# Patient Record
Sex: Male | Born: 1955 | Race: Black or African American | Hispanic: No | Marital: Single | Smoking: Current every day smoker
Health system: Southern US, Community
[De-identification: ages and names within clinical notes are randomized; demographics above are authoritative.]

## PROBLEM LIST (undated history)

## (undated) DIAGNOSIS — F329 Major depressive disorder, single episode, unspecified: Secondary | ICD-10-CM

## (undated) DIAGNOSIS — F4310 Post-traumatic stress disorder, unspecified: Secondary | ICD-10-CM

## (undated) DIAGNOSIS — F32A Depression, unspecified: Secondary | ICD-10-CM

## (undated) DIAGNOSIS — T148XXA Other injury of unspecified body region, initial encounter: Secondary | ICD-10-CM

## (undated) DIAGNOSIS — I639 Cerebral infarction, unspecified: Secondary | ICD-10-CM

## (undated) DIAGNOSIS — G43909 Migraine, unspecified, not intractable, without status migrainosus: Secondary | ICD-10-CM

## (undated) DIAGNOSIS — I10 Essential (primary) hypertension: Secondary | ICD-10-CM

## (undated) HISTORY — PX: HIP SURGERY: SHX245

## (undated) HISTORY — PX: BACK SURGERY: SHX140

## (undated) HISTORY — PX: HEMORROIDECTOMY: SUR656

---

## 1998-04-05 ENCOUNTER — Emergency Department (HOSPITAL_COMMUNITY): Admission: EM | Admit: 1998-04-05 | Discharge: 1998-04-05 | Payer: Self-pay | Admitting: Internal Medicine

## 1998-04-05 ENCOUNTER — Encounter: Payer: Self-pay | Admitting: Internal Medicine

## 1998-07-13 ENCOUNTER — Emergency Department (HOSPITAL_COMMUNITY): Admission: EM | Admit: 1998-07-13 | Discharge: 1998-07-13 | Payer: Self-pay | Admitting: Emergency Medicine

## 1998-08-02 ENCOUNTER — Emergency Department (HOSPITAL_COMMUNITY): Admission: EM | Admit: 1998-08-02 | Discharge: 1998-08-02 | Payer: Self-pay | Admitting: Emergency Medicine

## 1998-08-26 ENCOUNTER — Emergency Department (HOSPITAL_COMMUNITY): Admission: EM | Admit: 1998-08-26 | Discharge: 1998-08-26 | Payer: Self-pay | Admitting: Emergency Medicine

## 1998-09-01 ENCOUNTER — Emergency Department (HOSPITAL_COMMUNITY): Admission: EM | Admit: 1998-09-01 | Discharge: 1998-09-01 | Payer: Self-pay | Admitting: Internal Medicine

## 1998-09-06 ENCOUNTER — Emergency Department (HOSPITAL_COMMUNITY): Admission: EM | Admit: 1998-09-06 | Discharge: 1998-09-06 | Payer: Self-pay | Admitting: Internal Medicine

## 1998-09-06 ENCOUNTER — Encounter: Payer: Self-pay | Admitting: Internal Medicine

## 1999-03-07 ENCOUNTER — Emergency Department (HOSPITAL_COMMUNITY): Admission: EM | Admit: 1999-03-07 | Discharge: 1999-03-07 | Payer: Self-pay | Admitting: Emergency Medicine

## 1999-06-24 ENCOUNTER — Emergency Department (HOSPITAL_COMMUNITY): Admission: EM | Admit: 1999-06-24 | Discharge: 1999-06-24 | Payer: Self-pay | Admitting: Emergency Medicine

## 1999-06-24 ENCOUNTER — Encounter: Payer: Self-pay | Admitting: Emergency Medicine

## 1999-10-13 ENCOUNTER — Emergency Department (HOSPITAL_COMMUNITY): Admission: EM | Admit: 1999-10-13 | Discharge: 1999-10-13 | Payer: Self-pay | Admitting: Emergency Medicine

## 1999-10-13 ENCOUNTER — Encounter: Payer: Self-pay | Admitting: Emergency Medicine

## 2000-07-13 ENCOUNTER — Emergency Department (HOSPITAL_COMMUNITY): Admission: EM | Admit: 2000-07-13 | Discharge: 2000-07-13 | Payer: Self-pay | Admitting: Emergency Medicine

## 2000-10-26 ENCOUNTER — Emergency Department (HOSPITAL_COMMUNITY): Admission: EM | Admit: 2000-10-26 | Discharge: 2000-10-27 | Payer: Self-pay | Admitting: Emergency Medicine

## 2000-10-27 ENCOUNTER — Encounter: Payer: Self-pay | Admitting: Emergency Medicine

## 2000-11-08 ENCOUNTER — Emergency Department (HOSPITAL_COMMUNITY): Admission: EM | Admit: 2000-11-08 | Discharge: 2000-11-08 | Payer: Self-pay

## 2000-12-18 ENCOUNTER — Emergency Department (HOSPITAL_COMMUNITY): Admission: EM | Admit: 2000-12-18 | Discharge: 2000-12-18 | Payer: Self-pay | Admitting: Internal Medicine

## 2001-04-26 ENCOUNTER — Emergency Department (HOSPITAL_COMMUNITY): Admission: EM | Admit: 2001-04-26 | Discharge: 2001-04-26 | Payer: Self-pay | Admitting: Emergency Medicine

## 2001-04-26 ENCOUNTER — Encounter: Payer: Self-pay | Admitting: Emergency Medicine

## 2001-04-27 ENCOUNTER — Emergency Department (HOSPITAL_COMMUNITY): Admission: EM | Admit: 2001-04-27 | Discharge: 2001-04-27 | Payer: Self-pay | Admitting: Emergency Medicine

## 2001-05-01 ENCOUNTER — Emergency Department (HOSPITAL_COMMUNITY): Admission: EM | Admit: 2001-05-01 | Discharge: 2001-05-01 | Payer: Self-pay | Admitting: Emergency Medicine

## 2001-05-01 ENCOUNTER — Encounter: Payer: Self-pay | Admitting: Emergency Medicine

## 2001-06-04 ENCOUNTER — Emergency Department (HOSPITAL_COMMUNITY): Admission: EM | Admit: 2001-06-04 | Discharge: 2001-06-04 | Payer: Self-pay | Admitting: Emergency Medicine

## 2001-06-04 ENCOUNTER — Encounter: Payer: Self-pay | Admitting: Psychology

## 2002-01-02 ENCOUNTER — Emergency Department (HOSPITAL_COMMUNITY): Admission: EM | Admit: 2002-01-02 | Discharge: 2002-01-02 | Payer: Self-pay | Admitting: Emergency Medicine

## 2002-09-08 ENCOUNTER — Emergency Department (HOSPITAL_COMMUNITY): Admission: EM | Admit: 2002-09-08 | Discharge: 2002-09-09 | Payer: Self-pay | Admitting: Emergency Medicine

## 2002-10-23 ENCOUNTER — Emergency Department (HOSPITAL_COMMUNITY): Admission: EM | Admit: 2002-10-23 | Discharge: 2002-10-23 | Payer: Self-pay | Admitting: Emergency Medicine

## 2003-01-28 ENCOUNTER — Emergency Department (HOSPITAL_COMMUNITY): Admission: EM | Admit: 2003-01-28 | Discharge: 2003-01-28 | Payer: Self-pay | Admitting: Emergency Medicine

## 2003-10-19 ENCOUNTER — Encounter: Admission: RE | Admit: 2003-10-19 | Discharge: 2003-10-19 | Payer: Self-pay | Admitting: Internal Medicine

## 2003-12-11 ENCOUNTER — Emergency Department (HOSPITAL_COMMUNITY): Admission: EM | Admit: 2003-12-11 | Discharge: 2003-12-11 | Payer: Self-pay | Admitting: Emergency Medicine

## 2004-01-20 ENCOUNTER — Encounter: Admission: RE | Admit: 2004-01-20 | Discharge: 2004-01-20 | Payer: Self-pay | Admitting: Specialist

## 2004-03-05 ENCOUNTER — Emergency Department (HOSPITAL_COMMUNITY): Admission: EM | Admit: 2004-03-05 | Discharge: 2004-03-05 | Payer: Self-pay | Admitting: Emergency Medicine

## 2004-04-07 ENCOUNTER — Emergency Department (HOSPITAL_COMMUNITY): Admission: EM | Admit: 2004-04-07 | Discharge: 2004-04-07 | Payer: Self-pay | Admitting: Emergency Medicine

## 2004-05-18 ENCOUNTER — Emergency Department (HOSPITAL_COMMUNITY): Admission: EM | Admit: 2004-05-18 | Discharge: 2004-05-18 | Payer: Self-pay | Admitting: Emergency Medicine

## 2004-05-23 ENCOUNTER — Emergency Department (HOSPITAL_COMMUNITY): Admission: EM | Admit: 2004-05-23 | Discharge: 2004-05-23 | Payer: Self-pay | Admitting: Emergency Medicine

## 2004-08-16 ENCOUNTER — Emergency Department (HOSPITAL_COMMUNITY): Admission: EM | Admit: 2004-08-16 | Discharge: 2004-08-16 | Payer: Self-pay | Admitting: Emergency Medicine

## 2004-08-17 ENCOUNTER — Emergency Department (HOSPITAL_COMMUNITY): Admission: EM | Admit: 2004-08-17 | Discharge: 2004-08-17 | Payer: Self-pay | Admitting: Emergency Medicine

## 2004-08-18 ENCOUNTER — Encounter: Admission: RE | Admit: 2004-08-18 | Discharge: 2004-08-18 | Payer: Self-pay | Admitting: Specialist

## 2004-08-24 ENCOUNTER — Encounter: Admission: RE | Admit: 2004-08-24 | Discharge: 2004-08-24 | Payer: Self-pay | Admitting: Specialist

## 2004-08-28 ENCOUNTER — Emergency Department (HOSPITAL_COMMUNITY): Admission: EM | Admit: 2004-08-28 | Discharge: 2004-08-28 | Payer: Self-pay | Admitting: Emergency Medicine

## 2004-11-20 ENCOUNTER — Emergency Department (HOSPITAL_COMMUNITY): Admission: EM | Admit: 2004-11-20 | Discharge: 2004-11-20 | Payer: Self-pay | Admitting: Emergency Medicine

## 2005-02-10 ENCOUNTER — Inpatient Hospital Stay (HOSPITAL_COMMUNITY): Admission: RE | Admit: 2005-02-10 | Discharge: 2005-02-13 | Payer: Self-pay | Admitting: Specialist

## 2005-03-28 ENCOUNTER — Emergency Department (HOSPITAL_COMMUNITY): Admission: EM | Admit: 2005-03-28 | Discharge: 2005-03-28 | Payer: Self-pay | Admitting: Emergency Medicine

## 2005-05-29 ENCOUNTER — Emergency Department (HOSPITAL_COMMUNITY): Admission: EM | Admit: 2005-05-29 | Discharge: 2005-05-29 | Payer: Self-pay | Admitting: Emergency Medicine

## 2005-12-22 ENCOUNTER — Emergency Department (HOSPITAL_COMMUNITY): Admission: EM | Admit: 2005-12-22 | Discharge: 2005-12-22 | Payer: Self-pay | Admitting: Emergency Medicine

## 2006-04-16 ENCOUNTER — Emergency Department (HOSPITAL_COMMUNITY): Admission: EM | Admit: 2006-04-16 | Discharge: 2006-04-16 | Payer: Self-pay | Admitting: Emergency Medicine

## 2007-11-14 ENCOUNTER — Emergency Department (HOSPITAL_COMMUNITY): Admission: EM | Admit: 2007-11-14 | Discharge: 2007-11-14 | Payer: Self-pay | Admitting: Emergency Medicine

## 2007-11-15 ENCOUNTER — Ambulatory Visit (HOSPITAL_COMMUNITY): Admission: RE | Admit: 2007-11-15 | Discharge: 2007-11-15 | Payer: Self-pay | Admitting: Emergency Medicine

## 2009-08-05 ENCOUNTER — Inpatient Hospital Stay (HOSPITAL_COMMUNITY): Admission: EM | Admit: 2009-08-05 | Discharge: 2009-08-12 | Payer: Self-pay | Admitting: Emergency Medicine

## 2009-08-06 ENCOUNTER — Ambulatory Visit: Payer: Self-pay | Admitting: Physical Medicine & Rehabilitation

## 2009-08-06 ENCOUNTER — Encounter (INDEPENDENT_AMBULATORY_CARE_PROVIDER_SITE_OTHER): Payer: Self-pay | Admitting: Internal Medicine

## 2009-08-06 ENCOUNTER — Ambulatory Visit: Payer: Self-pay | Admitting: Vascular Surgery

## 2009-08-08 ENCOUNTER — Encounter (INDEPENDENT_AMBULATORY_CARE_PROVIDER_SITE_OTHER): Payer: Self-pay | Admitting: Internal Medicine

## 2009-12-07 ENCOUNTER — Encounter: Payer: Self-pay | Admitting: Interventional Radiology

## 2010-01-06 ENCOUNTER — Encounter
Admission: RE | Admit: 2010-01-06 | Discharge: 2010-04-06 | Payer: Self-pay | Source: Home / Self Care | Admitting: Internal Medicine

## 2010-06-20 ENCOUNTER — Encounter: Payer: Self-pay | Admitting: Internal Medicine

## 2010-08-23 LAB — LIPID PANEL
Cholesterol: 163 mg/dL (ref 0–200)
HDL: 24 mg/dL — ABNORMAL LOW (ref >39)
Total CHOL/HDL Ratio: 6.8 RATIO
VLDL: 25 mg/dL (ref 0–40)

## 2010-08-23 LAB — BASIC METABOLIC PANEL
Calcium: 9.5 mg/dL (ref 8.4–10.5)
Creatinine, Ser: 1.21 mg/dL (ref 0.4–1.5)
GFR calc Af Amer: >60 mL/min (ref >60)
GFR calc non Af Amer: >60 mL/min (ref >60)
Sodium: 141 mEq/L (ref 135–145)

## 2010-08-23 LAB — HEMOGLOBIN A1C
Hgb A1c MFr Bld: 5.7 % (ref 4.6–6.1)
Mean Plasma Glucose: 117 mg/dL

## 2010-08-23 LAB — COMPREHENSIVE METABOLIC PANEL
ALT: 15 U/L (ref 0–53)
ALT: 20 U/L (ref 0–53)
AST: 28 U/L (ref 0–37)
Albumin: 3.6 g/dL (ref 3.5–5.2)
Alkaline Phosphatase: 100 U/L (ref 39–117)
Alkaline Phosphatase: 84 U/L (ref 39–117)
BUN: 8 mg/dL (ref 6–23)
CO2: 22 mEq/L (ref 19–32)
Calcium: 9.1 mg/dL (ref 8.4–10.5)
Chloride: 108 mEq/L (ref 96–112)
Creatinine, Ser: 1.07 mg/dL (ref 0.4–1.5)
GFR calc Af Amer: >60 mL/min (ref >60)
GFR calc non Af Amer: >60 mL/min (ref >60)
Glucose, Bld: 120 mg/dL — ABNORMAL HIGH (ref 70–99)
Potassium: 3.7 mEq/L (ref 3.5–5.1)
Potassium: 4.2 mEq/L (ref 3.5–5.1)
Sodium: 139 mEq/L (ref 135–145)
Sodium: 142 mEq/L (ref 135–145)
Total Protein: 6.2 g/dL (ref 6.0–8.3)

## 2010-08-23 LAB — CK TOTAL AND CKMB (NOT AT ARMC)
CK, MB: 0.8 ng/mL (ref 0.3–4.0)
Relative Index: 0.5 (ref 0.0–2.5)
Total CK: 164 U/L (ref 7–232)

## 2010-08-23 LAB — DIFFERENTIAL
Basophils Absolute: 0.1 10*3/uL (ref 0.0–0.1)
Basophils Relative: 1 % (ref 0–1)
Eosinophils Relative: 4 % (ref 0–5)
Monocytes Absolute: 0.7 10*3/uL (ref 0.1–1.0)

## 2010-08-23 LAB — TROPONIN I: Troponin I: <0.01 ng/mL (ref 0.00–0.06)

## 2010-08-23 LAB — CBC
HCT: 44.7 % (ref 39.0–52.0)
Hemoglobin: 14.7 g/dL (ref 13.0–17.0)
Hemoglobin: 16.7 g/dL (ref 13.0–17.0)
MCHC: 33.9 g/dL (ref 30.0–36.0)
MCV: 96.2 fL (ref 78.0–100.0)
RBC: 4.42 MIL/uL (ref 4.22–5.81)
RBC: 5.12 MIL/uL (ref 4.22–5.81)
RDW: 14.2 % (ref 11.5–15.5)
WBC: 6.2 10*3/uL (ref 4.0–10.5)

## 2010-08-23 LAB — RPR: RPR Ser Ql: NONREACTIVE

## 2010-08-23 LAB — TSH: TSH: 1.128 u[IU]/mL (ref 0.350–4.500)

## 2010-08-23 LAB — HIV ANTIBODY (ROUTINE TESTING W REFLEX): HIV: NONREACTIVE

## 2010-08-23 LAB — GLUCOSE, CAPILLARY
Glucose-Capillary: 107 mg/dL — ABNORMAL HIGH (ref 70–99)
Glucose-Capillary: 112 mg/dL — ABNORMAL HIGH (ref 70–99)

## 2010-08-23 LAB — APTT: aPTT: 26 seconds (ref 24–37)

## 2010-08-23 LAB — PROTIME-INR
INR: 0.92 (ref 0.00–1.49)
Prothrombin Time: 12.3 seconds (ref 11.6–15.2)

## 2010-08-23 LAB — HOMOCYSTEINE: Homocysteine: 14.7 umol/L (ref 4.0–15.4)

## 2010-08-23 LAB — GC/CHLAMYDIA PROBE AMP, URINE: Chlamydia, Swab/Urine, PCR: NEGATIVE

## 2010-10-15 NOTE — Op Note (Signed)
NAMEABDULAHI, SCHOR NO.:  [id number]   MEDICAL RECORD NO.:  [id number]          PATIENT TYPE:  OIB   LOCATION:  1510                         FACILITY:  [facility]   PHYSICIAN:  Jene Every, M.D.    DATE OF BIRTH:  1956-02-25   DATE OF PROCEDURE:  02/10/2005  DATE OF DISCHARGE:                                 OPERATIVE REPORT   PREOPERATIVE DIAGNOSIS:  Spinal stenosis at 4-5.   POSTOPERATIVE DIAGNOSIS:  Spinal stenosis at 4-5.   PROCEDURE PERFORMED:  1.  Central decompression at 4-5 with bilateral lateral recess decompression      and foraminotomies at L4 and L5.  2.  Decompression at 3-4.   ANESTHESIA:  General.   SURGEON:  Jene Every, M.D.   ASSISTANT:  Roma Schanz, P.A.   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  He had an MRI and a myelogram recently demonstrating severe stenosis  at 4-5 and moderate at 3-4.  He was considered a candidate for a __________  , however, he recently developed pain in the seated as well as the standing  position therefore precluding that option.  Therefore, it was felt that  decompression at 4-5 was appropriate.  He had a previous disk protrusion at  4-5 but it was felt that due to his retrolisthesis that this was a  pseudodisc.  Operative intervention was indicated for central decompression  to adjust the severe stenosis at 4-5.  The risks and benefits were discussed  including bleeding, infection, CSF leakage, epidural fibrous disease,  progression of disease and need for fusion in the future, anesthetic  complications and he accepted.   The patient was placed in the supine after the induction of general  anesthesia and 1 gm of Kefzol.  He was placed prone on the Wilson frame,  Gratiot table.  All bony prominences were well padded.  All private regions  were padded.  The lumbar region was prepped  and draped in the usual sterile  fashion.  Two #18 gauge spinal needles were utilized to localize the 4-5  interspace and confirmed with x-ray.  Incision was made from the spinous  process of 3 to the spinous process of 5.  The subcutaneous tissues were  dissected.  Electrocautery was utilized to achieve hemostasis.  The  dorsolumbar fascia was identified in the bilateral skin incision.  The  paraspinous muscles were elevated to perform an island at 4-5 and partially  at 3.  A Cole retractor was placed, Kocher's were placed in the spinous  process of 4-5, confirmed with x-ray.  The spinous process of 4 and  partially of 5 was removed with the Leksell rongeur.  I used a 2 mm Kerrison  to enter the interlaminar space.  Severe central stenosis was noted.  We  removed the partial lamina of 4 with Leksell rongeur and then completed with  a 2 mm Kerrison and eventually the full lamina of 4 due to some severe  stenosis bilaterally and centrally.  The operating  microscope had been  draped and brought into the surgical field.  There was noted even with the  laminectomy at 4 that the ligamentum flavum was hypertrophied and stenotic  and 3-4 so we removed the ligamentum flavum from the 3-4 space as well.  Distally there was severe foraminal stenosis at 4-5 so foraminotomies at 5  were performed.  Hemilaminotomies bilaterally at 5 were performed as well to  fully decompression 4-5.  After the decompression there was good restoration  of the thecal sac.  We did not use any significant retraction of the thecal  sac.  The foramen of 4 and 5 were widely patent and there was a hard disk at  4-5 which was a pseudodisc with retrolisthesis, not ruptured and this was  examined on both sides at the right and to the left.  Bipolar electrocautery  was utilized to achieve hemostasis.  Following the full decompression there  was no CSF leakage or active bleeding.  Bone wax had been placed and the  cancellous  surfaces were copiously irrigated with antibiotic irrigation.  Thrombin-soaked was placed in the laminotomy defects.  Next a McCullough  retractor was placed in the paraspinous muscles to inspect and there was no  bleeding.  Hemovac was placed and brought out through a stab wound in the  skin.  The dorsolumbar fascia was reapproximated with #1 Vicryl interrupted  figure-of-eight sutures.  The subcutaneous tissues were reapproximated with  2-0 Vicryl subcuticular sutures and the skin was reapproximated with  staples.  The wound was dressed sterilely and he was placed supine on the  hospital bed, extubated without difficulty and transported to the recovery  room in satisfactory condition.   COMPLICATIONS:  None.   ESTIMATED BLOOD LOSS:  100 cc.      Jene Every, M.D.  Electronically Signed     JB/MEDQ  D:  02/10/2005  T:  02/10/2005  Job:  [id number]

## 2011-05-25 ENCOUNTER — Emergency Department (HOSPITAL_BASED_OUTPATIENT_CLINIC_OR_DEPARTMENT_OTHER)
Admission: EM | Admit: 2011-05-25 | Discharge: 2011-05-25 | Disposition: A | Discharge status: Home / Self Care | Payer: Medicaid Other | Attending: Emergency Medicine | Admitting: Emergency Medicine

## 2011-05-25 ENCOUNTER — Emergency Department (INDEPENDENT_AMBULATORY_CARE_PROVIDER_SITE_OTHER): Payer: Medicaid Other

## 2011-05-25 ENCOUNTER — Encounter: Payer: Self-pay | Admitting: *Deleted

## 2011-05-25 DIAGNOSIS — R05 Cough: Secondary | ICD-10-CM

## 2011-05-25 DIAGNOSIS — F17200 Nicotine dependence, unspecified, uncomplicated: Secondary | ICD-10-CM | POA: Insufficient documentation

## 2011-05-25 DIAGNOSIS — B9789 Other viral agents as the cause of diseases classified elsewhere: Secondary | ICD-10-CM | POA: Insufficient documentation

## 2011-05-25 DIAGNOSIS — M549 Dorsalgia, unspecified: Secondary | ICD-10-CM | POA: Insufficient documentation

## 2011-05-25 DIAGNOSIS — J3489 Other specified disorders of nose and nasal sinuses: Secondary | ICD-10-CM

## 2011-05-25 DIAGNOSIS — I10 Essential (primary) hypertension: Secondary | ICD-10-CM | POA: Insufficient documentation

## 2011-05-25 DIAGNOSIS — B349 Viral infection, unspecified: Secondary | ICD-10-CM

## 2011-05-25 DIAGNOSIS — Z8679 Personal history of other diseases of the circulatory system: Secondary | ICD-10-CM | POA: Insufficient documentation

## 2011-05-25 DIAGNOSIS — R059 Cough, unspecified: Secondary | ICD-10-CM | POA: Insufficient documentation

## 2011-05-25 HISTORY — DX: Essential (primary) hypertension: I10

## 2011-05-25 HISTORY — DX: Cerebral infarction, unspecified: I63.9

## 2011-05-25 MED ORDER — ALBUTEROL SULFATE HFA 108 (90 BASE) MCG/ACT IN AERS
2.0000 | INHALATION_SPRAY | Freq: Once | RESPIRATORY_TRACT | Status: AC
  Administered 2011-05-25: 2 via RESPIRATORY_TRACT
  Filled 2011-05-25: qty 6.7

## 2011-05-25 NOTE — ED Notes (Signed)
Pt c/o lower back  pain and cough and sinus congestion x 3 days

## 2011-05-25 NOTE — ED Provider Notes (Signed)
History     CSN: [id number]  Arrival date & time 05/25/11  4696   First MD Initiated Contact with Patient 05/25/11 2111     HPI  Reports one week of cough chest congestion. Associated with sinus congestion, and rhinorrhea which has since resolved. Reports positive sick contact as he is here with his daughter who had similar symptoms for approximately one week. Denies chest pain, shortness breath, fever, back, nausea, vomiting, diarrhea, abdominal pain. Patient is a 55 y.o. male presenting with cough. The history is provided by the patient.  Cough This is a new problem. The current episode started more than 1 week ago. The problem occurs constantly. The cough is non-productive. There has been no fever. Pertinent negatives include no chest pain, no chills, no ear pain, no headaches, no rhinorrhea, no sore throat, no myalgias and no shortness of breath. He has tried decongestants for the symptoms. The treatment provided no relief. He is a smoker. His past medical history does not include bronchitis, COPD or asthma.    Past Medical History  Diagnosis Date  . Hypertension   . CVA (cerebral infarction)     Past Surgical History  Procedure Date  . Back surgery   . Hemorroidectomy     History reviewed. No pertinent family history.  History  Substance Use Topics  . Smoking status: Current Everyday Smoker -- 0.5 packs/day    Types: Cigarettes  . Smokeless tobacco: Not on file  . Alcohol Use: No      Review of Systems  Constitutional: Positive for fatigue. Negative for fever and chills.  HENT: Positive for congestion. Negative for ear pain, sore throat, rhinorrhea, drooling, postnasal drip and sinus pressure.   Respiratory: Positive for cough. Negative for shortness of breath.   Cardiovascular: Negative for chest pain.  Gastrointestinal: Negative for nausea, vomiting, abdominal pain and diarrhea.  Musculoskeletal: Negative for myalgias and back pain.  Neurological: Negative for  dizziness, weakness and headaches.  All other systems reviewed and are negative.    Allergies  Review of patient's allergies indicates no known allergies.  Home Medications   Current Outpatient Rx  Name Route Sig Dispense Refill  . ASPIRIN EC 81 MG PO TBEC Oral Take 81 mg by mouth daily.      Marland Kitchen HYDROCHLOROTHIAZIDE 25 MG PO TABS Oral Take 25 mg by mouth daily.      Marland Kitchen HYDROCODONE-ACETAMINOPHEN 5-500 MG PO TABS Oral Take 1 tablet by mouth daily as needed. For pain     . IBUPROFEN 800 MG PO TABS Oral Take 800 mg by mouth daily.      Marland Kitchen VERAPAMIL HCL 180 MG PO CP24 Oral Take 180 mg by mouth at bedtime.        BP 155/98  Pulse 89  Temp(Src) 98.5 F (36.9 C) (Oral)  Resp 16  Ht 6\' 1"  (1.854 m)  Wt 214 lb (97.07 kg)  BMI 28.23 kg/m2  SpO2 100%  Physical Exam  Constitutional: He is oriented to person, place, and time. He appears well-developed and well-nourished.  HENT:  Head: Normocephalic and atraumatic.  Right Ear: External ear normal.  Left Ear: External ear normal.  Nose: Nose normal.  Mouth/Throat: Oropharynx is clear and moist. No oropharyngeal exudate.  Eyes: Conjunctivae are normal. Pupils are equal, round, and reactive to light.  Neck: Normal range of motion. Neck supple. No thyromegaly present.  Cardiovascular: Normal rate, regular rhythm and normal heart sounds.  Exam reveals no gallop and no friction rub.  No murmur heard. Pulmonary/Chest: Effort normal and breath sounds normal. He has no wheezes. He has no rales. He exhibits no tenderness.  Abdominal: Soft. Bowel sounds are normal. He exhibits no distension and no mass. There is no tenderness. There is no rebound and no guarding.  Neurological: He is alert and oriented to person, place, and time.  Skin: Skin is warm and dry. No rash noted. No erythema. No pallor.  Psychiatric: He has a normal mood and affect. His behavior is normal.    ED Course  Procedures   MDM  We'll treat as a viral infection. However  advised return for worsening symptoms shortness of breath, chest pain, fever. Patient and  family agreeable and are ready for discharge      Thomasene Lot, [place] 05/25/11 2220

## 2011-05-25 NOTE — Patient Instructions (Signed)
Instructed pt on the proper use of administering albuteral mdi via aerochamber pt tolerated well 

## 2011-05-26 NOTE — ED Provider Notes (Signed)
Medical screening examination/treatment/procedure(s) were performed by non-physician practitioner and as supervising physician I was immediately available for consultation/collaboration.  Doug Sou, MD 05/26/11 [phone]

## 2011-06-12 ENCOUNTER — Emergency Department (INDEPENDENT_AMBULATORY_CARE_PROVIDER_SITE_OTHER): Payer: Medicaid Other

## 2011-06-12 ENCOUNTER — Encounter (HOSPITAL_BASED_OUTPATIENT_CLINIC_OR_DEPARTMENT_OTHER): Payer: Self-pay

## 2011-06-12 ENCOUNTER — Emergency Department (HOSPITAL_BASED_OUTPATIENT_CLINIC_OR_DEPARTMENT_OTHER)
Admission: EM | Admit: 2011-06-12 | Discharge: 2011-06-12 | Disposition: A | Discharge status: Home / Self Care | Payer: Medicaid Other | Attending: Emergency Medicine | Admitting: Emergency Medicine

## 2011-06-12 DIAGNOSIS — R05 Cough: Secondary | ICD-10-CM | POA: Insufficient documentation

## 2011-06-12 DIAGNOSIS — M79609 Pain in unspecified limb: Secondary | ICD-10-CM | POA: Insufficient documentation

## 2011-06-12 DIAGNOSIS — R059 Cough, unspecified: Secondary | ICD-10-CM | POA: Insufficient documentation

## 2011-06-12 DIAGNOSIS — Z79899 Other long term (current) drug therapy: Secondary | ICD-10-CM | POA: Insufficient documentation

## 2011-06-12 DIAGNOSIS — Z8679 Personal history of other diseases of the circulatory system: Secondary | ICD-10-CM | POA: Insufficient documentation

## 2011-06-12 DIAGNOSIS — R112 Nausea with vomiting, unspecified: Secondary | ICD-10-CM

## 2011-06-12 DIAGNOSIS — M25559 Pain in unspecified hip: Secondary | ICD-10-CM

## 2011-06-12 DIAGNOSIS — R509 Fever, unspecified: Secondary | ICD-10-CM

## 2011-06-12 DIAGNOSIS — R0989 Other specified symptoms and signs involving the circulatory and respiratory systems: Secondary | ICD-10-CM

## 2011-06-12 DIAGNOSIS — J40 Bronchitis, not specified as acute or chronic: Secondary | ICD-10-CM | POA: Insufficient documentation

## 2011-06-12 DIAGNOSIS — I10 Essential (primary) hypertension: Secondary | ICD-10-CM | POA: Insufficient documentation

## 2011-06-12 MED ORDER — ALBUTEROL SULFATE (5 MG/ML) 0.5% IN NEBU
5.0000 mg | INHALATION_SOLUTION | Freq: Once | RESPIRATORY_TRACT | Status: AC
  Administered 2011-06-12: 5 mg via RESPIRATORY_TRACT

## 2011-06-12 MED ORDER — IPRATROPIUM BROMIDE 0.02 % IN SOLN
RESPIRATORY_TRACT | Status: AC
  Filled 2011-06-12: qty 2.5

## 2011-06-12 MED ORDER — KETOROLAC TROMETHAMINE 60 MG/2ML IM SOLN
60.0000 mg | Freq: Once | INTRAMUSCULAR | Status: AC
  Administered 2011-06-12: 60 mg via INTRAMUSCULAR
  Filled 2011-06-12: qty 2

## 2011-06-12 MED ORDER — ALBUTEROL SULFATE (5 MG/ML) 0.5% IN NEBU
INHALATION_SOLUTION | RESPIRATORY_TRACT | Status: AC
  Administered 2011-06-12: 5 mg via RESPIRATORY_TRACT
  Filled 2011-06-12: qty 1

## 2011-06-12 MED ORDER — IPRATROPIUM BROMIDE 0.02 % IN SOLN
0.5000 mg | Freq: Once | RESPIRATORY_TRACT | Status: AC
  Administered 2011-06-12: 0.5 mg via RESPIRATORY_TRACT

## 2011-06-12 MED ORDER — ALBUTEROL SULFATE (5 MG/ML) 0.5% IN NEBU
INHALATION_SOLUTION | RESPIRATORY_TRACT | Status: AC
  Filled 2011-06-12: qty 1

## 2011-06-12 MED ORDER — IPRATROPIUM BROMIDE 0.02 % IN SOLN
RESPIRATORY_TRACT | Status: AC
  Administered 2011-06-12: 0.5 mg via RESPIRATORY_TRACT
  Filled 2011-06-12: qty 2.5

## 2011-06-12 MED ORDER — ALBUTEROL SULFATE HFA 108 (90 BASE) MCG/ACT IN AERS: 1.0000 | INHALATION_SPRAY | Freq: Four times a day (QID) | RESPIRATORY_TRACT | Status: AC | PRN

## 2011-06-12 MED ORDER — PREDNISONE 50 MG PO TABS: 50.0000 mg | ORAL_TABLET | Freq: Every day | ORAL | Status: DC

## 2011-06-12 MED ORDER — DOXYCYCLINE HYCLATE 100 MG PO CAPS: 100.0000 mg | ORAL_CAPSULE | Freq: Two times a day (BID) | ORAL | Status: AC

## 2011-06-12 NOTE — ED Provider Notes (Signed)
History     CSN: [id number]  Arrival date & time 06/12/11  0407   First MD Initiated Contact with Patient 06/12/11 [phone]      Chief Complaint  Patient presents with  . Influenza    (Consider location/radiation/quality/duration/timing/severity/associated sxs/prior treatment) Patient is a 56 y.o. male presenting with flu symptoms and wheezing. The history is provided by the patient. No language interpreter was used.  Influenza This is a recurrent problem. The current episode started more than 1 week ago. The problem occurs constantly. The problem has not changed since onset.Pertinent negatives include no chest pain, no abdominal pain, no headaches and no shortness of breath. The symptoms are aggravated by nothing. The symptoms are relieved by nothing. He has tried nothing for the symptoms. The treatment provided no relief.  Wheezing  The current episode started more than 2 weeks ago. The onset was gradual. The problem occurs continuously. The problem has been unchanged. The problem is moderate. The symptoms are relieved by rest. Associated symptoms include cough and wheezing. Pertinent negatives include no chest pain, no fever and no shortness of breath.    Past Medical History  Diagnosis Date  . Hypertension   . CVA (cerebral infarction)     Past Surgical History  Procedure Date  . Back surgery   . Hemorroidectomy     History reviewed. No pertinent family history.  History  Substance Use Topics  . Smoking status: Current Everyday Smoker -- 0.5 packs/day    Types: Cigarettes  . Smokeless tobacco: Not on file  . Alcohol Use: No      Review of Systems  Constitutional: Negative for fever.  HENT: Negative for neck stiffness.   Eyes: Negative.   Respiratory: Positive for cough and wheezing. Negative for shortness of breath.   Cardiovascular: Negative for chest pain.  Gastrointestinal: Negative for abdominal pain and abdominal distention.  Genitourinary: Negative.     Musculoskeletal: Negative.   Neurological: Negative for headaches.  Hematological: Negative.   Psychiatric/Behavioral: Negative.     Allergies  Review of patient's allergies indicates no known allergies.  Home Medications   Current Outpatient Rx  Name Route Sig Dispense Refill  . ASPIRIN EC 81 MG PO TBEC Oral Take 81 mg by mouth daily.      Marland Kitchen HYDROCHLOROTHIAZIDE 25 MG PO TABS Oral Take 25 mg by mouth daily.      Marland Kitchen HYDROCODONE-ACETAMINOPHEN 5-500 MG PO TABS Oral Take 1 tablet by mouth daily as needed. For pain     . IBUPROFEN 800 MG PO TABS Oral Take 800 mg by mouth daily.      Marland Kitchen VERAPAMIL HCL 180 MG PO CP24 Oral Take 180 mg by mouth at bedtime.      . ALBUTEROL SULFATE HFA 108 (90 BASE) MCG/ACT IN AERS Inhalation Inhale 1-2 puffs into the lungs every 6 (six) hours as needed for wheezing. 1 Inhaler 0  . DOXYCYCLINE HYCLATE 100 MG PO CAPS Oral Take 1 capsule (100 mg total) by mouth 2 (two) times daily. 14 capsule 0  . PREDNISONE 50 MG PO TABS Oral Take 1 tablet (50 mg total) by mouth daily. 5 tablet 0    BP 134/85  Pulse 93  Temp(Src) 99 F (37.2 C) (Oral)  Resp 17  Ht 6\' 2"  (1.88 m)  Wt 200 lb (90.719 kg)  BMI 25.68 kg/m2  SpO2 96%  Physical Exam  Constitutional: He is oriented to person, place, and time. He appears well-developed and well-nourished. No distress.  HENT:  Head: Normocephalic and atraumatic.  Mouth/Throat: Oropharynx is clear and moist. No oropharyngeal exudate.  Eyes: Conjunctivae are normal. Pupils are equal, round, and reactive to light.  Neck: Normal range of motion. Neck supple.  Cardiovascular: Normal rate and regular rhythm.   Pulmonary/Chest: No stridor. He has wheezes.  Abdominal: Soft. Bowel sounds are normal. There is no tenderness.  Musculoskeletal: Normal range of motion. He exhibits no edema.  Neurological: He is alert and oriented to person, place, and time.  Skin: Skin is warm and dry.  Psychiatric: Thought content normal.    ED Course   Procedures (including critical care time)  Labs Reviewed - No data to display Dg Chest 2 View  06/12/2011  *RADIOLOGY REPORT*  Clinical Data: Cough, fever, nausea, vomiting, congestion, sore throat, hoarseness, and left leg pain.  CHEST - 2 VIEW  Comparison: Chest radiograph performed 05/25/2011  Findings: The lungs are well-aerated and clear.  There is no evidence of focal opacification, pleural effusion or pneumothorax.  The heart is normal in size; the mediastinal contour is within normal limits.  No acute osseous abnormalities are seen.  IMPRESSION: No acute cardiopulmonary process seen.  Original Report Authenticated By: Tonia Ghent, M.D.   Dg Pelvis 1-2 Views  06/12/2011  *RADIOLOGY REPORT*  Clinical Data: Left hip and leg pain.  PELVIS - 1-2 VIEW  Comparison: CT of the lumbar spine performed 08/24/2004  Findings: There is no evidence of fracture or dislocation.  Both femoral heads are seated normally within their respective acetabula.  No significant degenerative change is appreciated.  The sacroiliac joints are unremarkable in appearance.  The visualized bowel gas pattern is grossly unremarkable in appearance.  IMPRESSION: No evidence of fracture or dislocation.  Original Report Authenticated By: Tonia Ghent, M.D.     1. Bronchitis       MDM  Return for chest pain shortness of breath change in cough or any concerns.  Follow up for recheck with your family doctor        Webb Weed K Yandiel Bergum-Rasch, MD 06/12/11 [phone]

## 2011-06-12 NOTE — ED Notes (Signed)
Pt states that for the past month he has had flu symptoms, fever, nausea, vomiting, congestion, sore throat, horse voice, hip and leg pain in the left lower extremity.   No improvement since last visit on 12/26

## 2011-10-12 ENCOUNTER — Ambulatory Visit: Payer: Medicaid Other | Attending: Internal Medicine | Admitting: Rehabilitation

## 2011-10-12 DIAGNOSIS — IMO0001 Reserved for inherently not codable concepts without codable children: Secondary | ICD-10-CM | POA: Insufficient documentation

## 2011-10-12 DIAGNOSIS — M25559 Pain in unspecified hip: Secondary | ICD-10-CM | POA: Insufficient documentation

## 2011-10-12 DIAGNOSIS — R269 Unspecified abnormalities of gait and mobility: Secondary | ICD-10-CM | POA: Insufficient documentation

## 2011-10-18 ENCOUNTER — Ambulatory Visit: Payer: Medicaid Other | Admitting: Physical Therapy

## 2011-10-20 ENCOUNTER — Ambulatory Visit: Payer: Medicaid Other | Admitting: Physical Therapy

## 2011-10-25 ENCOUNTER — Ambulatory Visit: Payer: Medicaid Other | Admitting: Physical Therapy

## 2011-10-27 ENCOUNTER — Ambulatory Visit: Payer: Medicaid Other | Admitting: Physical Therapy

## 2011-11-01 ENCOUNTER — Ambulatory Visit: Payer: Medicaid Other | Attending: Internal Medicine | Admitting: Physical Therapy

## 2011-11-01 DIAGNOSIS — IMO0001 Reserved for inherently not codable concepts without codable children: Secondary | ICD-10-CM | POA: Insufficient documentation

## 2011-11-01 DIAGNOSIS — R269 Unspecified abnormalities of gait and mobility: Secondary | ICD-10-CM | POA: Insufficient documentation

## 2011-11-01 DIAGNOSIS — M25559 Pain in unspecified hip: Secondary | ICD-10-CM | POA: Insufficient documentation

## 2012-01-17 ENCOUNTER — Emergency Department (HOSPITAL_COMMUNITY)
Admission: EM | Admit: 2012-01-17 | Discharge: 2012-01-17 | Disposition: A | Discharge status: Home / Self Care | Payer: Medicaid Other | Attending: Emergency Medicine | Admitting: Emergency Medicine

## 2012-01-17 ENCOUNTER — Encounter (HOSPITAL_COMMUNITY): Payer: Self-pay | Admitting: *Deleted

## 2012-01-17 DIAGNOSIS — R209 Unspecified disturbances of skin sensation: Secondary | ICD-10-CM | POA: Insufficient documentation

## 2012-01-17 DIAGNOSIS — I10 Essential (primary) hypertension: Secondary | ICD-10-CM | POA: Insufficient documentation

## 2012-01-17 DIAGNOSIS — F17200 Nicotine dependence, unspecified, uncomplicated: Secondary | ICD-10-CM | POA: Insufficient documentation

## 2012-01-17 DIAGNOSIS — R5383 Other fatigue: Secondary | ICD-10-CM | POA: Insufficient documentation

## 2012-01-17 DIAGNOSIS — Z8673 Personal history of transient ischemic attack (TIA), and cerebral infarction without residual deficits: Secondary | ICD-10-CM | POA: Insufficient documentation

## 2012-01-17 DIAGNOSIS — R5381 Other malaise: Secondary | ICD-10-CM | POA: Insufficient documentation

## 2012-01-17 DIAGNOSIS — B353 Tinea pedis: Secondary | ICD-10-CM | POA: Insufficient documentation

## 2012-01-17 DIAGNOSIS — R202 Paresthesia of skin: Secondary | ICD-10-CM

## 2012-01-17 DIAGNOSIS — R51 Headache: Secondary | ICD-10-CM | POA: Insufficient documentation

## 2012-01-17 LAB — CBC WITH DIFFERENTIAL/PLATELET
Basophils Absolute: 0 10*3/uL (ref 0.0–0.1)
HCT: 42.3 % (ref 39.0–52.0)
Hemoglobin: 15.2 g/dL (ref 13.0–17.0)
Lymphocytes Relative: 48 % — ABNORMAL HIGH (ref 12–46)
Lymphs Abs: 3.2 10*3/uL (ref 0.7–4.0)
MCV: 91.2 fL (ref 78.0–100.0)
Monocytes Absolute: 0.9 10*3/uL (ref 0.1–1.0)
Monocytes Relative: 13 % — ABNORMAL HIGH (ref 3–12)
Neutro Abs: 2.3 10*3/uL (ref 1.7–7.7)
RBC: 4.64 MIL/uL (ref 4.22–5.81)
RDW: 14.7 % (ref 11.5–15.5)
WBC: 6.6 10*3/uL (ref 4.0–10.5)

## 2012-01-17 LAB — POCT I-STAT, CHEM 8
BUN: 14 mg/dL (ref 6–23)
Calcium, Ion: 1.21 mmol/L (ref 1.12–1.23)
Chloride: 109 mEq/L (ref 96–112)
Creatinine, Ser: 1.1 mg/dL (ref 0.50–1.35)

## 2012-01-17 MED ORDER — TERBINAFINE 1 % EX GEL: 0.2500 g | Freq: Two times a day (BID) | CUTANEOUS | Status: DC

## 2012-01-17 NOTE — ED Notes (Signed)
Pt. Has c/o vision changes,  "strange feeling going up the back of his neck, weakness, and bilateral lower extremity swelling.  Pt. Has c/o coughing a little and headache.

## 2012-01-17 NOTE — ED Provider Notes (Signed)
History     CSN: [id number]  Arrival date & time 01/17/12  4098   First MD Initiated Contact with Patient 01/17/12 [phone]      Chief Complaint  Patient presents with  . Weakness  . Leg Swelling  . Headache  . Eye Problem    (Consider location/radiation/quality/duration/timing/severity/associated sxs/prior treatment) HPI Comments: Patient reports numbness and tingling in bilateral hands and feet. He reports the symptoms started gradually 2 months ago, started and progressed equally on both sides. He reports intermittent numbness and tingling of his extremities. He reports bilateral feet swelling as well. He denies back pain, headache, neck pain, chest pain, shortness of breath. He reports a history of a "small stroke" in 2011 without residual neurologic deficits. He also reports a history of back surgery in 2006.   Patient is a 56 y.o. male presenting with weakness, headaches, and eye problem.  Weakness Primary symptoms do not include headaches, dizziness, fever, nausea or vomiting.  Additional symptoms include weakness. Additional symptoms do not include neck stiffness.  Headache  Pertinent negatives include no fever, no shortness of breath, no nausea and no vomiting.  Eye Problem  Associated symptoms include numbness and weakness. Pertinent negatives include no nausea and no vomiting.    Past Medical History  Diagnosis Date  . Hypertension   . CVA (cerebral infarction)     Past Surgical History  Procedure Date  . Back surgery   . Hemorroidectomy     History reviewed. No pertinent family history.  History  Substance Use Topics  . Smoking status: Current Everyday Smoker -- 0.5 packs/day    Types: Cigarettes  . Smokeless tobacco: Not on file  . Alcohol Use: No      Review of Systems  Constitutional: Negative for fever, chills, diaphoresis and fatigue.  HENT: Negative for trouble swallowing, neck pain and neck stiffness.   Eyes: Negative for visual disturbance.    Respiratory: Negative for cough, chest tightness and shortness of breath.   Cardiovascular: Negative for chest pain.  Gastrointestinal: Negative for nausea, vomiting, abdominal pain and diarrhea.  Musculoskeletal: Positive for back pain. Negative for gait problem.  Skin: Negative for rash and wound.  Neurological: Positive for weakness and numbness. Negative for dizziness and headaches.    Allergies  Review of patient's allergies indicates no known allergies.  Home Medications   Current Outpatient Rx  Name Route Sig Dispense Refill  . ASPIRIN EC 81 MG PO TBEC Oral Take 81 mg by mouth daily.      Marland Kitchen HYDROCHLOROTHIAZIDE 25 MG PO TABS Oral Take 25 mg by mouth daily.      Marland Kitchen HYDROCODONE-ACETAMINOPHEN 5-500 MG PO TABS Oral Take 2 tablets by mouth daily as needed. For pain    . IBUPROFEN 400 MG PO TABS Oral Take 400 mg by mouth every 6 (six) hours as needed. For pain    . VERAPAMIL HCL 180 MG PO CP24 Oral Take 180 mg by mouth at bedtime.      . ALBUTEROL SULFATE HFA 108 (90 BASE) MCG/ACT IN AERS Inhalation Inhale 1-2 puffs into the lungs every 6 (six) hours as needed for wheezing. 1 Inhaler 0    BP 145/94  Pulse 65  Temp 98.5 F (36.9 C) (Oral)  Resp 17  SpO2 100%  Physical Exam  Nursing note and vitals reviewed. Constitutional: He is oriented to person, place, and time. He appears well-developed and well-nourished. No distress.  HENT:  Head: Normocephalic and atraumatic.  Mouth/Throat: No oropharyngeal exudate.  Eyes: Conjunctivae are normal. No scleral icterus.  Neck: Normal range of motion.  Cardiovascular: Normal rate and regular rhythm.  Exam reveals no gallop and no friction rub.   No murmur heard.      Sufficient capillary refill. No lower extremity edema noted.   Pulmonary/Chest: Effort normal and breath sounds normal. No respiratory distress. He has no wheezes. He has no rales. He exhibits no tenderness.  Abdominal: Soft. There is no tenderness.  Musculoskeletal: Normal  range of motion. He exhibits no tenderness.  Neurological: He is alert and oriented to person, place, and time.       Strength and sensation equal and intact bilaterally. NO midline spine tenderness or step off.    Skin: Skin is warm and dry. He is not diaphoretic.       Moist, white residue noted between toes on left foot.   Psychiatric: He has a normal mood and affect. His behavior is normal.    ED Course  Procedures (including critical care time)  Labs Reviewed  CBC WITH DIFFERENTIAL - Abnormal; Notable for the following:    Neutrophils Relative 36 (*)     Lymphocytes Relative 48 (*)     Monocytes Relative 13 (*)     All other components within normal limits  POCT I-STAT, CHEM 8   No results found.   No diagnosis found.    MDM  10:29 AM Labs drawn to check for anemia and electrolytes due to limb numbness. If labs are normal, he can be discharged with follow up with PCP. I will prescribe an antifungal cream for toes.   11:21 AM Lab work shows no acute abnormalities. No further workup needed at this time. Patient should follow up with his doctor for further evaluation of symptoms. I will prescribe terbinafine for his tinea pedis.       Emilia Beck, PA-C 01/17/12 1125

## 2012-01-18 NOTE — ED Provider Notes (Signed)
Medical screening examination/treatment/procedure(s) were performed by non-physician practitioner and as supervising physician I was immediately available for consultation/collaboration.  [name], MD 01/18/12 2010

## 2012-09-21 ENCOUNTER — Emergency Department (HOSPITAL_BASED_OUTPATIENT_CLINIC_OR_DEPARTMENT_OTHER)
Admission: EM | Admit: 2012-09-21 | Discharge: 2012-09-21 | Disposition: A | Discharge status: Home / Self Care | Payer: Medicaid Other | Attending: Emergency Medicine | Admitting: Emergency Medicine

## 2012-09-21 ENCOUNTER — Emergency Department (HOSPITAL_BASED_OUTPATIENT_CLINIC_OR_DEPARTMENT_OTHER): Payer: Medicaid Other

## 2012-09-21 ENCOUNTER — Encounter (HOSPITAL_BASED_OUTPATIENT_CLINIC_OR_DEPARTMENT_OTHER): Payer: Self-pay | Admitting: *Deleted

## 2012-09-21 DIAGNOSIS — F17200 Nicotine dependence, unspecified, uncomplicated: Secondary | ICD-10-CM | POA: Insufficient documentation

## 2012-09-21 DIAGNOSIS — J3489 Other specified disorders of nose and nasal sinuses: Secondary | ICD-10-CM | POA: Insufficient documentation

## 2012-09-21 DIAGNOSIS — G8929 Other chronic pain: Secondary | ICD-10-CM | POA: Insufficient documentation

## 2012-09-21 DIAGNOSIS — H5789 Other specified disorders of eye and adnexa: Secondary | ICD-10-CM | POA: Insufficient documentation

## 2012-09-21 DIAGNOSIS — M7989 Other specified soft tissue disorders: Secondary | ICD-10-CM | POA: Insufficient documentation

## 2012-09-21 DIAGNOSIS — Z79899 Other long term (current) drug therapy: Secondary | ICD-10-CM | POA: Insufficient documentation

## 2012-09-21 DIAGNOSIS — J441 Chronic obstructive pulmonary disease with (acute) exacerbation: Secondary | ICD-10-CM | POA: Insufficient documentation

## 2012-09-21 DIAGNOSIS — J449 Chronic obstructive pulmonary disease, unspecified: Secondary | ICD-10-CM

## 2012-09-21 DIAGNOSIS — Z8673 Personal history of transient ischemic attack (TIA), and cerebral infarction without residual deficits: Secondary | ICD-10-CM | POA: Insufficient documentation

## 2012-09-21 DIAGNOSIS — Z7982 Long term (current) use of aspirin: Secondary | ICD-10-CM | POA: Insufficient documentation

## 2012-09-21 DIAGNOSIS — Z9889 Other specified postprocedural states: Secondary | ICD-10-CM | POA: Insufficient documentation

## 2012-09-21 DIAGNOSIS — I10 Essential (primary) hypertension: Secondary | ICD-10-CM | POA: Insufficient documentation

## 2012-09-21 DIAGNOSIS — M25559 Pain in unspecified hip: Secondary | ICD-10-CM | POA: Insufficient documentation

## 2012-09-21 DIAGNOSIS — M79609 Pain in unspecified limb: Secondary | ICD-10-CM | POA: Insufficient documentation

## 2012-09-21 DIAGNOSIS — M549 Dorsalgia, unspecified: Secondary | ICD-10-CM | POA: Insufficient documentation

## 2012-09-21 MED ORDER — IPRATROPIUM BROMIDE 0.02 % IN SOLN
RESPIRATORY_TRACT | Status: AC
  Administered 2012-09-21: 0.5 mg via RESPIRATORY_TRACT
  Filled 2012-09-21: qty 2.5

## 2012-09-21 MED ORDER — PREDNISONE 50 MG PO TABS
60.0000 mg | ORAL_TABLET | Freq: Once | ORAL | Status: AC
  Administered 2012-09-21: 60 mg via ORAL
  Filled 2012-09-21: qty 1

## 2012-09-21 MED ORDER — IPRATROPIUM BROMIDE 0.02 % IN SOLN
0.5000 mg | Freq: Once | RESPIRATORY_TRACT | Status: AC
  Administered 2012-09-21: 0.5 mg via RESPIRATORY_TRACT

## 2012-09-21 MED ORDER — ALBUTEROL SULFATE HFA 108 (90 BASE) MCG/ACT IN AERS
2.0000 | INHALATION_SPRAY | RESPIRATORY_TRACT | Status: DC | PRN
  Administered 2012-09-21: 2 via RESPIRATORY_TRACT
  Filled 2012-09-21: qty 6.7

## 2012-09-21 MED ORDER — ALBUTEROL SULFATE (5 MG/ML) 0.5% IN NEBU
5.0000 mg | INHALATION_SOLUTION | Freq: Once | RESPIRATORY_TRACT | Status: AC
  Administered 2012-09-21: 5 mg via RESPIRATORY_TRACT

## 2012-09-21 MED ORDER — ALBUTEROL SULFATE (5 MG/ML) 0.5% IN NEBU
INHALATION_SOLUTION | RESPIRATORY_TRACT | Status: AC
  Administered 2012-09-21: 5 mg via RESPIRATORY_TRACT
  Filled 2012-09-21: qty 1

## 2012-09-21 MED ORDER — PREDNISONE 10 MG PO TABS: 20.0000 mg | ORAL_TABLET | Freq: Every day | ORAL | Status: DC

## 2012-09-21 NOTE — ED Notes (Signed)
Cough and sore throat. Penile discharge.

## 2012-09-21 NOTE — ED Provider Notes (Signed)
History     CSN: [id number]  Arrival date & time 09/21/12  1505   First MD Initiated Contact with Patient 09/21/12 1537      Chief Complaint  Patient presents with  . Cough    (Consider location/radiation/quality/duration/timing/severity/associated sxs/prior treatment) HPI  57 y.o. Male with cough for two months.  States smokes 1/2 ppd.  Complaining of chronic hip back leg pain.  Cough productive of white sputum.  Patient here with daughter also coughing.  Nasal congestion and some eye discharge.  States has to cough and blow nose every morning.  Some dyspnea with exertion, right foot swells more than left foot but not swollen badly now.    Past Medical History  Diagnosis Date  . Hypertension   . CVA (cerebral infarction)     Past Surgical History  Procedure Laterality Date  . Back surgery    . Hemorroidectomy      No family history on file.  History  Substance Use Topics  . Smoking status: Current Every Day Smoker -- 0.50 packs/day    Types: Cigarettes  . Smokeless tobacco: Not on file  . Alcohol Use: No      Review of Systems  All other systems reviewed and are negative.    Allergies  Review of patient's allergies indicates no known allergies.  Home Medications   Current Outpatient Rx  Name  Route  Sig  Dispense  Refill  . gabapentin (NEURONTIN) 100 MG capsule   Oral   Take 100 mg by mouth 3 (three) times daily.         Marland Kitchen losartan (COZAAR) 100 MG tablet   Oral   Take 100 mg by mouth daily.         Marland Kitchen EXPIRED: albuterol (PROVENTIL HFA;VENTOLIN HFA) 108 (90 BASE) MCG/ACT inhaler   Inhalation   Inhale 1-2 puffs into the lungs every 6 (six) hours as needed for wheezing.   1 Inhaler   0   . aspirin EC 81 MG tablet   Oral   Take 81 mg by mouth daily.           . hydrochlorothiazide (HYDRODIURIL) 25 MG tablet   Oral   Take 25 mg by mouth daily.           Marland Kitchen HYDROcodone-acetaminophen (VICODIN) 5-500 MG per tablet   Oral   Take 2 tablets  by mouth daily as needed. For pain         . ibuprofen (ADVIL,MOTRIN) 400 MG tablet   Oral   Take 400 mg by mouth every 6 (six) hours as needed. For pain         . Terbinafine 1 % GEL   Apply externally   Apply 0.25 g topically 2 (two) times daily.   12 g   0   . verapamil (VERELAN PM) 180 MG 24 hr capsule   Oral   Take 180 mg by mouth at bedtime.             BP 157/121  Pulse 104  Temp(Src) 98.8 F (37.1 C) (Oral)  Resp 18  Ht 6' 1.5" (1.867 m)  Wt 195 lb (88.451 kg)  BMI 25.38 kg/m2  SpO2 95%  Physical Exam  Nursing note and vitals reviewed. Constitutional: He is oriented to person, place, and time. He appears well-developed and well-nourished.  HENT:  Head: Normocephalic and atraumatic.  Eyes: Conjunctivae and EOM are normal. Pupils are equal, round, and reactive to light.  Neck: Normal range of  motion. Neck supple.  Cardiovascular: Normal rate and regular rhythm.   Pulmonary/Chest: Effort normal. He has wheezes.  Abdominal: Soft. Bowel sounds are normal.  Musculoskeletal: Normal range of motion.  Neurological: He is alert and oriented to person, place, and time.  Skin: Skin is warm and dry.  Psychiatric: He has a normal mood and affect.    ED Course  Procedures (including critical care time)  Labs Reviewed - No data to display No results found.   No diagnosis found.    MDM  Copd exacerbation- plan inhaler and prednisone        Hilario Quarry, MD 09/21/12 1718

## 2013-03-01 ENCOUNTER — Encounter (HOSPITAL_BASED_OUTPATIENT_CLINIC_OR_DEPARTMENT_OTHER): Payer: Self-pay

## 2013-03-01 ENCOUNTER — Emergency Department (HOSPITAL_BASED_OUTPATIENT_CLINIC_OR_DEPARTMENT_OTHER)
Admission: EM | Admit: 2013-03-01 | Discharge: 2013-03-01 | Disposition: A | Discharge status: Home / Self Care | Payer: Medicaid Other | Attending: Emergency Medicine | Admitting: Emergency Medicine

## 2013-03-01 ENCOUNTER — Emergency Department (HOSPITAL_BASED_OUTPATIENT_CLINIC_OR_DEPARTMENT_OTHER): Payer: Medicaid Other

## 2013-03-01 DIAGNOSIS — Z79899 Other long term (current) drug therapy: Secondary | ICD-10-CM | POA: Insufficient documentation

## 2013-03-01 DIAGNOSIS — I10 Essential (primary) hypertension: Secondary | ICD-10-CM | POA: Insufficient documentation

## 2013-03-01 DIAGNOSIS — Z8673 Personal history of transient ischemic attack (TIA), and cerebral infarction without residual deficits: Secondary | ICD-10-CM | POA: Insufficient documentation

## 2013-03-01 DIAGNOSIS — R0789 Other chest pain: Secondary | ICD-10-CM | POA: Insufficient documentation

## 2013-03-01 DIAGNOSIS — Z7982 Long term (current) use of aspirin: Secondary | ICD-10-CM | POA: Insufficient documentation

## 2013-03-01 DIAGNOSIS — F17200 Nicotine dependence, unspecified, uncomplicated: Secondary | ICD-10-CM | POA: Insufficient documentation

## 2013-03-01 DIAGNOSIS — J40 Bronchitis, not specified as acute or chronic: Secondary | ICD-10-CM | POA: Insufficient documentation

## 2013-03-01 HISTORY — DX: Other injury of unspecified body region, initial encounter: T14.8XXA

## 2013-03-01 MED ORDER — IPRATROPIUM BROMIDE 0.02 % IN SOLN
0.5000 mg | Freq: Once | RESPIRATORY_TRACT | Status: AC
  Administered 2013-03-01: 0.5 mg via RESPIRATORY_TRACT
  Filled 2013-03-01: qty 2.5

## 2013-03-01 MED ORDER — ALBUTEROL SULFATE (5 MG/ML) 0.5% IN NEBU
5.0000 mg | INHALATION_SOLUTION | Freq: Once | RESPIRATORY_TRACT | Status: AC
  Administered 2013-03-01: 5 mg via RESPIRATORY_TRACT
  Filled 2013-03-01: qty 1

## 2013-03-01 NOTE — ED Notes (Signed)
Pt reports a 3 week history of congestion and cough unrelieved after taking OTC medications.

## 2013-03-01 NOTE — ED Provider Notes (Signed)
CSN: [id number]     Arrival date & time 03/01/13  1019 History   First MD Initiated Contact with Patient 03/01/13 1044     Chief Complaint  Patient presents with  . Cough  . Nasal Congestion   (Consider location/radiation/quality/duration/timing/severity/associated sxs/prior Treatment) Patient is a 57 y.o. male presenting with cough. The history is provided by the patient.  Cough Cough characteristics:  Productive Sputum characteristics:  Clear Severity:  Mild Onset quality:  Gradual Duration:  3 weeks Timing:  Constant Progression:  Unchanged Chronicity:  New Smoker: yes   Context: not smoke exposure, not weather changes and not with activity   Relieved by:  Beta-agonist inhaler Worsened by:  Nothing tried Ineffective treatments:  None tried Associated symptoms: no fever and no shortness of breath     Past Medical History  Diagnosis Date  . Hypertension   . CVA (cerebral infarction)   . Nerve damage    Past Surgical History  Procedure Laterality Date  . Back surgery    . Hemorroidectomy    . Hip surgery     No family history on file. History  Substance Use Topics  . Smoking status: Current Every Day Smoker -- 1.00 packs/day    Types: Cigarettes  . Smokeless tobacco: Not on file  . Alcohol Use: No    Review of Systems  Constitutional: Negative for fever.  Respiratory: Positive for cough and chest tightness. Negative for shortness of breath.   All other systems reviewed and are negative.    Allergies  Review of patient's allergies indicates no known allergies.  Home Medications   Current Outpatient Rx  Name  Route  Sig  Dispense  Refill  . EXPIRED: albuterol (PROVENTIL HFA;VENTOLIN HFA) 108 (90 BASE) MCG/ACT inhaler   Inhalation   Inhale 1-2 puffs into the lungs every 6 (six) hours as needed for wheezing.   1 Inhaler   0   . aspirin EC 81 MG tablet   Oral   Take 81 mg by mouth daily.           Marland Kitchen gabapentin (NEURONTIN) 100 MG capsule   Oral  Take 100 mg by mouth 3 (three) times daily.         . hydrochlorothiazide (HYDRODIURIL) 25 MG tablet   Oral   Take 25 mg by mouth daily.           Marland Kitchen ibuprofen (ADVIL,MOTRIN) 400 MG tablet   Oral   Take 400 mg by mouth every 6 (six) hours as needed. For pain         . verapamil (VERELAN PM) 180 MG 24 hr capsule   Oral   Take 180 mg by mouth at bedtime.            BP 112/78  Pulse 62  Temp(Src) 97.7 F (36.5 C) (Oral)  Resp 20  Ht 6\' 1"  (1.854 m)  Wt 200 lb (90.719 kg)  BMI 26.39 kg/m2  SpO2 100% Physical Exam  Nursing note and vitals reviewed. Constitutional: He is oriented to person, place, and time. He appears well-developed and well-nourished. No distress.  HENT:  Head: Normocephalic and atraumatic.  Mouth/Throat: No oropharyngeal exudate.  Eyes: EOM are normal. Pupils are equal, round, and reactive to light.  Neck: Normal range of motion. Neck supple.  Cardiovascular: Normal rate and regular rhythm.  Exam reveals no friction rub.   No murmur heard. Pulmonary/Chest: Effort normal and breath sounds normal. No respiratory distress. He has no wheezes. He has no  rales.  Abdominal: He exhibits no distension. There is no tenderness. There is no rebound.  Musculoskeletal: Normal range of motion. He exhibits no edema.  Neurological: He is alert and oriented to person, place, and time.  Skin: He is not diaphoretic.    ED Course  Procedures (including critical care time) Labs Review Labs Reviewed - No data to display Imaging Review No results found.  MDM   1. Bronchitis      57 year old male presents with cough, runny nose. Productive cough with occasional phlegm. He is a smoker. Patient denies any fevers. Lungs clear here. He was wheezing at home and took albuterol. Breathing treatment given here. Chest x-ray is normal without pneumonia. Patient's clinical picture likely consistent with bronchitis. Instructed to continue supportive care.   Dagmar Hait,  MD 03/01/13 [phone]

## 2013-03-01 NOTE — ED Notes (Signed)
Patient transported to X-ray 

## 2013-04-11 ENCOUNTER — Emergency Department (HOSPITAL_BASED_OUTPATIENT_CLINIC_OR_DEPARTMENT_OTHER)
Admission: EM | Admit: 2013-04-11 | Discharge: 2013-04-11 | Disposition: A | Discharge status: Home / Self Care | Payer: Medicaid Other | Attending: Emergency Medicine | Admitting: Emergency Medicine

## 2013-04-11 ENCOUNTER — Emergency Department (HOSPITAL_BASED_OUTPATIENT_CLINIC_OR_DEPARTMENT_OTHER): Payer: Medicaid Other

## 2013-04-11 ENCOUNTER — Encounter (HOSPITAL_BASED_OUTPATIENT_CLINIC_OR_DEPARTMENT_OTHER): Payer: Self-pay | Admitting: Emergency Medicine

## 2013-04-11 DIAGNOSIS — Z79899 Other long term (current) drug therapy: Secondary | ICD-10-CM | POA: Insufficient documentation

## 2013-04-11 DIAGNOSIS — R42 Dizziness and giddiness: Secondary | ICD-10-CM | POA: Insufficient documentation

## 2013-04-11 DIAGNOSIS — Z8673 Personal history of transient ischemic attack (TIA), and cerebral infarction without residual deficits: Secondary | ICD-10-CM | POA: Insufficient documentation

## 2013-04-11 DIAGNOSIS — F17200 Nicotine dependence, unspecified, uncomplicated: Secondary | ICD-10-CM | POA: Insufficient documentation

## 2013-04-11 DIAGNOSIS — R0602 Shortness of breath: Secondary | ICD-10-CM | POA: Insufficient documentation

## 2013-04-11 DIAGNOSIS — R059 Cough, unspecified: Secondary | ICD-10-CM | POA: Insufficient documentation

## 2013-04-11 DIAGNOSIS — Z7982 Long term (current) use of aspirin: Secondary | ICD-10-CM | POA: Insufficient documentation

## 2013-04-11 DIAGNOSIS — R55 Syncope and collapse: Secondary | ICD-10-CM | POA: Insufficient documentation

## 2013-04-11 DIAGNOSIS — M549 Dorsalgia, unspecified: Secondary | ICD-10-CM | POA: Insufficient documentation

## 2013-04-11 DIAGNOSIS — J3489 Other specified disorders of nose and nasal sinuses: Secondary | ICD-10-CM | POA: Insufficient documentation

## 2013-04-11 DIAGNOSIS — R110 Nausea: Secondary | ICD-10-CM | POA: Insufficient documentation

## 2013-04-11 DIAGNOSIS — I10 Essential (primary) hypertension: Secondary | ICD-10-CM | POA: Insufficient documentation

## 2013-04-11 DIAGNOSIS — R05 Cough: Secondary | ICD-10-CM | POA: Insufficient documentation

## 2013-04-11 DIAGNOSIS — M7989 Other specified soft tissue disorders: Secondary | ICD-10-CM | POA: Insufficient documentation

## 2013-04-11 LAB — URINALYSIS, ROUTINE W REFLEX MICROSCOPIC
Bilirubin Urine: NEGATIVE
Glucose, UA: NEGATIVE mg/dL
Hgb urine dipstick: NEGATIVE
Ketones, ur: NEGATIVE mg/dL
Nitrite: NEGATIVE
Specific Gravity, Urine: 1.014 (ref 1.005–1.030)
Urobilinogen, UA: 1 mg/dL (ref 0.0–1.0)

## 2013-04-11 LAB — COMPREHENSIVE METABOLIC PANEL
AST: 18 U/L (ref 0–37)
Alkaline Phosphatase: 93 U/L (ref 39–117)
BUN: 16 mg/dL (ref 6–23)
CO2: 23 mEq/L (ref 19–32)
Calcium: 9.5 mg/dL (ref 8.4–10.5)
Chloride: 102 mEq/L (ref 96–112)
Creatinine, Ser: 1.2 mg/dL (ref 0.50–1.35)
GFR calc Af Amer: 75 mL/min — ABNORMAL LOW (ref >90)
GFR calc non Af Amer: 65 mL/min — ABNORMAL LOW (ref >90)
Glucose, Bld: 92 mg/dL (ref 70–99)
Potassium: 3.5 mEq/L (ref 3.5–5.1)
Total Bilirubin: 0.3 mg/dL (ref 0.3–1.2)

## 2013-04-11 LAB — CBC WITH DIFFERENTIAL/PLATELET
Basophils Relative: 0 % (ref 0–1)
Eosinophils Relative: 2 % (ref 0–5)
HCT: 42.4 % (ref 39.0–52.0)
Hemoglobin: 15.1 g/dL (ref 13.0–17.0)
Lymphocytes Relative: 40 % (ref 12–46)
Lymphs Abs: 2.5 10*3/uL (ref 0.7–4.0)
MCHC: 35.6 g/dL (ref 30.0–36.0)
MCV: 90.8 fL (ref 78.0–100.0)
Monocytes Absolute: 0.9 10*3/uL (ref 0.1–1.0)
Monocytes Relative: 14 % — ABNORMAL HIGH (ref 3–12)
Neutro Abs: 2.8 10*3/uL (ref 1.7–7.7)
RBC: 4.67 MIL/uL (ref 4.22–5.81)
RDW: 14.5 % (ref 11.5–15.5)
WBC: 6.3 10*3/uL (ref 4.0–10.5)

## 2013-04-11 LAB — D-DIMER, QUANTITATIVE: D-Dimer, Quant: 0.61 ug/mL-FEU — ABNORMAL HIGH (ref 0.00–0.48)

## 2013-04-11 LAB — URINE MICROSCOPIC-ADD ON

## 2013-04-11 LAB — TROPONIN I: Troponin I: <0.3 ng/mL (ref <0.30)

## 2013-04-11 MED ORDER — IOHEXOL 350 MG/ML SOLN
80.0000 mL | Freq: Once | INTRAVENOUS | Status: AC | PRN
  Administered 2013-04-11: 80 mL via INTRAVENOUS

## 2013-04-11 MED ORDER — SODIUM CHLORIDE 0.9 % IV SOLN
INTRAVENOUS | Status: DC
  Administered 2013-04-11: 19:00:00 via INTRAVENOUS

## 2013-04-11 MED ORDER — SODIUM CHLORIDE 0.9 % IV BOLUS (SEPSIS)
250.0000 mL | Freq: Once | INTRAVENOUS | Status: AC
  Administered 2013-04-11: 250 mL via INTRAVENOUS

## 2013-04-11 MED ORDER — PROMETHAZINE HCL 25 MG PO TABS: 25.0000 mg | ORAL_TABLET | Freq: Four times a day (QID) | ORAL | Status: DC | PRN

## 2013-04-11 MED ORDER — ONDANSETRON 4 MG PO TBDP: 4.0000 mg | ORAL_TABLET | Freq: Three times a day (TID) | ORAL | Status: DC | PRN

## 2013-04-11 MED ORDER — ONDANSETRON HCL 4 MG/2ML IJ SOLN
4.0000 mg | Freq: Once | INTRAMUSCULAR | Status: AC
  Administered 2013-04-11: 4 mg via INTRAVENOUS
  Filled 2013-04-11: qty 2

## 2013-04-11 NOTE — ED Notes (Signed)
MD at bedside. 

## 2013-04-11 NOTE — ED Provider Notes (Signed)
CSN: [id number]     Arrival date & time 04/11/13  1812 History  This chart was scribed for Craig Jakes, MD by Craig Holden, ED Scribe. This patient was seen in room MH06/MH06 and the patient's care was started at 6:50 PM.      Chief Complaint  Patient presents with  . Loss of Consciousness   (Consider location/radiation/quality/duration/timing/severity/associated sxs/prior Treatment) Patient is a 57 y.o. male presenting with syncope. The history is provided by the patient. No language interpreter was used.  Loss of Consciousness Episode history:  Single Most recent episode:  Today Duration:  2 hours Context: standing up   Witnessed: no   Relieved by:  None tried Worsened by:  Nothing tried Associated symptoms: nausea and shortness of breath   Associated symptoms: no chest pain, no confusion, no difficulty breathing, no dizziness, no fever, no headaches, no visual change, no vomiting and no weakness    HPI Comments: Craig Holden is a 58 y.o. male with a hx of strokes who presents to the Emergency Department complaining of sudden onset syncope thathappened this afternoon about 2x hours ago. Pt states that he felt fine yesterday and started feeling nauseated around 4am this morning that persisted throughout the day. He was at home today resting on his bed and as he moved to stand up he states that he blacked out. He states that his family heard him fall.  Pt states that his BP was very high prior to his episode.  He also complains of a productive cough which he was seen for on October 3rd and diagnosed with bronchitis.  Pt is a smoker. Pt has a Hx of stroke  Past Medical History  Diagnosis Date  . Hypertension   . CVA (cerebral infarction)   . Nerve damage    Past Surgical History  Procedure Laterality Date  . Back surgery    . Hemorroidectomy    . Hip surgery     No family history on file. History  Substance Use Topics  . Smoking status: Current Every Day Smoker -- 1.00  packs/day    Types: Cigarettes  . Smokeless tobacco: Not on file  . Alcohol Use: No    Review of Systems  Constitutional: Negative for fever and chills.  HENT: Positive for congestion.   Eyes: Negative for visual disturbance.  Respiratory: Positive for cough and shortness of breath.   Cardiovascular: Positive for leg swelling and syncope. Negative for chest pain.  Gastrointestinal: Positive for nausea. Negative for vomiting, abdominal pain and diarrhea.  Genitourinary: Negative for dysuria.  Musculoskeletal: Positive for back pain. Negative for neck pain.  Skin: Negative for rash.  Neurological: Positive for light-headedness. Negative for dizziness, weakness and headaches.  Hematological: Does not bruise/bleed easily.  Psychiatric/Behavioral: Negative for confusion.  All other systems reviewed and are negative.    Allergies  Review of patient's allergies indicates no known allergies.  Home Medications   Current Outpatient Rx  Name  Route  Sig  Dispense  Refill  . EXPIRED: albuterol (PROVENTIL HFA;VENTOLIN HFA) 108 (90 BASE) MCG/ACT inhaler   Inhalation   Inhale 1-2 puffs into the lungs every 6 (six) hours as needed for wheezing.   1 Inhaler   0   . aspirin EC 81 MG tablet   Oral   Take 81 mg by mouth daily.           Marland Kitchen gabapentin (NEURONTIN) 100 MG capsule   Oral   Take 100 mg by mouth 3 (  three) times daily.         . hydrochlorothiazide (HYDRODIURIL) 25 MG tablet   Oral   Take 25 mg by mouth daily.           Marland Kitchen ibuprofen (ADVIL,MOTRIN) 400 MG tablet   Oral   Take 400 mg by mouth every 6 (six) hours as needed. For pain         . ondansetron (ZOFRAN ODT) 4 MG disintegrating tablet   Oral   Take 1 tablet (4 mg total) by mouth every 8 (eight) hours as needed for nausea or vomiting.   20 tablet   0   . promethazine (PHENERGAN) 25 MG tablet   Oral   Take 1 tablet (25 mg total) by mouth every 6 (six) hours as needed for nausea or vomiting.   12 tablet    0   . verapamil (VERELAN PM) 180 MG 24 hr capsule   Oral   Take 180 mg by mouth at bedtime.            BP 144/67  Pulse 70  Temp(Src) 98.1 F (36.7 C) (Oral)  Resp 20  Ht 6\' 2"  (1.88 m)  Wt 195 lb (88.451 kg)  BMI 25.03 kg/m2  SpO2 98% Physical Exam  Nursing note and vitals reviewed. Constitutional: He is oriented to person, place, and time. He appears well-developed and well-nourished. No distress.  HENT:  Head: Normocephalic and atraumatic.  Eyes: EOM are normal.  Neck: Neck supple. No tracheal deviation present.  Cardiovascular: Normal rate, regular rhythm and normal heart sounds.   No swelling in the ankles  Pulmonary/Chest: Effort normal. No respiratory distress.  Abdominal: He exhibits no distension. There is no tenderness.  Musculoskeletal: Normal range of motion. He exhibits no edema.  Neurological: He is alert and oriented to person, place, and time. No cranial nerve deficit. He exhibits normal muscle tone. Coordination normal.  Skin: Skin is warm and dry.  Psychiatric: He has a normal mood and affect. His behavior is normal.    ED Course  Procedures (including critical care time) DIAGNOSTIC STUDIES: Oxygen Saturation is 98% on RA, normal by my interpretation.    COORDINATION OF CARE:    6:58 PM- Pt advised of plan for treatment CT of pt headand pt agrees.  Labs Review Labs Reviewed  CBC WITH DIFFERENTIAL - Abnormal; Notable for the following:    Monocytes Relative 14 (*)    All other components within normal limits  COMPREHENSIVE METABOLIC PANEL - Abnormal; Notable for the following:    GFR calc non Af Amer 65 (*)    GFR calc Af Amer 75 (*)    All other components within normal limits  D-DIMER, QUANTITATIVE - Abnormal; Notable for the following:    D-Dimer, Quant 0.61 (*)    All other components within normal limits  URINALYSIS, ROUTINE W REFLEX MICROSCOPIC - Abnormal; Notable for the following:    Leukocytes, UA TRACE (*)    All other components  within normal limits  LIPASE, BLOOD  TROPONIN I  URINE MICROSCOPIC-ADD ON   Results for orders placed during the hospital encounter of 04/11/13  CBC WITH DIFFERENTIAL      Result Value Range   WBC 6.3  4.0 - 10.5 K/uL   RBC 4.67  4.22 - 5.81 MIL/uL   Hemoglobin 15.1  13.0 - 17.0 g/dL   HCT 95.6  21.3 - 08.6 %   MCV 90.8  78.0 - 100.0 fL   MCH 32.3  26.0 -  34.0 pg   MCHC 35.6  30.0 - 36.0 g/dL   RDW 16.1  09.6 - 04.5 %   Platelets 174  150 - 400 K/uL   Neutrophils Relative % 44  43 - 77 %   Neutro Abs 2.8  1.7 - 7.7 K/uL   Lymphocytes Relative 40  12 - 46 %   Lymphs Abs 2.5  0.7 - 4.0 K/uL   Monocytes Relative 14 (*) 3 - 12 %   Monocytes Absolute 0.9  0.1 - 1.0 K/uL   Eosinophils Relative 2  0 - 5 %   Eosinophils Absolute 0.1  0.0 - 0.7 K/uL   Basophils Relative 0  0 - 1 %   Basophils Absolute 0.0  0.0 - 0.1 K/uL  COMPREHENSIVE METABOLIC PANEL      Result Value Range   Sodium 137  135 - 145 mEq/L   Potassium 3.5  3.5 - 5.1 mEq/L   Chloride 102  96 - 112 mEq/L   CO2 23  19 - 32 mEq/L   Glucose, Bld 92  70 - 99 mg/dL   BUN 16  6 - 23 mg/dL   Creatinine, Ser 4.09  0.50 - 1.35 mg/dL   Calcium 9.5  8.4 - 81.1 mg/dL   Total Protein 7.3  6.0 - 8.3 g/dL   Albumin 3.8  3.5 - 5.2 g/dL   AST 18  0 - 37 U/L   ALT 17  0 - 53 U/L   Alkaline Phosphatase 93  39 - 117 U/L   Total Bilirubin 0.3  0.3 - 1.2 mg/dL   GFR calc non Af Amer 65 (*) >90 mL/min   GFR calc Af Amer 75 (*) >90 mL/min  LIPASE, BLOOD      Result Value Range   Lipase 27  11 - 59 U/L  TROPONIN I      Result Value Range   Troponin I <0.30  <0.30 ng/mL  D-DIMER, QUANTITATIVE      Result Value Range   D-Dimer, Quant 0.61 (*) 0.00 - 0.48 ug/mL-FEU  URINALYSIS, ROUTINE W REFLEX MICROSCOPIC      Result Value Range   Color, Urine YELLOW  YELLOW   APPearance CLEAR  CLEAR   Specific Gravity, Urine 1.014  1.005 - 1.030   pH 6.0  5.0 - 8.0   Glucose, UA NEGATIVE  NEGATIVE mg/dL   Hgb urine dipstick NEGATIVE  NEGATIVE    Bilirubin Urine NEGATIVE  NEGATIVE   Ketones, ur NEGATIVE  NEGATIVE mg/dL   Protein, ur NEGATIVE  NEGATIVE mg/dL   Urobilinogen, UA 1.0  0.0 - 1.0 mg/dL   Nitrite NEGATIVE  NEGATIVE   Leukocytes, UA TRACE (*) NEGATIVE  URINE MICROSCOPIC-ADD ON      Result Value Range   Squamous Epithelial / LPF RARE  RARE   WBC, UA 0-2  <3 WBC/hpf   Bacteria, UA RARE  RARE    Imaging Review Dg Chest 2 View  04/11/2013   CLINICAL DATA:  Loss of consciousness.  EXAM: CHEST  2 VIEW  COMPARISON:  Chest radiograph performed 03/01/2013  FINDINGS: The lungs are well-aerated and clear. There is no evidence of focal opacification, pleural effusion or pneumothorax.  The heart is normal in size; the mediastinal contour is within normal limits. No acute osseous abnormalities are seen.  IMPRESSION: No acute cardiopulmonary process seen.   Electronically Signed   By: Roanna Raider M.D.   On: 04/11/2013 21:32   Ct Head Wo Contrast  04/11/2013  CLINICAL DATA:  Syncope; weakness.  Chest pain.  EXAM: CT HEAD WITHOUT CONTRAST  TECHNIQUE: Contiguous axial images were obtained from the base of the skull through the vertex without intravenous contrast.  COMPARISON:  CT of the head performed 08/05/2009, and MRI/MRA of the brain performed 08/07/2009  FINDINGS: There is no evidence of acute infarction, mass lesion, or intra- or extra-axial hemorrhage on CT.  A mildly prominent sulcus at the inferior right temporal lobe is stable in appearance. The posterior fossa, including the cerebellum, brainstem and fourth ventricle, is within normal limits. The third and lateral ventricles, and basal ganglia are unremarkable in appearance. The cerebral hemispheres are symmetric in appearance, with normal gray-white differentiation. No mass effect or midline shift is seen.  There is no evidence of fracture; visualized osseous structures are unremarkable in appearance. The visualized portions of the orbits are within normal limits. The paranasal  sinuses and mastoid air cells are well-aerated. No significant soft tissue abnormalities are seen.  IMPRESSION: Unremarkable noncontrast CT of the head.   Electronically Signed   By: Roanna Raider M.D.   On: 04/11/2013 21:31   Ct Angio Chest Pe W/cm &/or Wo Cm  04/11/2013   CLINICAL DATA:  Syncope; weakness. Chest pain. Loss of consciousness.  EXAM: CT ANGIOGRAPHY CHEST WITH CONTRAST  TECHNIQUE: Multidetector CT imaging of the chest was performed using the standard protocol during bolus administration of intravenous contrast. Multiplanar CT image reconstructions including MIPs were obtained to evaluate the vascular anatomy.  CONTRAST:  80mL OMNIPAQUE IOHEXOL 350 MG/ML SOLN  COMPARISON:  CTA of the chest performed 02/20/2007, and chest radiograph performed earlier today at 9:04 p.m.  FINDINGS: There is no evidence of pulmonary embolus.  There is a mildly mosaic pattern of parenchymal attenuation at the lung bases. A few scattered peripheral blebs are seen. The lungs are otherwise clear. There is no evidence of significant focal consolidation, pleural effusion or pneumothorax. No masses are identified; no abnormal focal contrast enhancement is seen.  The mediastinum is unremarkable in appearance. No mediastinal lymphadenopathy is seen. No pericardial effusion is identified. The great vessels are grossly unremarkable in appearance. No axillary lymphadenopathy is seen. The visualized portions of the thyroid gland are unremarkable in appearance.  Scattered small hypodensities within the liver are nonspecific but may reflect small cysts, measuring up to 1.5 cm in size. The visualized portions of the spleen are unremarkable.  No acute osseous abnormalities are seen.  Review of the MIP images confirms the above findings.  IMPRESSION: 1. No evidence of pulmonary embolus. 2. Mildly mosaic pattern of parenchymal attenuation at the lung bases ; a few scattered peripheral blebs seen. Lungs otherwise clear. 3. Possible small  hepatic cysts, nonspecific in appearance.   Electronically Signed   By: Roanna Raider M.D.   On: 04/11/2013 21:39    EKG Interpretation     Ventricular Rate:  92 PR Interval:  166 QRS Duration: 76 QT Interval:  350 QTC Calculation: 432 R Axis:   77 Text Interpretation:  Normal sinus rhythm Biatrial enlargement Septal infarct , age undetermined Abnormal ECG No significant change was found except for improved t waves.            MDM   1. Nausea   2. Syncope    Patient in feeling nauseated all day. Patient with a syncopal episode around 4 in the. Felt dizzy prior to the episode did not hurt anything with fall. No chest pain, patient's troponin was negative EKG had no acute  changes. Patient's d-dimer was elevated so CT chest was done no evidence of pulmonary embolism no evidence of pneumonia or pulmonary edema. Patient's cardiac monitor here without any arrhythmias. This event occurred in the afternoon patient has now been observed here up until 10:30 PM. Patient wants to go home will discharge his primary care Dr. followup with. Patient we sent home with antinausea medicine. Patient knows that he passes out again he needs to come back and will most likely need admission. For monitoring. Patient's head CT was also negative. Patient's had a history of stroke in the past patient has no neuro focal deficits following today's syncopal episode. Also no headache.  I personally performed the services described in this documentation, which was scribed in my presence. The recorded information has been reviewed and is accurate.     Craig Jakes, MD 04/11/13 2239

## 2013-04-11 NOTE — ED Notes (Signed)
Pt states he was dizzy as last recall and woke up on floor in his home approx 430pm-steady gait into triage-denies pain except "I hurt every day"-no new pain

## 2013-04-11 NOTE — ED Notes (Signed)
RT at bedside for bloodwork.  Patient's family member given Sprite & ice.

## 2013-12-26 ENCOUNTER — Encounter: Payer: Self-pay | Admitting: *Deleted

## 2014-01-01 ENCOUNTER — Ambulatory Visit (INDEPENDENT_AMBULATORY_CARE_PROVIDER_SITE_OTHER): Payer: Medicaid Other | Admitting: Cardiology

## 2014-01-01 ENCOUNTER — Encounter: Payer: Self-pay | Admitting: Cardiology

## 2014-01-01 VITALS — BP 124/74 | HR 78 | Ht 74.0 in | Wt 197.0 lb

## 2014-01-01 DIAGNOSIS — Z01818 Encounter for other preprocedural examination: Secondary | ICD-10-CM

## 2014-01-01 DIAGNOSIS — Z0289 Encounter for other administrative examinations: Secondary | ICD-10-CM

## 2014-01-01 DIAGNOSIS — Z8673 Personal history of transient ischemic attack (TIA), and cerebral infarction without residual deficits: Secondary | ICD-10-CM | POA: Insufficient documentation

## 2014-01-01 DIAGNOSIS — I10 Essential (primary) hypertension: Secondary | ICD-10-CM | POA: Insufficient documentation

## 2014-01-01 NOTE — Progress Notes (Signed)
Patient ID: Craig HildaJames B Dunbar, male   DOB: November 21, 1955, 58 y.o.   MRN: [id number]     Patient Name: Craig Holden Date of Encounter: 01/01/2014  Primary Care Provider:  Dorrene GermanAVBUERE,EDWIN A, MD Primary Cardiologist:  Lars MassonNELSON, [place] H   Problem List   Past Medical History  Diagnosis Date  . Hypertension   . CVA (cerebral infarction)   . Nerve damage    Past Surgical History  Procedure Laterality Date  . Back surgery    . Hemorroidectomy    . Hip surgery      Allergies  Allergies  Allergen Reactions  . Morphine Sulfate Nausea And Vomiting    HPI  Craig HildaJames B Huguley is a 58 y.o. Male, who is a veteran and has prior medical history of hypertension, stroke in 2011 and prior surgeries of his hip for which he is disabled. Patient is currently being evaluated for possible back surgery and is being sent to us for preoperative evaluation. The patient is limited with physical DVT because of his back pain and hip pain however walking from the car to severe and makes him short of breath and he denies any chest pain. He also denies palpitation or recent syncope. The patient denies any lower extremity edema, orthopnea or paroxysmal nocturnal dyspnea. He is an ongoing smoker.   Home Medications  Prior to Admission medications   Medication Sig Start Date End Date Taking? Authorizing Provider  albuterol (PROVENTIL HFA;VENTOLIN HFA) 108 (90 BASE) MCG/ACT inhaler Inhale 1-2 puffs into the lungs every 6 (six) hours as needed for wheezing. 06/12/11  Yes April K Palumbo-Rasch, MD  aspirin EC 81 MG tablet Take 81 mg by mouth daily.     Yes Historical Provider, MD  gabapentin (NEURONTIN) 100 MG capsule Take 100 mg by mouth 3 (three) times daily.   Yes Historical Provider, MD  hydrochlorothiazide (HYDRODIURIL) 25 MG tablet Take 12.5 mg by mouth daily.    Yes Historical Provider, MD  ibuprofen (ADVIL,MOTRIN) 400 MG tablet Take 400 mg by mouth every 6 (six) hours as needed. For pain   Yes Historical  Provider, MD    Family History  Family History  Problem Relation Age of Onset  . Hypertension Mother   . Diabetes Mother   . Heart attack Father   . Emphysema Father     Social History  History   Social History  . Marital Status: Single    Spouse Name: N/A    Number of Children: N/A  . Years of Education: N/A   Occupational History  . Not on file.   Social History Main Topics  . Smoking status: Current Every Day Smoker -- 1.00 packs/day    Types: Cigarettes  . Smokeless tobacco: Not on file  . Alcohol Use: No  . Drug Use: Yes    Special: Marijuana  . Sexual Activity: Not on file   Other Topics Concern  . Not on file   Social History Narrative  . No narrative on file     Review of Systems, as per HPI, otherwise negative General:  No chills, fever, night sweats or weight changes.  Cardiovascular:  No chest pain, dyspnea on exertion, edema, orthopnea, palpitations, paroxysmal nocturnal dyspnea. Dermatological: No rash, lesions/masses Respiratory: No cough, dyspnea Urologic: No hematuria, dysuria Abdominal:   No nausea, vomiting, diarrhea, bright red blood per rectum, melena, or hematemesis Neurologic:  No visual changes, wkns, changes in mental status. All other systems reviewed and are otherwise negative except as noted above.  Physical Exam  Blood pressure 124/74, pulse 78, height 6\' 2"  (1.88 m), weight 197 lb (89.359 kg).  General: Pleasant, NAD Psych: Normal affect. Neuro: Alert and oriented X 3. Moves all extremities spontaneously. HEENT: Normal  Neck: Supple without bruits or JVD. Lungs:  Resp regular and unlabored, CTA. Heart: RRR no s3, s4, or murmurs. Abdomen: Soft, non-tender, non-distended, BS + x 4.  Extremities: No clubbing, cyanosis or edema. DP/PT/Radials 2+ and equal bilaterally.  Labs:  No results found for this basename: CKTOTAL, CKMB, TROPONINI,  in the last 72 hours Lab Results  Component Value Date   WBC 6.3 04/11/2013   HGB  15.1 04/11/2013   HCT 42.4 04/11/2013   MCV 90.8 04/11/2013   PLT 174 04/11/2013    Lab Results  Component Value Date   DDIMER 0.61* 04/11/2013   No components found with this basename: POCBNP,     Component Value Date/Time   NA 137 04/11/2013 1910   K 3.5 04/11/2013 1910   CL 102 04/11/2013 1910   CO2 23 04/11/2013 1910   GLUCOSE 92 04/11/2013 1910   BUN 16 04/11/2013 1910   CREATININE 1.20 04/11/2013 1910   CALCIUM 9.5 04/11/2013 1910   PROT 7.3 04/11/2013 1910   ALBUMIN 3.8 04/11/2013 1910   AST 18 04/11/2013 1910   ALT 17 04/11/2013 1910   ALKPHOS 93 04/11/2013 1910   BILITOT 0.3 04/11/2013 1910   GFRNONAA 65* 04/11/2013 1910   GFRAA 75* 04/11/2013 1910   Lab Results  Component Value Date   CHOL  Value: 163                08/06/2009   HDL 24* 08/06/2009   LDLCALC  Value: 114        08/06/2009   TRIG 124 08/06/2009   Accessory Clinical Findings  Echocardiogram - 2011 Left ventricle: The cavity size was normal. There was moderate concentric hypertrophy. Systolic function was vigorous. The estimated ejection fraction was in the range of 65% to 70%. Wall motion was normal; there were no regional wall motion abnormalities. There was an increased relative contribution of atrial contraction to ventricular filling. Doppler parameters are consistent with abnormal left ventricular relaxation (grade 1 diastolic dysfunction).  ECG - sinus rhythm, 78 beats per minute, normal EKG.   Assessment & Plan  58 year old male with prior medical history of hypertension and stroke. The patient has no prior history of heart disease. He had an echocardiogram performed in 2011 that showed preserved left and right ventricular systolic function and no significant valvular abnormality. The patient denies any symptoms of angina or any symptoms of heart failure. He can certainly achieve at least 4 METs. He is considered to be a low risk for intermediate risk orthopedic surgery and there is  currently no contraindication from cardiology standpoint for this patient to undergo his surgery. Please contact us with any questions.  His blood pressure is well controlled and lipids are followed by his primary care physician.   Lars Masson, MD, [facility] 01/01/2014, 2:54 PM

## 2014-01-01 NOTE — Patient Instructions (Signed)
Your physician recommends that you schedule a follow-up appointment in: AS NEEDED WITH DR Delton SeeNELSON  DR Delton SeeNELSON RECOMMENDS YOU SCHEDULE AN APPOINTMENT WITH DR Lequita HaltALUISIO AT [facility]

## 2015-01-20 HISTORY — PX: ANTERIOR CERVICAL DECOMPRESSION/DISCECTOMY FUSION 4 LEVELS: SHX5556

## 2015-04-18 ENCOUNTER — Inpatient Hospital Stay (HOSPITAL_COMMUNITY)
Admission: EM | Admit: 2015-04-18 | Discharge: 2015-04-21 | DRG: 023 | Disposition: A | Discharge status: Home / Self Care | Payer: Medicaid Other | Attending: Neurology | Admitting: Neurology

## 2015-04-18 ENCOUNTER — Emergency Department (HOSPITAL_COMMUNITY): Payer: Medicaid Other | Admitting: Anesthesiology

## 2015-04-18 ENCOUNTER — Emergency Department (HOSPITAL_COMMUNITY): Payer: Medicaid Other

## 2015-04-18 ENCOUNTER — Encounter (HOSPITAL_COMMUNITY): Payer: Self-pay | Admitting: Radiology

## 2015-04-18 ENCOUNTER — Encounter (HOSPITAL_COMMUNITY)
Admission: EM | Disposition: A | Discharge status: Home / Self Care | Payer: Self-pay | Source: Home / Self Care | Attending: Neurology

## 2015-04-18 DIAGNOSIS — Z79899 Other long term (current) drug therapy: Secondary | ICD-10-CM | POA: Diagnosis not present

## 2015-04-18 DIAGNOSIS — I639 Cerebral infarction, unspecified: Secondary | ICD-10-CM | POA: Diagnosis not present

## 2015-04-18 DIAGNOSIS — F1210 Cannabis abuse, uncomplicated: Secondary | ICD-10-CM | POA: Insufficient documentation

## 2015-04-18 DIAGNOSIS — F17200 Nicotine dependence, unspecified, uncomplicated: Secondary | ICD-10-CM | POA: Diagnosis not present

## 2015-04-18 DIAGNOSIS — F17210 Nicotine dependence, cigarettes, uncomplicated: Secondary | ICD-10-CM | POA: Diagnosis present

## 2015-04-18 DIAGNOSIS — I10 Essential (primary) hypertension: Secondary | ICD-10-CM | POA: Diagnosis present

## 2015-04-18 DIAGNOSIS — J9600 Acute respiratory failure, unspecified whether with hypoxia or hypercapnia: Secondary | ICD-10-CM

## 2015-04-18 DIAGNOSIS — F4310 Post-traumatic stress disorder, unspecified: Secondary | ICD-10-CM | POA: Diagnosis present

## 2015-04-18 DIAGNOSIS — R4701 Aphasia: Secondary | ICD-10-CM | POA: Diagnosis present

## 2015-04-18 DIAGNOSIS — R29818 Other symptoms and signs involving the nervous system: Secondary | ICD-10-CM

## 2015-04-18 DIAGNOSIS — I63412 Cerebral infarction due to embolism of left middle cerebral artery: Principal | ICD-10-CM | POA: Diagnosis present

## 2015-04-18 DIAGNOSIS — I6522 Occlusion and stenosis of left carotid artery: Secondary | ICD-10-CM | POA: Diagnosis present

## 2015-04-18 DIAGNOSIS — F329 Major depressive disorder, single episode, unspecified: Secondary | ICD-10-CM | POA: Diagnosis present

## 2015-04-18 DIAGNOSIS — Q282 Arteriovenous malformation of cerebral vessels: Secondary | ICD-10-CM

## 2015-04-18 DIAGNOSIS — I63132 Cerebral infarction due to embolism of left carotid artery: Secondary | ICD-10-CM | POA: Diagnosis not present

## 2015-04-18 DIAGNOSIS — D62 Acute posthemorrhagic anemia: Secondary | ICD-10-CM | POA: Diagnosis not present

## 2015-04-18 DIAGNOSIS — I63232 Cerebral infarction due to unspecified occlusion or stenosis of left carotid arteries: Secondary | ICD-10-CM | POA: Diagnosis not present

## 2015-04-18 DIAGNOSIS — Z8249 Family history of ischemic heart disease and other diseases of the circulatory system: Secondary | ICD-10-CM

## 2015-04-18 DIAGNOSIS — J9601 Acute respiratory failure with hypoxia: Secondary | ICD-10-CM | POA: Diagnosis not present

## 2015-04-18 DIAGNOSIS — Z8673 Personal history of transient ischemic attack (TIA), and cerebral infarction without residual deficits: Secondary | ICD-10-CM | POA: Diagnosis not present

## 2015-04-18 DIAGNOSIS — J9690 Respiratory failure, unspecified, unspecified whether with hypoxia or hypercapnia: Secondary | ICD-10-CM

## 2015-04-18 DIAGNOSIS — Z7982 Long term (current) use of aspirin: Secondary | ICD-10-CM | POA: Diagnosis not present

## 2015-04-18 DIAGNOSIS — Z885 Allergy status to narcotic agent status: Secondary | ICD-10-CM

## 2015-04-18 DIAGNOSIS — I5032 Chronic diastolic (congestive) heart failure: Secondary | ICD-10-CM | POA: Diagnosis present

## 2015-04-18 DIAGNOSIS — E785 Hyperlipidemia, unspecified: Secondary | ICD-10-CM | POA: Diagnosis present

## 2015-04-18 DIAGNOSIS — I6789 Other cerebrovascular disease: Secondary | ICD-10-CM | POA: Diagnosis not present

## 2015-04-18 DIAGNOSIS — R29810 Facial weakness: Secondary | ICD-10-CM | POA: Diagnosis present

## 2015-04-18 DIAGNOSIS — Z981 Arthrodesis status: Secondary | ICD-10-CM | POA: Diagnosis not present

## 2015-04-18 DIAGNOSIS — R4781 Slurred speech: Secondary | ICD-10-CM | POA: Diagnosis present

## 2015-04-18 HISTORY — DX: Major depressive disorder, single episode, unspecified: F32.9

## 2015-04-18 HISTORY — DX: Post-traumatic stress disorder, unspecified: F43.10

## 2015-04-18 HISTORY — PX: RADIOLOGY WITH ANESTHESIA: SHX6223

## 2015-04-18 HISTORY — DX: Depression, unspecified: F32.A

## 2015-04-18 LAB — RAPID URINE DRUG SCREEN, HOSP PERFORMED
Amphetamines: NOT DETECTED
BARBITURATES: NOT DETECTED
Benzodiazepines: NOT DETECTED
COCAINE: NOT DETECTED
Opiates: NOT DETECTED
TETRAHYDROCANNABINOL: POSITIVE — AB

## 2015-04-18 LAB — DIFFERENTIAL
BASOS ABS: 0 10*3/uL (ref 0.0–0.1)
Basophils Relative: 0 %
Eosinophils Absolute: 0.3 10*3/uL (ref 0.0–0.7)
Eosinophils Relative: 4 %
LYMPHS ABS: 3.5 10*3/uL (ref 0.7–4.0)
LYMPHS PCT: 53 %
Monocytes Absolute: 0.7 10*3/uL (ref 0.1–1.0)
Monocytes Relative: 11 %
NEUTROS PCT: 32 %
Neutro Abs: 2.2 10*3/uL (ref 1.7–7.7)

## 2015-04-18 LAB — I-STAT TROPONIN, ED: Troponin i, poc: 0.03 ng/mL (ref 0.00–0.08)

## 2015-04-18 LAB — CBC
HCT: 42 % (ref 39.0–52.0)
HEMOGLOBIN: 14.9 g/dL (ref 13.0–17.0)
MCH: 32.7 pg (ref 26.0–34.0)
MCHC: 35.5 g/dL (ref 30.0–36.0)
MCV: 92.1 fL (ref 78.0–100.0)
Platelets: 195 10*3/uL (ref 150–400)
RBC: 4.56 MIL/uL (ref 4.22–5.81)
RDW: 14.8 % (ref 11.5–15.5)
WBC: 6.7 10*3/uL (ref 4.0–10.5)

## 2015-04-18 LAB — I-STAT CHEM 8, ED
BUN: 7 mg/dL (ref 6–20)
Calcium, Ion: 1.15 mmol/L (ref 1.13–1.30)
Chloride: 107 mmol/L (ref 101–111)
Creatinine, Ser: 1 mg/dL (ref 0.61–1.24)
Glucose, Bld: 90 mg/dL (ref 65–99)
HCT: 45 % (ref 39.0–52.0)
HEMOGLOBIN: 15.3 g/dL (ref 13.0–17.0)
Potassium: 3.9 mmol/L (ref 3.5–5.1)
SODIUM: 140 mmol/L (ref 135–145)
TCO2: 22 mmol/L (ref 0–100)

## 2015-04-18 LAB — URINALYSIS, ROUTINE W REFLEX MICROSCOPIC
BILIRUBIN URINE: NEGATIVE
GLUCOSE, UA: NEGATIVE mg/dL
Hgb urine dipstick: NEGATIVE
KETONES UR: NEGATIVE mg/dL
LEUKOCYTES UA: NEGATIVE
Nitrite: NEGATIVE
PROTEIN: NEGATIVE mg/dL
Specific Gravity, Urine: >1.046 — ABNORMAL HIGH (ref 1.005–1.030)
pH: 5.5 (ref 5.0–8.0)

## 2015-04-18 LAB — COMPREHENSIVE METABOLIC PANEL
ALBUMIN: 3.5 g/dL (ref 3.5–5.0)
ALK PHOS: 103 U/L (ref 38–126)
ALT: 11 U/L — ABNORMAL LOW (ref 17–63)
AST: 18 U/L (ref 15–41)
Anion gap: 8 (ref 5–15)
BILIRUBIN TOTAL: 0.3 mg/dL (ref 0.3–1.2)
BUN: 8 mg/dL (ref 6–20)
CALCIUM: 9.4 mg/dL (ref 8.9–10.3)
CO2: 23 mmol/L (ref 22–32)
Chloride: 109 mmol/L (ref 101–111)
Creatinine, Ser: 0.99 mg/dL (ref 0.61–1.24)
GFR calc Af Amer: >60 mL/min (ref >60)
GFR calc non Af Amer: >60 mL/min (ref >60)
GLUCOSE: 95 mg/dL (ref 65–99)
Potassium: 4 mmol/L (ref 3.5–5.1)
Sodium: 140 mmol/L (ref 135–145)
TOTAL PROTEIN: 6.8 g/dL (ref 6.5–8.1)

## 2015-04-18 LAB — PROTIME-INR
INR: 0.94 (ref 0.00–1.49)
PROTHROMBIN TIME: 12.8 s (ref 11.6–15.2)

## 2015-04-18 LAB — APTT: APTT: 27 s (ref 24–37)

## 2015-04-18 LAB — ETHANOL: Alcohol, Ethyl (B): <5 mg/dL (ref <5)

## 2015-04-18 SURGERY — RADIOLOGY WITH ANESTHESIA
Anesthesia: General

## 2015-04-18 MED ORDER — FENTANYL CITRATE (PF) 250 MCG/5ML IJ SOLN
INTRAMUSCULAR | Status: AC
  Filled 2015-04-18: qty 5

## 2015-04-18 MED ORDER — DEXTROSE 5 % IV SOLN
10.0000 mg | INTRAVENOUS | Status: DC | PRN
  Administered 2015-04-18: 20 ug/min via INTRAVENOUS

## 2015-04-18 MED ORDER — NITROGLYCERIN 1 MG/10 ML FOR IR/CATH LAB
INTRA_ARTERIAL | Status: AC
  Administered 2015-04-18: 25 ug via INTRA_ARTERIAL
  Filled 2015-04-18: qty 10

## 2015-04-18 MED ORDER — LABETALOL HCL 5 MG/ML IV SOLN: 10.0000 mg | INTRAVENOUS | Status: DC | PRN

## 2015-04-18 MED ORDER — GLYCOPYRROLATE 0.2 MG/ML IJ SOLN
INTRAMUSCULAR | Status: AC
  Filled 2015-04-18: qty 2

## 2015-04-18 MED ORDER — ACETAMINOPHEN 325 MG PO TABS: 650.0000 mg | ORAL_TABLET | ORAL | Status: DC | PRN

## 2015-04-18 MED ORDER — PHENYLEPHRINE HCL 10 MG/ML IJ SOLN: 10.0000 mg | INTRAVENOUS | Status: DC | PRN

## 2015-04-18 MED ORDER — ALTEPLASE 30 MG/30 ML FOR INTERV. RAD
1.0000 mg | INTRA_ARTERIAL | Status: AC
Start: 2015-04-18 — End: 2015-04-19
  Filled 2015-04-18: qty 30

## 2015-04-18 MED ORDER — ONDANSETRON HCL 4 MG/2ML IJ SOLN
INTRAMUSCULAR | Status: AC
  Filled 2015-04-18: qty 2

## 2015-04-18 MED ORDER — LIDOCAINE HCL (CARDIAC) 20 MG/ML IV SOLN
INTRAVENOUS | Status: DC | PRN
  Administered 2015-04-18: 100 mg via INTRAVENOUS

## 2015-04-18 MED ORDER — CLOPIDOGREL BISULFATE 300 MG PO TABS
300.0000 mg | ORAL_TABLET | Freq: Once | ORAL | Status: AC
  Administered 2015-04-19: 375 mg via ORAL

## 2015-04-18 MED ORDER — FENTANYL CITRATE (PF) 250 MCG/5ML IJ SOLN
INTRAMUSCULAR | Status: DC | PRN
  Administered 2015-04-18 – 2015-04-19 (×5): 50 ug via INTRAVENOUS

## 2015-04-18 MED ORDER — LACTATED RINGERS IV SOLN
INTRAVENOUS | Status: DC | PRN
  Administered 2015-04-18 – 2015-04-19 (×2): via INTRAVENOUS

## 2015-04-18 MED ORDER — ASPIRIN 325 MG PO TABS
625.0000 mg | ORAL_TABLET | Freq: Once | ORAL | Status: AC
  Administered 2015-04-19: 625 mg via ORAL

## 2015-04-18 MED ORDER — SENNOSIDES-DOCUSATE SODIUM 8.6-50 MG PO TABS
1.0000 | ORAL_TABLET | Freq: Every evening | ORAL | Status: DC | PRN
  Filled 2015-04-18: qty 1

## 2015-04-18 MED ORDER — ROCURONIUM BROMIDE 100 MG/10ML IV SOLN
INTRAVENOUS | Status: DC | PRN
  Administered 2015-04-18: 50 mg via INTRAVENOUS
  Administered 2015-04-19 (×5): 25 mg via INTRAVENOUS

## 2015-04-18 MED ORDER — SUCCINYLCHOLINE CHLORIDE 20 MG/ML IJ SOLN
INTRAMUSCULAR | Status: AC
  Filled 2015-04-18: qty 1

## 2015-04-18 MED ORDER — NEOSTIGMINE METHYLSULFATE 10 MG/10ML IV SOLN
INTRAVENOUS | Status: AC
  Filled 2015-04-18: qty 1

## 2015-04-18 MED ORDER — STROKE: EARLY STAGES OF RECOVERY BOOK
Freq: Once | Status: DC
  Filled 2015-04-18: qty 1

## 2015-04-18 MED ORDER — CEFAZOLIN SODIUM-DEXTROSE 2-3 GM-% IV SOLR
INTRAVENOUS | Status: DC | PRN
  Administered 2015-04-18: 2 g via INTRAVENOUS

## 2015-04-18 MED ORDER — PROPOFOL 10 MG/ML IV BOLUS
INTRAVENOUS | Status: DC | PRN
  Administered 2015-04-18 – 2015-04-19 (×2): 200 mg via INTRAVENOUS

## 2015-04-18 MED ORDER — CEFAZOLIN SODIUM-DEXTROSE 2-3 GM-% IV SOLR
INTRAVENOUS | Status: AC
  Filled 2015-04-18: qty 50

## 2015-04-18 MED ORDER — PROPOFOL 10 MG/ML IV BOLUS
INTRAVENOUS | Status: AC
  Filled 2015-04-18: qty 20

## 2015-04-18 MED ORDER — SODIUM CHLORIDE 0.9 % IV SOLN
INTRAVENOUS | Status: DC
  Administered 2015-04-19: 04:00:00 via INTRAVENOUS

## 2015-04-18 MED ORDER — LIDOCAINE HCL (CARDIAC) 20 MG/ML IV SOLN
INTRAVENOUS | Status: AC
  Filled 2015-04-18: qty 5

## 2015-04-18 MED ORDER — SUCCINYLCHOLINE CHLORIDE 20 MG/ML IJ SOLN
INTRAMUSCULAR | Status: DC | PRN
  Administered 2015-04-18: 100 mg via INTRAVENOUS

## 2015-04-18 MED ORDER — ACETAMINOPHEN 650 MG RE SUPP: 650.0000 mg | RECTAL | Status: DC | PRN

## 2015-04-18 MED ORDER — ALTEPLASE (STROKE) FULL DOSE INFUSION
0.9000 mg/kg | Freq: Once | INTRAVENOUS | Status: AC
  Administered 2015-04-18: 79 mg via INTRAVENOUS
  Filled 2015-04-18: qty 79

## 2015-04-18 MED ORDER — PANTOPRAZOLE SODIUM 40 MG IV SOLR
40.0000 mg | Freq: Every day | INTRAVENOUS | Status: DC
  Administered 2015-04-19: 40 mg via INTRAVENOUS
  Filled 2015-04-18: qty 40

## 2015-04-18 MED ORDER — MIDAZOLAM HCL 2 MG/2ML IJ SOLN
INTRAMUSCULAR | Status: AC
  Filled 2015-04-18: qty 2

## 2015-04-18 MED ORDER — GLYCOPYRROLATE 0.2 MG/ML IJ SOLN
INTRAMUSCULAR | Status: DC | PRN
  Administered 2015-04-18: 0.2 mg via INTRAVENOUS

## 2015-04-18 MED ORDER — ROCURONIUM BROMIDE 50 MG/5ML IV SOLN
INTRAVENOUS | Status: AC
  Filled 2015-04-18: qty 1

## 2015-04-18 MED ORDER — CLOPIDOGREL BISULFATE 75 MG PO TABS
ORAL_TABLET | ORAL | Status: AC
  Filled 2015-04-18: qty 5

## 2015-04-18 MED ORDER — ASPIRIN 325 MG PO TABS
ORAL_TABLET | ORAL | Status: AC
  Filled 2015-04-18: qty 2

## 2015-04-18 MED ORDER — IOHEXOL 350 MG/ML SOLN
90.0000 mL | Freq: Once | INTRAVENOUS | Status: AC | PRN
  Administered 2015-04-18: 100 mL via INTRAVENOUS

## 2015-04-18 NOTE — Progress Notes (Addendum)
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 At bedside, patient appears comfortable. Exam notable for mild dysarthria, expressive aphasia and mild right sided weakness of face, arm and leg. NIHSS of 5. Will page Neuro-IR, Dr Corliss Skainseveshwar, to discuss further. Awaiting call back.   Addendum: Discussed case with Dr Corliss Skainseveshwar, based on presentation and CT A/P findings will proceed with diagnostic angiogram and potential intervention. Discussed tPA as patient still within the IV tPA window. Discussed risk/benefits with the patient including hemorrhage and/or death. He expressed understanding and wished to proceed with IV tPA. Attempted to contact family but available numbers not working. Reviewed non-contrast head CT and lab work with no clear contraindication so tPA initiated. After further review of CT angiogram and prior imaging noted R temporal AVM. Due to risk of bleed made decision to stop IV tPA (received bolus) and proceed with neuro IR intervention. Will continue to follow.

## 2015-04-18 NOTE — H&P (Signed)
Requesting Physician: Dr. Ethelda Chickjacubowitz  Chief Complaint: Stroke code, slurred speech  HPI:                                                                                                                                         Craig Holden is an 59 y.o. male who was noted by his daughter and wife at about 5:45 PM having slurred speech and questionable facial droop and possible right-sided weakness. He was transferred to the ER through EMS for her to evaluate for acute stroke. Per history from patient and his wife, he had a stroke in 2012 affecting his right side although denies any residual deficits. He recently had cervical spine fusion surgery in September or October 2016 for his wife. They both are poor historians. The patient was on oxycodone and apparently he ran out of them, on November 1st. He takes gabapentin 800 mg 3 times a day.  Date last known well: Date: 04/18/2015 Time last known well: Time: 17:45 tPA Given: No: No focal findings on examination to suggest an acute stroke clinically.    Past Medical History  Diagnosis Date  . Hypertension   . CVA (cerebral infarction)   . Nerve damage     Past Surgical History  Procedure Laterality Date  . Back surgery    . Hemorroidectomy    . Hip surgery      Family History  Problem Relation Age of Onset  . Hypertension Mother   . Diabetes Mother   . Heart attack Father   . Emphysema Father    Social History:  reports that he has been smoking Cigarettes.  He has been smoking about 1.00 pack per day. He does not have any smokeless tobacco history on file. He reports that he uses illicit drugs (Marijuana). He reports that he does not drink alcohol.  Allergies:  Allergies  Allergen Reactions  . Morphine Sulfate Nausea And Vomiting    Medications:                                                                                                                           No current facility-administered medications for this  encounter.   Current Outpatient Prescriptions  Medication Sig Dispense Refill  . albuterol (PROVENTIL HFA;VENTOLIN HFA) 108 (90 BASE) MCG/ACT inhaler Inhale 1-2 puffs into the lungs every 6 (six) hours as needed  for wheezing. 1 Inhaler 0  . aspirin EC 81 MG tablet Take 81 mg by mouth daily.      Marland Kitchen gabapentin (NEURONTIN) 100 MG capsule Take 100 mg by mouth 3 (three) times daily.    . hydrochlorothiazide (HYDRODIURIL) 25 MG tablet Take 12.5 mg by mouth daily.     Marland Kitchen ibuprofen (ADVIL,MOTRIN) 400 MG tablet Take 400 mg by mouth every 6 (six) hours as needed. For pain      ROS:                                                                                                                                       History obtained from the patient  General ROS: negative for - chills, fatigue, fever, night sweats, weight gain or weight loss Psychological ROS: negative for - behavioral disorder, hallucinations, memory difficulties, mood swings or suicidal ideation Ophthalmic ROS: negative for - blurry vision, double vision, eye pain or loss of vision ENT ROS: negative for - epistaxis, nasal discharge, oral lesions, sore throat, tinnitus or vertigo Allergy and Immunology ROS: negative for - hives or itchy/watery eyes Hematological and Lymphatic ROS: negative for - bleeding problems, bruising or swollen lymph nodes Endocrine ROS: negative for - galactorrhea, hair pattern changes, polydipsia/polyuria or temperature intolerance Respiratory ROS: negative for - cough, hemoptysis, shortness of breath or wheezing Cardiovascular ROS: negative for - chest pain, dyspnea on exertion, edema or irregular heartbeat Gastrointestinal ROS: negative for - abdominal pain, diarrhea, hematemesis, nausea/vomiting or stool incontinence Genito-Urinary ROS: negative for - dysuria, hematuria, incontinence or urinary frequency/urgency Musculoskeletal ROS: negative for - joint swelling or muscular weakness Neurological ROS: as noted  in HPI Dermatological ROS: negative for rash and skin lesion changes  Neurologic Examination:                                                                                                      Blood pressure 160/102, pulse 65, temperature 98.2 F (36.8 C), temperature source Oral, resp. rate 23, weight 87.3 kg (192 lb 7.4 oz), SpO2 99 %.  HEENT-  Normocephalic, no lesions, without obvious abnormality.  Normal external eye and conjunctiva.   Normal  external ears. Normal external nose   Neurological Examination Mental Status: Drowsy, oriented x 3, thought content appropriate.  Speech fluent  with no definite evidence of any palatal, labial or lingual dysarthria , no evidence of aphasia.  Able to follow 3 step commands without difficulty. Cranial Nerves: II:   Visual fields grossly  normal, pupils equal, round, reactive to light and accommodation III,IV, VI: ptosis not present, extra-ocular motions intact bilaterally V,VII: smile symmetric, facial light touch sensation normal bilaterally VIII: hearing normal bilaterally IX,X: uvula rises symmetrically XI: bilateral shoulder shrug XII: midline tongue extension Motor: Right : Upper extremity   5/5    Left:     Upper extremity   5/5  Lower extremity   5/5     Lower extremity   5/5 Tone and bulk:normal tone throughout; no atrophy noted Sensory: Pinprick and light touch intact throughout, bilaterally Deep Tendon Reflexes: 2+ and symmetric throughout Plantars: Right: downgoing   Left: downgoing Cerebellar: normal finger-to-nose, no appendicular ataxia noted  Gait:Deferred    Lab Results: Basic Metabolic Panel:  Recent Labs Lab 04/18/15 1835 04/18/15 1843  NA 140 140  K 4.0 3.9  CL 109 107  CO2 23  --   GLUCOSE 95 90  BUN 8 7  CREATININE 0.99 1.00  CALCIUM 9.4  --     Liver Function Tests:  Recent Labs Lab 04/18/15 1835  AST 18  ALT 11*  ALKPHOS 103  BILITOT 0.3  PROT 6.8  ALBUMIN 3.5   No results for input(s):  LIPASE, AMYLASE in the last 168 hours. No results for input(s): AMMONIA in the last 168 hours.  CBC:  Recent Labs Lab 04/18/15 1835 04/18/15 1843  WBC 6.7  --   NEUTROABS 2.2  --   HGB 14.9 15.3  HCT 42.0 45.0  MCV 92.1  --   PLT 195  --     Cardiac Enzymes: No results for input(s): CKTOTAL, CKMB, CKMBINDEX, TROPONINI in the last 168 hours.  Lipid Panel: No results for input(s): CHOL, TRIG, HDL, CHOLHDL, VLDL, LDLCALC in the last 168 hours.  CBG: No results for input(s): GLUCAP in the last 168 hours.  Microbiology: No results found for this or any previous visit.  Coagulation Studies:  Recent Labs  04/18/15 1835  LABPROT 12.8  INR 0.94    Imaging: Ct Head Wo Contrast  04/18/2015  CLINICAL DATA:  Acute onset right-sided weakness, slurred speech acute onset right-sided weakness, slurred speech, facial droop, and confusion. EXAM: CT HEAD WITHOUT CONTRAST TECHNIQUE: Contiguous axial images were obtained from the base of the skull through the vertex without intravenous contrast. COMPARISON:  04/11/2013 FINDINGS: Brain: No evidence of acute infarction, hemorrhage, extra-axial collection, ventriculomegaly, or mass effect. Vascular: No hyperdense vessel or unexpected calcification. Skull: Negative for fracture or focal lesion. Sinuses/Orbits: No acute findings. Other: None. IMPRESSION: Negative noncontrast head CT. These results were called by telephone at the time of interpretation on 04/18/2015 at 6:54 pm to Dr. Gasper Lloyd, who verbally acknowledged these results. Electronically Signed   By: Myles Rosenthal M.D.   On: 04/18/2015 18:56      Assessment: 59 y.o. male Patient was brought in to evaluate for acute stroke due to symptoms of slurred speech and possible right-sided weakness. The patient and his wife are poor historians. He does not have any focal deficits on neurologic examination. Based on clinical evaluation my suspicion for an acute stroke is very low at this time. CT of  the brain did not show any acute intra cranial pathology. I recommend obtaining a stat CT angiogram of the head and neck as well as a CT perfusion of the head for further neurological evaluation. We'll direct further management if any acute pathology is noted on these studies. Aspirin 325 mg given in the ER. His blood pressures stable with systolics in  140 to 150s.  Patient appears to be drowsy. Recommend checking urine drug screen as well as serum alcohol level.  He'll be admitted for overnight observation, through the hospitalist service. We'll obtain a brain MRI study tomorrow morning prior to discharge.  Physical therapy evaluation. Check for hemoglobin A1c, lipid  Profile.  Neurology service will continue to follow up during the hospitalization please call for any further questions.

## 2015-04-18 NOTE — Anesthesia Procedure Notes (Signed)
Procedure Name: Intubation Date/Time: 04/18/2015 10:51 PM Performed by: Melina SchoolsBANKS, Zarianna Dicarlo J Pre-anesthesia Checklist: Patient identified, Emergency Drugs available, Suction available, Patient being monitored and Timeout performed Patient Re-evaluated:Patient Re-evaluated prior to inductionOxygen Delivery Method: Circle system utilized Preoxygenation: Pre-oxygenation with 100% oxygen Intubation Type: IV induction, Rapid sequence and Cricoid Pressure applied Ventilation: Mask ventilation without difficulty Laryngoscope Size: Mac and 3 Grade View: Grade II Tube type: Subglottic suction tube Tube size: 8.0 mm Number of attempts: 1 Airway Equipment and Method: Stylet Secured at: 25 cm Tube secured with: Tape Difficulty Due To: Difficult Airway-  due to edematous airway

## 2015-04-18 NOTE — ED Notes (Signed)
Patient transported to CT 

## 2015-04-18 NOTE — ED Notes (Signed)
Received pt from home via EMS with c/o increase in previous right sided deficits onset at 1745. Pt has slurred speech and aphasia, pt has increased right sided weakness and right facial droop.

## 2015-04-18 NOTE — Anesthesia Preprocedure Evaluation (Addendum)
Anesthesia Evaluation  Patient identified by MRN, date of birth, ID band Patient awake    Reviewed: Allergy & Precautions, H&P , NPO status , Patient's Chart, lab work & pertinent test results  Airway Mallampati: II  TM Distance: >3 FB Neck ROM: Full    Dental no notable dental hx. (+) Teeth Intact, Dental Advisory Given   Pulmonary Current Smoker,    Pulmonary exam normal breath sounds clear to auscultation       Cardiovascular hypertension, Pt. on medications  Rhythm:Regular Rate:Normal     Neuro/Psych CVA negative psych ROS   GI/Hepatic negative GI ROS, Neg liver ROS,   Endo/Other  negative endocrine ROS  Renal/GU negative Renal ROS  negative genitourinary   Musculoskeletal   Abdominal   Peds  Hematology negative hematology ROS (+)   Anesthesia Other Findings   Reproductive/Obstetrics negative OB ROS                            Anesthesia Physical Anesthesia Plan  ASA: III and emergent  Anesthesia Plan: General   Post-op Pain Management:    Induction: Intravenous, Rapid sequence and Cricoid pressure planned  Airway Management Planned: Oral ETT  Additional Equipment: Arterial line  Intra-op Plan:   Post-operative Plan: Post-operative intubation/ventilation  Informed Consent: I have reviewed the patients History and Physical, chart, labs and discussed the procedure including the risks, benefits and alternatives for the proposed anesthesia with the patient or authorized representative who has indicated his/her understanding and acceptance.   Dental advisory given  Plan Discussed with: CRNA  Anesthesia Plan Comments:         Anesthesia Quick Evaluation

## 2015-04-18 NOTE — ED Notes (Signed)
Pt to IR at this time

## 2015-04-18 NOTE — ED Provider Notes (Signed)
CSN: [id number]     Arrival date & time 04/18/15  1832 History   First MD Initiated Contact with Patient 04/18/15 1837    Patient evaluated immediately upon arrival No chief complaint on file.  level V caveat acuity of situation Chief Complaint weakness.  (Consider location/radiation/quality/duration/timing/severity/associated sxs/prior Treatment) HPI Patient developed worsening weakness of right right arm and right leg and increased difficulty speaking 5:45 PM today sudden onset. Code stroke call in the field by EMS. Patient has chronic right-sided weakness and difficulty speaking as result of prior stroke. No treatment prior to coming here. Past Medical History  Diagnosis Date  . Hypertension   . CVA (cerebral infarction)   . Nerve damage    Past Surgical History  Procedure Laterality Date  . Back surgery    . Hemorroidectomy    . Hip surgery     Family History  Problem Relation Age of Onset  . Hypertension Mother   . Diabetes Mother   . Heart attack Father   . Emphysema Father    Social History  Substance Use Topics  . Smoking status: Current Every Day Smoker -- 1.00 packs/day    Types: Cigarettes  . Smokeless tobacco: Not on file  . Alcohol Use: No    Review of Systems  Unable to perform ROS: Acuity of condition  Neurological: Positive for speech difficulty and weakness.      Allergies  Morphine sulfate  Home Medications   Prior to Admission medications   Medication Sig Start Date End Date Taking? Authorizing Provider  albuterol (PROVENTIL HFA;VENTOLIN HFA) 108 (90 BASE) MCG/ACT inhaler Inhale 1-2 puffs into the lungs every 6 (six) hours as needed for wheezing. 06/12/11   April Palumbo, MD  aspirin EC 81 MG tablet Take 81 mg by mouth daily.      Historical Provider, MD  gabapentin (NEURONTIN) 100 MG capsule Take 100 mg by mouth 3 (three) times daily.    Historical Provider, MD  hydrochlorothiazide (HYDRODIURIL) 25 MG tablet Take 12.5 mg by mouth daily.      Historical Provider, MD  ibuprofen (ADVIL,MOTRIN) 400 MG tablet Take 400 mg by mouth every 6 (six) hours as needed. For pain    Historical Provider, MD   Wt 192 lb 7.4 oz (87.3 kg) Physical Exam  Constitutional: He appears well-developed and well-nourished. No distress.  HENT:  Head: Normocephalic and atraumatic.  Eyes: Conjunctivae are normal. Pupils are equal, round, and reactive to light.  Neck: Neck supple. No tracheal deviation present. No thyromegaly present.  Cardiovascular: Normal rate and regular rhythm.   No murmur heard. Pulmonary/Chest: Effort normal and breath sounds normal.  Abdominal: Soft. Bowel sounds are normal. He exhibits no distension. There is no tenderness.  Musculoskeletal: Normal range of motion. He exhibits no edema or tenderness.  Neurological: He is alert. Coordination normal.  Motor strength right upper external deformity 4 over 5, all other extremities 5 over 5. Right-sided central cranial nerve VII deficit other cranial nerves II through XII grossly intact  Skin: Skin is warm and dry. No rash noted.  Psychiatric: He has a normal mood and affect.  Nursing note and vitals reviewed.   ED Course  Procedures (including critical care time) Labs Review Labs Reviewed  ETHANOL  PROTIME-INR  APTT  CBC  DIFFERENTIAL  COMPREHENSIVE METABOLIC PANEL  URINE RAPID DRUG SCREEN, HOSP PERFORMED  URINALYSIS, ROUTINE W REFLEX MICROSCOPIC (NOT AT [facility])  I-STAT CHEM 8, ED  I-STAT TROPOININ, ED    Imaging Review No results  found. I have personally reviewed and evaluated these images and lab results as part of my medical decision-making.   EKG Interpretation   Date/Time:  Saturday April 18 2015 18:49:54 EST Ventricular Rate:  62 PR Interval:  179 QRS Duration: 84 QT Interval:  411 QTC Calculation: 417 R Axis:   82 Text Interpretation:  Sinus rhythm Borderline right axis deviation  Probable anteroseptal infarct, old ST elevation suggests acute   pericarditis Since last tracing rate slower Confirmed by Ethelda ChickJACUBOWITZ  MD,  Mella Inclan [phone]) on 04/18/2015 7:52:00 PM     Patient seen by neurologist immediately upon h return to exam room from CT scanner.  7 PM patient's speech is improved over arrival here. Results for orders placed or performed during the hospital encounter of 04/18/15  Ethanol  Result Value Ref Range   Alcohol, Ethyl (B) <5 <5 mg/dL  Protime-INR  Result Value Ref Range   Prothrombin Time 12.8 11.6 - 15.2 seconds   INR 0.94 0.00 - 1.49  APTT  Result Value Ref Range   aPTT 27 24 - 37 seconds  CBC  Result Value Ref Range   WBC 6.7 4.0 - 10.5 K/uL   RBC 4.56 4.22 - 5.81 MIL/uL   Hemoglobin 14.9 13.0 - 17.0 g/dL   HCT 19.142.0 47.839.0 - 29.552.0 %   MCV 92.1 78.0 - 100.0 fL   MCH 32.7 26.0 - 34.0 pg   MCHC 35.5 30.0 - 36.0 g/dL   RDW 62.114.8 30.811.5 - 65.715.5 %   Platelets 195 150 - 400 K/uL  Differential  Result Value Ref Range   Neutrophils Relative % 32 %   Neutro Abs 2.2 1.7 - 7.7 K/uL   Lymphocytes Relative 53 %   Lymphs Abs 3.5 0.7 - 4.0 K/uL   Monocytes Relative 11 %   Monocytes Absolute 0.7 0.1 - 1.0 K/uL   Eosinophils Relative 4 %   Eosinophils Absolute 0.3 0.0 - 0.7 K/uL   Basophils Relative 0 %   Basophils Absolute 0.0 0.0 - 0.1 K/uL  Comprehensive metabolic panel  Result Value Ref Range   Sodium 140 135 - 145 mmol/L   Potassium 4.0 3.5 - 5.1 mmol/L   Chloride 109 101 - 111 mmol/L   CO2 23 22 - 32 mmol/L   Glucose, Bld 95 65 - 99 mg/dL   BUN 8 6 - 20 mg/dL   Creatinine, Ser 8.460.99 0.61 - 1.24 mg/dL   Calcium 9.4 8.9 - 96.210.3 mg/dL   Total Protein 6.8 6.5 - 8.1 g/dL   Albumin 3.5 3.5 - 5.0 g/dL   AST 18 15 - 41 U/L   ALT 11 (L) 17 - 63 U/L   Alkaline Phosphatase 103 38 - 126 U/L   Total Bilirubin 0.3 0.3 - 1.2 mg/dL   GFR calc non Af Amer >60 >60 mL/min   GFR calc Af Amer >60 >60 mL/min   Anion gap 8 5 - 15  Urine rapid drug screen (hosp performed)[facility]  Result Value Ref Range   Opiates NONE DETECTED  NONE DETECTED   Cocaine NONE DETECTED NONE DETECTED   Benzodiazepines NONE DETECTED NONE DETECTED   Amphetamines NONE DETECTED NONE DETECTED   Tetrahydrocannabinol POSITIVE (A) NONE DETECTED   Barbiturates NONE DETECTED NONE DETECTED  Urinalysis, Routine w reflex microscopic ([facility])  Result Value Ref Range   Color, Urine YELLOW YELLOW   APPearance CLEAR CLEAR   Specific Gravity, Urine >1.046 (H) 1.005 - 1.030   pH 5.5 5.0 -  8.0   Glucose, UA NEGATIVE NEGATIVE mg/dL   Hgb urine dipstick NEGATIVE NEGATIVE   Bilirubin Urine NEGATIVE NEGATIVE   Ketones, ur NEGATIVE NEGATIVE mg/dL   Protein, ur NEGATIVE NEGATIVE mg/dL   Nitrite NEGATIVE NEGATIVE   Leukocytes, UA NEGATIVE NEGATIVE  I-Stat Chem 8, ED  (not at [facility])  Result Value Ref Range   Sodium 140 135 - 145 mmol/L   Potassium 3.9 3.5 - 5.1 mmol/L   Chloride 107 101 - 111 mmol/L   BUN 7 6 - 20 mg/dL   Creatinine, Ser 1.61 0.61 - 1.24 mg/dL   Glucose, Bld 90 65 - 99 mg/dL   Calcium, Ion 0.96 0.45 - 1.30 mmol/L   TCO2 22 0 - 100 mmol/L   Hemoglobin 15.3 13.0 - 17.0 g/dL   HCT 40.9 81.1 - 91.4 %  I-stat troponin, ED (not at [facility])  Result Value Ref Range   Troponin i, poc 0.03 0.00 - 0.08 ng/mL   Comment 3           Ct Angio Head W/cm &/or Wo Cm  04/18/2015  CLINICAL DATA:  Slurred speech and questionable facial droop, RIGHT-sided weakness beginning at 5:45 p.m. today. 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 EXAM: CT PERFUSION HEAD WITH CONTRAST CT ANGIOGRAPHY OF THE HEAD AND NECK TECHNIQUE: Multidetector CT imaging of the head and neck was performed using the standard protocol during bolus administration of intravenous contrast. Multiplanar CT image reconstructions and MIPs were obtained to evaluate the vascular anatomy. Carotid stenosis measurements (when applicable) are obtained utilizing NASCET criteria, using  the distal internal carotid diameter as the denominator. CT for fusion with post processed off line imaging. CONTRAST:  OMNIPAQUE IOHEXOL 350 MG/ML SOLN COMPARISON:  CT head April 18, 2015 at 1836 hours FINDINGS: CT PERFUSION Increased mean transit time LEFT frontal lobe and anterior LEFT parietal lobe, with maintained cerebral blood volume compatible with number/ischemia in the LEFT middle cerebral artery territory. CTA NECK Normal appearance of the thoracic arch, normal branch pattern. The origins of the innominate, left Common carotid artery and subclavian artery are widely patent. Eccentric calcific atherosclerosis of the LEFT subclavian artery origin. Bilateral Common carotid arteries are widely patent, coursing in a straight line fashion. Eccentric calcific atherosclerosis in intimal thickening of the RIGHT carotid bulb, without hemodynamically significant stenosis. Expanded low-density thrombus fluid within the LEFT internal carotid artery from the bifurcation cephalad, completely excluded. RIGHT vertebral artery is dominant. Normal appearance of the vertebral arteries, which appear widely patent. Extrinsic deformity due to degenerative cervical spine. No dissection, no pseudoaneurysm. No abnormal luminal irregularity. No contrast extravasation. Soft tissues are unremarkable. Straightened cervical lordosis, status post C4-5, C5-6 and C6-7 ACDF, non incorporated fusion material. Uncovertebral hypertrophy resulting severe C5-6 and C6-7 neural foraminal narrowing. Congenital canal stenosis with multilevel moderate neural canal to severe canal stenosis. CTA HEAD Anterior circulation: Widely patent RIGHT cervical internal carotid arteries, petrous, cavernous and supra clinoid internal carotid arteries. 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 The LEFT internal carotid artery is occluded to the level of the cavernous carotid were there is thready, presumably  retrograde flow. Widely patent anterior communicating artery. Normal appearance of the anterior cerebral arteries. Moderate stenosis LEFT M1 segment. Occluded LEFT M2 segment. Occluded LEFT M 3 segment. Posterior circulation: Normal appearance of the vertebral arteries, vertebrobasilar junction and basilar artery, as  well as main branch vessels. Robust bilateral posterior communicating arteries present. Normal appearance of the posterior cerebral arteries. 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 Drainage to the RIGHT vein of Labbe better characterized on prior catheter angiogram, narrowing of the distal cortical vein, equivocal for stenosis. IMPRESSION: CT PERFUSION: Compensated LEFT middle cerebral artery territory ischemia (penumbra). CTA NECK: Occluded LEFT internal carotid artery at the origin, which appears acute. Atherosclerosis without hemodynamically significant stenosis RIGHT ICA. 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 Multilevel severe neural foraminal narrowing and moderate to severe canal stenosis. CTA HEAD: Retrograde flow into distal LEFT internal carotid artery. Moderate stenosis LEFT M1. 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 Acute findings discussed with and reconfirmed by Dr.Sumner, neurology on 04/18/2015 at 9:15pm. Electronically Signed   By: Awilda Metro M.D.   On: 04/18/2015 21:21   Ct Head Wo Contrast  04/18/2015  CLINICAL DATA:  Acute onset right-sided weakness, slurred speech acute onset right-sided weakness, slurred speech, facial droop, and confusion. EXAM: CT HEAD WITHOUT CONTRAST TECHNIQUE: Contiguous axial  images were obtained from the base of the skull through the vertex without intravenous contrast. COMPARISON:  04/11/2013 FINDINGS: Brain: No evidence of acute infarction, hemorrhage, extra-axial collection, ventriculomegaly, or mass effect. Vascular: No hyperdense vessel or unexpected calcification. Skull: Negative for fracture or focal lesion. Sinuses/Orbits: No acute findings. Other: None. IMPRESSION: Negative noncontrast head CT. These results were called by telephone at the time of interpretation on 04/18/2015 at 6:54 pm to Dr. Gasper Lloyd, who verbally acknowledged these results. Electronically Signed   By: Myles Rosenthal M.D.   On: 04/18/2015 18:56   Ct Angio Neck W/cm &/or Wo/cm  04/18/2015  CLINICAL DATA:  Slurred speech and questionable facial droop, RIGHT-sided weakness beginning at 5:45 p.m. today. 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 EXAM: CT PERFUSION HEAD WITH CONTRAST CT ANGIOGRAPHY OF THE HEAD AND NECK TECHNIQUE: Multidetector CT imaging of the head and neck was performed using the standard protocol during bolus administration of intravenous contrast. Multiplanar CT image reconstructions and MIPs were obtained to evaluate the vascular anatomy. Carotid stenosis measurements (when applicable) are obtained utilizing NASCET criteria, using the distal internal carotid diameter as the denominator. CT for fusion with post processed off line imaging. CONTRAST:  OMNIPAQUE IOHEXOL 350 MG/ML SOLN COMPARISON:  CT head April 18, 2015 at 1836 hours FINDINGS: CT PERFUSION Increased mean transit time LEFT frontal lobe and anterior LEFT parietal lobe, with maintained cerebral blood volume compatible with number/ischemia in the LEFT middle cerebral artery territory. CTA NECK Normal appearance of the thoracic arch, normal branch pattern. The origins of the innominate, left Common carotid artery and  subclavian artery are widely patent. Eccentric calcific atherosclerosis of the LEFT subclavian artery origin. Bilateral Common carotid arteries are widely patent, coursing in a straight line fashion. Eccentric calcific atherosclerosis in intimal thickening of the RIGHT carotid bulb, without hemodynamically significant stenosis. Expanded low-density thrombus fluid within the LEFT internal carotid artery from the bifurcation cephalad, completely excluded. RIGHT vertebral artery is dominant. Normal appearance of the vertebral arteries, which appear widely patent. Extrinsic deformity due to degenerative cervical  spine. No dissection, no pseudoaneurysm. No abnormal luminal irregularity. No contrast extravasation. Soft tissues are unremarkable. Straightened cervical lordosis, status post C4-5, C5-6 and C6-7 ACDF, non incorporated fusion material. Uncovertebral hypertrophy resulting severe C5-6 and C6-7 neural foraminal narrowing. Congenital canal stenosis with multilevel moderate neural canal to severe canal stenosis. CTA HEAD Anterior circulation: Widely patent RIGHT cervical internal carotid arteries, petrous, cavernous and supra clinoid internal carotid arteries. 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 The LEFT internal carotid artery is occluded to the level of the cavernous carotid were there is thready, presumably retrograde flow. Widely patent anterior communicating artery. Normal appearance of the anterior cerebral arteries. Moderate stenosis LEFT M1 segment. Occluded LEFT M2 segment. Occluded LEFT M 3 segment. Posterior circulation: Normal appearance of the vertebral arteries, vertebrobasilar junction and basilar artery, as well as main branch vessels. Robust bilateral posterior communicating arteries present. Normal appearance of the posterior cerebral arteries. 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 Drainage to the RIGHT vein of Labbe better characterized on prior catheter angiogram, narrowing of the distal cortical vein, equivocal for stenosis. IMPRESSION: CT PERFUSION: Compensated LEFT middle cerebral artery territory ischemia (penumbra). CTA NECK: Occluded LEFT internal carotid artery at the origin, which appears acute. Atherosclerosis without hemodynamically significant stenosis RIGHT ICA. 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 Multilevel severe neural foraminal narrowing and moderate to severe canal stenosis. CTA HEAD: Retrograde flow into distal LEFT internal carotid artery. Moderate stenosis LEFT M1. 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 Acute findings discussed with and reconfirmed by Dr.Sumner, neurology on 04/18/2015 at 9:15pm. Electronically Signed   By: Awilda Metro M.D.   On: 04/18/2015 21:21   Ct Cerebral Perfusion W/cm  04/18/2015  CLINICAL DATA:  Slurred speech and questionable facial droop, RIGHT-sided weakness beginning at 5:45 p.m. today. 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 EXAM: CT PERFUSION HEAD WITH CONTRAST CT ANGIOGRAPHY OF THE HEAD AND NECK TECHNIQUE: Multidetector CT imaging of the head and neck was performed using the standard protocol during bolus administration of intravenous contrast. Multiplanar CT image reconstructions and MIPs were obtained to evaluate the vascular anatomy. Carotid stenosis measurements (when applicable) are obtained utilizing NASCET criteria, using the distal internal carotid diameter as the denominator. CT for fusion with post processed off  line imaging. CONTRAST:  OMNIPAQUE IOHEXOL 350 MG/ML SOLN COMPARISON:  CT head April 18, 2015 at 1836 hours FINDINGS: CT PERFUSION Increased mean transit time LEFT frontal lobe and anterior LEFT parietal lobe, with maintained cerebral blood volume compatible with number/ischemia in the LEFT middle cerebral artery territory. CTA NECK Normal appearance of the thoracic arch, normal branch pattern. The origins of the innominate, left Common carotid artery and subclavian artery are widely patent. Eccentric calcific atherosclerosis of the LEFT subclavian artery origin. Bilateral Common carotid arteries are widely patent, coursing in a straight line fashion. Eccentric calcific atherosclerosis in intimal thickening of the RIGHT carotid bulb, without hemodynamically significant stenosis. Expanded low-density thrombus fluid within the LEFT internal carotid artery from the bifurcation cephalad, completely excluded. RIGHT vertebral artery is dominant. Normal appearance of the vertebral  arteries, which appear widely patent. Extrinsic deformity due to degenerative cervical spine. No dissection, no pseudoaneurysm. No abnormal luminal irregularity. No contrast extravasation. Soft tissues are unremarkable. Straightened cervical lordosis, status post C4-5, C5-6 and C6-7 ACDF, non incorporated fusion material. Uncovertebral hypertrophy resulting severe C5-6 and C6-7 neural foraminal narrowing. Congenital canal stenosis with multilevel moderate neural canal to severe canal stenosis. CTA HEAD Anterior circulation: Widely patent RIGHT cervical internal carotid arteries, petrous, cavernous and supra clinoid internal carotid arteries. 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 The LEFT internal carotid artery is occluded to the level of the cavernous carotid were there is thready, presumably retrograde flow. Widely patent anterior communicating artery. Normal appearance of the anterior  cerebral arteries. Moderate stenosis LEFT M1 segment. Occluded LEFT M2 segment. Occluded LEFT M 3 segment. Posterior circulation: Normal appearance of the vertebral arteries, vertebrobasilar junction and basilar artery, as well as main branch vessels. Robust bilateral posterior communicating arteries present. Normal appearance of the posterior cerebral arteries. 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 Drainage to the RIGHT vein of Labbe better characterized on prior catheter angiogram, narrowing of the distal cortical vein, equivocal for stenosis. IMPRESSION: CT PERFUSION: Compensated LEFT middle cerebral artery territory ischemia (penumbra). CTA NECK: Occluded LEFT internal carotid artery at the origin, which appears acute. Atherosclerosis without hemodynamically significant stenosis RIGHT ICA. 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 Multilevel severe neural foraminal narrowing and moderate to severe canal stenosis. CTA HEAD: Retrograde flow into distal LEFT internal carotid artery. Moderate stenosis LEFT M1. 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 Acute findings discussed with and reconfirmed by Dr.Sumner, neurology on 04/18/2015 at 9:15pm. Electronically Signed   By: Awilda Metro M.D.   On: 04/18/2015 21:21    MDM  Dr. Hosie Poisson, neurologist arranged for admission to neurologic intensive care unit following treatment at interventional radiology suite Diagnosis acute thrombotic stroke Final diagnoses:  None    CRITICAL CARE Performed by: Doug Sou Total critical care time: 30 minutes Critical care time was exclusive of  separately billable procedures and treating other patients. Critical care was necessary to treat or prevent imminent or life-threatening deterioration. Critical care was time spent personally by me on the following activities: development of treatment plan with patient and/or surrogate as well as nursing, discussions with consultants, evaluation of patient's response to treatment, examination of patient, obtaining history from patient or surrogate, ordering and performing treatments and interventions, ordering and review of laboratory studies, ordering and review of radiographic studies, pulse oximetry and re-evaluation of patient's condition.    Doug Sou, MD 04/18/15 2223

## 2015-04-19 ENCOUNTER — Inpatient Hospital Stay (HOSPITAL_COMMUNITY): Payer: Medicaid Other

## 2015-04-19 ENCOUNTER — Other Ambulatory Visit: Payer: Self-pay | Admitting: Neurology

## 2015-04-19 DIAGNOSIS — I63232 Cerebral infarction due to unspecified occlusion or stenosis of left carotid arteries: Secondary | ICD-10-CM

## 2015-04-19 DIAGNOSIS — I639 Cerebral infarction, unspecified: Secondary | ICD-10-CM

## 2015-04-19 DIAGNOSIS — J9601 Acute respiratory failure with hypoxia: Secondary | ICD-10-CM

## 2015-04-19 DIAGNOSIS — E785 Hyperlipidemia, unspecified: Secondary | ICD-10-CM

## 2015-04-19 DIAGNOSIS — I6789 Other cerebrovascular disease: Secondary | ICD-10-CM

## 2015-04-19 LAB — BLOOD GAS, ARTERIAL
Acid-base deficit: 0.7 mmol/L (ref 0.0–2.0)
Acid-base deficit: 13 mmol/L — ABNORMAL HIGH (ref 0.0–2.0)
Bicarbonate: 22.6 mEq/L (ref 20.0–24.0)
Bicarbonate: 11.2 mEq/L — ABNORMAL LOW (ref 20.0–24.0)
DRAWN BY: 418751
Drawn by: 418751
FIO2: 0.6
FIO2: 0.6
MECHVT: 640 mL
MECHVT: 640 mL
O2 SAT: 99.3 %
O2 Saturation: 99.2 %
PATIENT TEMPERATURE: 94.6
PATIENT TEMPERATURE: 98.6
PCO2 ART: 18.9 mmHg — AB (ref 35.0–45.0)
PEEP/CPAP: 5 cmH2O
PEEP: 5 cmH2O
PO2 ART: 132 mmHg — AB (ref 80.0–100.0)
PO2 ART: 180 mmHg — AB (ref 80.0–100.0)
RATE: 16 resp/min
RATE: 12 resp/min
TCO2: 23.5 mmol/L (ref 0–100)
TCO2: 11.8 mmol/L (ref 0–100)
pCO2 arterial: 28.6 mmHg — ABNORMAL LOW (ref 35.0–45.0)
pH, Arterial: 7.497 — ABNORMAL HIGH (ref 7.350–7.450)
pH, Arterial: 7.39 (ref 7.350–7.450)

## 2015-04-19 LAB — BASIC METABOLIC PANEL
ANION GAP: 7 (ref 5–15)
BUN: 6 mg/dL (ref 6–20)
CHLORIDE: 107 mmol/L (ref 101–111)
CO2: 23 mmol/L (ref 22–32)
Calcium: 8.5 mg/dL — ABNORMAL LOW (ref 8.9–10.3)
Creatinine, Ser: 1.06 mg/dL (ref 0.61–1.24)
GFR calc non Af Amer: >60 mL/min (ref >60)
Glucose, Bld: 109 mg/dL — ABNORMAL HIGH (ref 65–99)
POTASSIUM: 3.4 mmol/L — AB (ref 3.5–5.1)
Sodium: 137 mmol/L (ref 135–145)

## 2015-04-19 LAB — CBC WITH DIFFERENTIAL/PLATELET
BASOS ABS: 0 10*3/uL (ref 0.0–0.1)
BASOS PCT: 0 %
Eosinophils Absolute: 0 10*3/uL (ref 0.0–0.7)
Eosinophils Relative: 0 %
HEMATOCRIT: 37.1 % — AB (ref 39.0–52.0)
HEMOGLOBIN: 12.7 g/dL — AB (ref 13.0–17.0)
Lymphocytes Relative: 23 %
Lymphs Abs: 1.8 10*3/uL (ref 0.7–4.0)
MCH: 31.5 pg (ref 26.0–34.0)
MCHC: 34.2 g/dL (ref 30.0–36.0)
MCV: 92.1 fL (ref 78.0–100.0)
Monocytes Absolute: 0.7 10*3/uL (ref 0.1–1.0)
Monocytes Relative: 9 %
NEUTROS ABS: 5.3 10*3/uL (ref 1.7–7.7)
NEUTROS PCT: 68 %
Platelets: 186 10*3/uL (ref 150–400)
RBC: 4.03 MIL/uL — AB (ref 4.22–5.81)
RDW: 14.7 % (ref 11.5–15.5)
WBC: 7.8 10*3/uL (ref 4.0–10.5)

## 2015-04-19 LAB — LIPID PANEL
CHOL/HDL RATIO: 5.9 ratio
CHOLESTEROL: 166 mg/dL (ref 0–200)
HDL: 28 mg/dL — AB (ref >40)
LDL Cholesterol: 101 mg/dL — ABNORMAL HIGH (ref 0–99)
Triglycerides: 183 mg/dL — ABNORMAL HIGH (ref <150)
VLDL: 37 mg/dL (ref 0–40)

## 2015-04-19 LAB — PLATELET INHIBITION P2Y12: Platelet Function  P2Y12: 7 [PRU] — ABNORMAL LOW (ref 194–418)

## 2015-04-19 LAB — MRSA PCR SCREENING: MRSA BY PCR: NEGATIVE

## 2015-04-19 LAB — TRIGLYCERIDES: Triglycerides: 187 mg/dL — ABNORMAL HIGH (ref <150)

## 2015-04-19 MED ORDER — SODIUM CHLORIDE 0.9 % IV SOLN
INTRAVENOUS | Status: DC
  Administered 2015-04-19: 19:00:00 via INTRAVENOUS

## 2015-04-19 MED ORDER — IOHEXOL 300 MG/ML  SOLN
150.0000 mL | Freq: Once | INTRAMUSCULAR | Status: DC | PRN
  Administered 2015-04-19: 250 mL via INTRA_ARTERIAL
  Filled 2015-04-19: qty 150

## 2015-04-19 MED ORDER — EPTIFIBATIDE 20 MG/10ML IV SOLN
10.0000 mL | Freq: Once | INTRAVENOUS | Status: AC
  Administered 2015-04-19: 4 mg via INTRAVENOUS
  Administered 2015-04-19: 2 mg via INTRAVENOUS
  Administered 2015-04-19: 6 mg via INTRAVENOUS
  Administered 2015-04-19: 4 mg via INTRAVENOUS

## 2015-04-19 MED ORDER — ASPIRIN 325 MG PO TABS
325.0000 mg | ORAL_TABLET | Freq: Every day | ORAL | Status: DC
  Administered 2015-04-19 – 2015-04-21 (×3): 325 mg via ORAL
  Filled 2015-04-19 (×3): qty 1

## 2015-04-19 MED ORDER — ATORVASTATIN CALCIUM 80 MG PO TABS
80.0000 mg | ORAL_TABLET | Freq: Every day | ORAL | Status: DC
  Administered 2015-04-20: 80 mg via ORAL
  Filled 2015-04-19: qty 1

## 2015-04-19 MED ORDER — NITROGLYCERIN 1 MG/10 ML FOR IR/CATH LAB
100.0000 ug | Freq: Once | INTRA_ARTERIAL | Status: AC
  Administered 2015-04-18 – 2015-04-19 (×2): 25 ug via INTRA_ARTERIAL
  Administered 2015-04-19: 50 ug via INTRA_ARTERIAL

## 2015-04-19 MED ORDER — FENTANYL CITRATE (PF) 100 MCG/2ML IJ SOLN
100.0000 ug | INTRAMUSCULAR | Status: DC | PRN
  Filled 2015-04-19: qty 2

## 2015-04-19 MED ORDER — ACETAMINOPHEN 650 MG RE SUPP: 650.0000 mg | Freq: Four times a day (QID) | RECTAL | Status: DC | PRN

## 2015-04-19 MED ORDER — MIDAZOLAM HCL 2 MG/2ML IJ SOLN
INTRAMUSCULAR | Status: DC | PRN
  Administered 2015-04-19: 2 mg via INTRAVENOUS

## 2015-04-19 MED ORDER — HYDRALAZINE HCL 20 MG/ML IJ SOLN
10.0000 mg | INTRAMUSCULAR | Status: DC | PRN
  Administered 2015-04-20: 10 mg via INTRAVENOUS
  Filled 2015-04-19 (×2): qty 1

## 2015-04-19 MED ORDER — PNEUMOCOCCAL VAC POLYVALENT 25 MCG/0.5ML IJ INJ
0.5000 mL | INJECTION | INTRAMUSCULAR | Status: AC
  Administered 2015-04-20: 0.5 mL via INTRAMUSCULAR
  Filled 2015-04-19: qty 0.5

## 2015-04-19 MED ORDER — NICARDIPINE HCL IN NACL 40-0.83 MG/200ML-% IV SOLN: 0.0000 mg/h | INTRAVENOUS | Status: DC

## 2015-04-19 MED ORDER — FAMOTIDINE IN NACL 20-0.9 MG/50ML-% IV SOLN
20.0000 mg | Freq: Two times a day (BID) | INTRAVENOUS | Status: DC
  Administered 2015-04-19 – 2015-04-20 (×3): 20 mg via INTRAVENOUS
  Filled 2015-04-19 (×3): qty 50

## 2015-04-19 MED ORDER — CLOPIDOGREL BISULFATE 75 MG PO TABS
75.0000 mg | ORAL_TABLET | Freq: Every day | ORAL | Status: DC
  Administered 2015-04-19 – 2015-04-21 (×3): 75 mg via ORAL
  Filled 2015-04-19 (×3): qty 1

## 2015-04-19 MED ORDER — INFLUENZA VAC SPLIT QUAD 0.5 ML IM SUSY
0.5000 mL | PREFILLED_SYRINGE | INTRAMUSCULAR | Status: AC
  Administered 2015-04-20: 0.5 mL via INTRAMUSCULAR

## 2015-04-19 MED ORDER — NICARDIPINE HCL IN NACL 20-0.86 MG/200ML-% IV SOLN: 5.0000 mg/h | INTRAVENOUS | Status: DC

## 2015-04-19 MED ORDER — EPTIFIBATIDE 20 MG/10ML IV SOLN
INTRAVENOUS | Status: AC
  Administered 2015-04-19: 4 mg via INTRAVENOUS
  Filled 2015-04-19: qty 10

## 2015-04-19 MED ORDER — PROPOFOL 1000 MG/100ML IV EMUL
0.0000 ug/kg/min | INTRAVENOUS | Status: DC
  Administered 2015-04-19: 25 ug/kg/min via INTRAVENOUS
  Administered 2015-04-19: 20 ug/kg/min via INTRAVENOUS
  Filled 2015-04-19: qty 100

## 2015-04-19 MED ORDER — NICARDIPINE HCL IN NACL 20-0.86 MG/200ML-% IV SOLN
0.0000 mg/h | INTRAVENOUS | Status: DC
  Administered 2015-04-19: 5 mg/h via INTRAVENOUS
  Filled 2015-04-19: qty 200

## 2015-04-19 MED ORDER — PERFLUTREN LIPID MICROSPHERE
1.0000 mL | INTRAVENOUS | Status: AC | PRN
  Administered 2015-04-19: 2 mL via INTRAVENOUS
  Filled 2015-04-19: qty 10

## 2015-04-19 MED ORDER — FENTANYL CITRATE (PF) 100 MCG/2ML IJ SOLN
100.0000 ug | INTRAMUSCULAR | Status: DC | PRN
Start: 2015-04-19 — End: 2015-04-20
  Administered 2015-04-19: 100 ug via INTRAVENOUS

## 2015-04-19 MED ORDER — ONDANSETRON HCL 4 MG/2ML IJ SOLN: 4.0000 mg | Freq: Four times a day (QID) | INTRAMUSCULAR | Status: DC | PRN

## 2015-04-19 MED ORDER — ACETAMINOPHEN 500 MG PO TABS: 1000.0000 mg | ORAL_TABLET | Freq: Four times a day (QID) | ORAL | Status: DC | PRN

## 2015-04-19 NOTE — Procedures (Signed)
S/P Lt common carotid arteriogram Followed by revascularization of prox MCA and superior division of LT MCA with x 1 pass with Solitaire 4 mm x 40 mm FR retrievel devic e and 6 mg of  Superselective.IA integrelin and complete revascularization ogf occluded LT ICA prox with stent assisted angioplasty and 110 mg og of IA integrelin. TICI 2b reascularization achieved of the lt MCA. Also given 375 mg of Plavix and 650 mg of aspirin po via NG tube  20 mins prior to stent placement

## 2015-04-19 NOTE — Procedures (Signed)
Extubation Procedure Note  Patient Details:   Name: Craig Holden DOB: 10-07-1955 MRN: [id number]   Airway Documentation:     Evaluation  O2 sats: stable throughout Complications: No apparent complications Patient did tolerate procedure well. Bilateral Breath Sounds: Clear, Diminished Suctioning: Oral, Airway Yes, pt able to speak.  + cuff leak test prior to extubation.  BBS[facility] clear coarse diminished, no distress noted. PT denies SOB.  No stridor noted.  Jennette KettleBrowning, Zareth Rippetoe Joy 04/19/2015, 10:03 AM

## 2015-04-19 NOTE — Progress Notes (Signed)
eLink Physician-Brief Progress Note Patient Name: Edmund HildaJames B Koepp DOB: 12/05/1955 MRN: [id number]   Date of Service  04/19/2015  HPI/Events of Note  ABG on 60%/PRVC 16/TV 550/P 5 = 7.49/28.6/132/22.6.  eICU Interventions  Will decrease PRVC rate to 12 and recheck ABG at 5 AM.     Intervention Category Major Interventions: Respiratory failure - evaluation and management  Sommer,Steven Eugene 04/19/2015, 4:04 AM

## 2015-04-19 NOTE — Anesthesia Postprocedure Evaluation (Signed)
  Anesthesia Post-op Note  Patient: Craig Holden  Procedure(s) Performed: Procedure(s): RADIOLOGY WITH ANESTHESIA (N/A)  Patient Location: ICU  Anesthesia Type:General  Level of Consciousness: sedated and unresponsive  Airway and Oxygen Therapy: Patient remains intubated and on ventilator  Post-op Pain: none  Post-op Assessment: Post-op Vital signs reviewed, Patient's Cardiovascular Status Stable and Respiratory Function Stable  Post-op Vital Signs: Reviewed  Filed Vitals:   04/19/15 0745  BP:   Pulse:   Temp: 36.4 C  Resp:     Complications: No apparent anesthesia complications

## 2015-04-19 NOTE — Progress Notes (Signed)
Subjective: S/p (L)MCA revasc and (L)ICA stent angioplasty for (L)MCA Just extubated this am. See by Dr. Corliss Skainseveshwar  Objective: Physical Exam: BP 126/66 mmHg  Pulse 83  Temp(Src) 97.5 F (36.4 C) (Axillary)  Resp 13  Ht 6\' 2"  (1.88 m)  Wt 188 lb 7.9 oz (85.5 kg)  BMI 24.19 kg/m2  SpO2 100% Awake, alert PERRLA, EOMI Decreased strength and coordination of (R)UE (R)femoral sheath intact, no hematoma Foot warm, good pulse  Labs: CBC  Recent Labs  04/18/15 1835 04/18/15 1843 04/19/15 0730  WBC 6.7  --  7.8  HGB 14.9 15.3 12.7*  HCT 42.0 45.0 37.1*  PLT 195  --  186   BMET  Recent Labs  04/18/15 1835 04/18/15 1843 04/19/15 0730  NA 140 140 137  K 4.0 3.9 3.4*  CL 109 107 107  CO2 23  --  23  GLUCOSE 95 90 109*  BUN 8 7 6   CREATININE 0.99 1.00 1.06  CALCIUM 9.4  --  8.5*   LFT  Recent Labs  04/18/15 1835  PROT 6.8  ALBUMIN 3.5  AST 18  ALT 11*  ALKPHOS 103  BILITOT 0.3   PT/INR  Recent Labs  04/18/15 1835  LABPROT 12.8  INR 0.94     Studies/Results: Ct Angio Head W/cm &/or Wo Cm  04/18/2015  CLINICAL DATA:  Slurred speech and questionable facial droop, RIGHT-sided weakness beginning at 5:45 p.m. today. 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 EXAM: CT PERFUSION HEAD WITH CONTRAST CT ANGIOGRAPHY OF THE HEAD AND NECK TECHNIQUE: Multidetector CT imaging of the head and neck was performed using the standard protocol during bolus administration of intravenous contrast. Multiplanar CT image reconstructions and MIPs were obtained to evaluate the vascular anatomy. Carotid stenosis measurements (when applicable) are obtained utilizing NASCET criteria, using the distal internal carotid diameter as the denominator. CT for fusion with post processed off line imaging. CONTRAST:  100mL OMNIPAQUE IOHEXOL 350 MG/ML SOLN COMPARISON:  CT head April 18, 2015 at  1836 hours FINDINGS: CT PERFUSION Increased mean transit time LEFT frontal lobe and anterior LEFT parietal lobe, with maintained cerebral blood volume compatible with number/ischemia in the LEFT middle cerebral artery territory. CTA NECK Normal appearance of the thoracic arch, normal branch pattern. The origins of the innominate, left Common carotid artery and subclavian artery are widely patent. Eccentric calcific atherosclerosis of the LEFT subclavian artery origin. Bilateral Common carotid arteries are widely patent, coursing in a straight line fashion. Eccentric calcific atherosclerosis in intimal thickening of the RIGHT carotid bulb, without hemodynamically significant stenosis. Expanded low-density thrombus fluid within the LEFT internal carotid artery from the bifurcation cephalad, completely excluded. RIGHT vertebral artery is dominant. Normal appearance of the vertebral arteries, which appear widely patent. Extrinsic deformity due to degenerative cervical spine. No dissection, no pseudoaneurysm. No abnormal luminal irregularity. No contrast extravasation. Soft tissues are unremarkable. Straightened cervical lordosis, status post C4-5, C5-6 and C6-7 ACDF, non incorporated fusion material. Uncovertebral hypertrophy resulting severe C5-6 and C6-7 neural foraminal narrowing. Congenital canal stenosis with multilevel moderate neural canal to severe canal stenosis. CTA HEAD Anterior circulation: Widely patent RIGHT cervical internal carotid arteries, petrous, cavernous and supra clinoid internal carotid arteries. 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 The LEFT internal carotid artery is occluded to the level of the cavernous carotid were there is thready, presumably retrograde flow. Widely patent anterior  communicating artery. Normal appearance of the anterior cerebral arteries. Moderate stenosis LEFT M1 segment. Occluded LEFT M2 segment. Occluded LEFT M 3 segment.  Posterior circulation: Normal appearance of the vertebral arteries, vertebrobasilar junction and basilar artery, as well as main branch vessels. Robust bilateral posterior communicating arteries present. Normal appearance of the posterior cerebral arteries. 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 Drainage to the RIGHT vein of Labbe better characterized on prior catheter angiogram, narrowing of the distal cortical vein, equivocal for stenosis. IMPRESSION: CT PERFUSION: Compensated LEFT middle cerebral artery territory ischemia (penumbra). CTA NECK: Occluded LEFT internal carotid artery at the origin, which appears acute. Atherosclerosis without hemodynamically significant stenosis RIGHT ICA. 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 Multilevel severe neural foraminal narrowing and moderate to severe canal stenosis. CTA HEAD: Retrograde flow into distal LEFT internal carotid artery. Moderate stenosis LEFT M1. 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 Acute findings discussed with and reconfirmed by Dr.Sumner, neurology on 04/18/2015 at 9:15pm. Electronically Signed   By: Awilda Metro M.D.   On: 04/18/2015 21:21   Ct Head Wo Contrast  04/19/2015  CLINICAL DATA:  Follow-up stroke. Status post revascularization of proximal LEFT internal carotid artery (stent assisted angioplasty) and MCA after embolism. EXAM: CT HEAD WITHOUT CONTRAST TECHNIQUE: Contiguous axial images were obtained from the base of the skull through the vertex without intravenous contrast. COMPARISON:  CT head April 18, 2015 FINDINGS: The ventricles and sulci are normal. No  intraparenchymal hemorrhage, mass effect nor midline shift. Subtle areas of LEFT insular gray-white matter blurring. No abnormal intracranial enhancement or contrast staining. No abnormal extra-axial fluid collections. Basal cisterns are patent. Moderate calcific atherosclerosis of the carotid siphons. No skull fracture. The included ocular globes and orbital contents are non-suspicious. Moderate paranasal sinus mucosal thickening without air-fluid levels. The mastoid air cells are well aerated. Fullness of the nasopharyngeal soft tissues with life-support lines in place. IMPRESSION: Patchy areas of suspected acute ischemia in LEFT insula/MCA territory on this CT head with contrast, status post angiogram. Electronically Signed   By: Awilda Metro M.D.   On: 04/19/2015 03:06   Ct Head Wo Contrast  04/18/2015  CLINICAL DATA:  Acute onset right-sided weakness, slurred speech acute onset right-sided weakness, slurred speech, facial droop, and confusion. EXAM: CT HEAD WITHOUT CONTRAST TECHNIQUE: Contiguous axial images were obtained from the base of the skull through the vertex without intravenous contrast. COMPARISON:  04/11/2013 FINDINGS: Brain: No evidence of acute infarction, hemorrhage, extra-axial collection, ventriculomegaly, or mass effect. Vascular: No hyperdense vessel or unexpected calcification. Skull: Negative for fracture or focal lesion. Sinuses/Orbits: No acute findings. Other: None. IMPRESSION: Negative noncontrast head CT. These results were called by telephone at the time of interpretation on 04/18/2015 at 6:54 pm to Dr. Gasper Lloyd, who verbally acknowledged these results. Electronically Signed   By: Myles Rosenthal M.D.   On: 04/18/2015 18:56   Ct Angio Neck W/cm &/or Wo/cm  04/18/2015  CLINICAL DATA:  Slurred speech and questionable facial droop, RIGHT-sided weakness beginning at 5:45 p.m. today. 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 Known 30% stenosis  LEFT M1. History of hypertension and nerve damage. EXAM: CT PERFUSION HEAD WITH CONTRAST CT  ANGIOGRAPHY OF THE HEAD AND NECK TECHNIQUE: Multidetector CT imaging of the head and neck was performed using the standard protocol during bolus administration of intravenous contrast. Multiplanar CT image reconstructions and MIPs were obtained to evaluate the vascular anatomy. Carotid stenosis measurements (when applicable) are obtained utilizing NASCET criteria, using the distal internal carotid diameter as the denominator. CT for fusion with post processed off line imaging. CONTRAST:  OMNIPAQUE IOHEXOL 350 MG/ML SOLN COMPARISON:  CT head April 18, 2015 at 1836 hours FINDINGS: CT PERFUSION Increased mean transit time LEFT frontal lobe and anterior LEFT parietal lobe, with maintained cerebral blood volume compatible with number/ischemia in the LEFT middle cerebral artery territory. CTA NECK Normal appearance of the thoracic arch, normal branch pattern. The origins of the innominate, left Common carotid artery and subclavian artery are widely patent. Eccentric calcific atherosclerosis of the LEFT subclavian artery origin. Bilateral Common carotid arteries are widely patent, coursing in a straight line fashion. Eccentric calcific atherosclerosis in intimal thickening of the RIGHT carotid bulb, without hemodynamically significant stenosis. Expanded low-density thrombus fluid within the LEFT internal carotid artery from the bifurcation cephalad, completely excluded. RIGHT vertebral artery is dominant. Normal appearance of the vertebral arteries, which appear widely patent. Extrinsic deformity due to degenerative cervical spine. No dissection, no pseudoaneurysm. No abnormal luminal irregularity. No contrast extravasation. Soft tissues are unremarkable. Straightened cervical lordosis, status post C4-5, C5-6 and C6-7 ACDF, non incorporated fusion material. Uncovertebral hypertrophy resulting severe C5-6 and C6-7 neural  foraminal narrowing. Congenital canal stenosis with multilevel moderate neural canal to severe canal stenosis. CTA HEAD Anterior circulation: Widely patent RIGHT cervical internal carotid arteries, petrous, cavernous and supra clinoid internal carotid arteries. 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 The LEFT internal carotid artery is occluded to the level of the cavernous carotid were there is thready, presumably retrograde flow. Widely patent anterior communicating artery. Normal appearance of the anterior cerebral arteries. Moderate stenosis LEFT M1 segment. Occluded LEFT M2 segment. Occluded LEFT M 3 segment. Posterior circulation: Normal appearance of the vertebral arteries, vertebrobasilar junction and basilar artery, as well as main branch vessels. Robust bilateral posterior communicating arteries present. Normal appearance of the posterior cerebral arteries. 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 Drainage to the RIGHT vein of Labbe better characterized on prior catheter angiogram, narrowing of the distal cortical vein, equivocal for stenosis. IMPRESSION: CT PERFUSION: Compensated LEFT middle cerebral artery territory ischemia (penumbra). CTA NECK: Occluded LEFT internal carotid artery at the origin, which appears acute. Atherosclerosis without hemodynamically significant stenosis RIGHT ICA. 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 Multilevel severe neural foraminal narrowing and moderate to severe canal stenosis. CTA HEAD: Retrograde flow into distal LEFT internal carotid artery. Moderate stenosis LEFT M1. 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 In addition, less than 2 mm  RIGHT internal carotid artery blister aneurysm. Acute findings discussed with and reconfirmed by Dr.Sumner, neurology on 04/18/2015 at 9:15pm. Electronically Signed   By: Awilda Metro M.D.   On: 04/18/2015 21:21   Ct Cerebral Perfusion W/cm  04/18/2015  CLINICAL DATA:  Slurred speech and questionable facial droop, RIGHT-sided weakness beginning at 5:45 p.m. today. 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 History of  hypertension and nerve damage. EXAM: CT PERFUSION HEAD WITH CONTRAST CT ANGIOGRAPHY OF THE HEAD AND NECK TECHNIQUE: Multidetector CT imaging of the head and neck was performed using the standard protocol during bolus administration of intravenous contrast. Multiplanar CT image reconstructions and MIPs were obtained to evaluate the vascular anatomy. Carotid stenosis measurements (when applicable) are obtained utilizing NASCET criteria, using the distal internal carotid diameter as the denominator. CT for fusion with post processed off line imaging. CONTRAST:  OMNIPAQUE IOHEXOL 350 MG/ML SOLN COMPARISON:  CT head April 18, 2015 at 1836 hours FINDINGS: CT PERFUSION Increased mean transit time LEFT frontal lobe and anterior LEFT parietal lobe, with maintained cerebral blood volume compatible with number/ischemia in the LEFT middle cerebral artery territory. CTA NECK Normal appearance of the thoracic arch, normal branch pattern. The origins of the innominate, left Common carotid artery and subclavian artery are widely patent. Eccentric calcific atherosclerosis of the LEFT subclavian artery origin. Bilateral Common carotid arteries are widely patent, coursing in a straight line fashion. Eccentric calcific atherosclerosis in intimal thickening of the RIGHT carotid bulb, without hemodynamically significant stenosis. Expanded low-density thrombus fluid within the LEFT internal carotid artery from the bifurcation cephalad, completely  excluded. RIGHT vertebral artery is dominant. Normal appearance of the vertebral arteries, which appear widely patent. Extrinsic deformity due to degenerative cervical spine. No dissection, no pseudoaneurysm. No abnormal luminal irregularity. No contrast extravasation. Soft tissues are unremarkable. Straightened cervical lordosis, status post C4-5, C5-6 and C6-7 ACDF, non incorporated fusion material. Uncovertebral hypertrophy resulting severe C5-6 and C6-7 neural foraminal narrowing. Congenital canal stenosis with multilevel moderate neural canal to severe canal stenosis. CTA HEAD Anterior circulation: Widely patent RIGHT cervical internal carotid arteries, petrous, cavernous and supra clinoid internal carotid arteries. 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 The LEFT internal carotid artery is occluded to the level of the cavernous carotid were there is thready, presumably retrograde flow. Widely patent anterior communicating artery. Normal appearance of the anterior cerebral arteries. Moderate stenosis LEFT M1 segment. Occluded LEFT M2 segment. Occluded LEFT M 3 segment. Posterior circulation: Normal appearance of the vertebral arteries, vertebrobasilar junction and basilar artery, as well as main branch vessels. Robust bilateral posterior communicating arteries present. Normal appearance of the posterior cerebral arteries. 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 Drainage to the RIGHT vein of Labbe better characterized on prior catheter angiogram, narrowing of the distal cortical vein, equivocal for stenosis. IMPRESSION: CT PERFUSION: Compensated LEFT middle cerebral artery territory ischemia (penumbra). CTA NECK: Occluded LEFT internal carotid artery at the origin, which appears acute. Atherosclerosis without hemodynamically  significant stenosis RIGHT ICA. 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 Multilevel severe neural foraminal narrowing and moderate to severe canal stenosis. CTA HEAD: Retrograde flow into distal LEFT internal carotid artery. Moderate stenosis LEFT M1. 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 Acute findings discussed with and reconfirmed by Dr.Sumner, neurology on 04/18/2015 at 9:15pm. Electronically Signed   By: Awilda Metro M.D.   On: 04/18/2015 21:21    Assessment/Plan: [facility] CVA S/p revascularization of prox MCA and superior division of LT MCA with x 1 pass with Solitaire 4 mm x 40 mm FR retrievel devic e and 6  mg of Superselective.IA integrelin and complete revascularization ogf occluded LT ICA prox with stent assisted angioplasty and 110 mg og of IA integrelin. TICI 2b reascularization achieved of the lt MCA Plavix and ASA daily, pt has received this am Will DC sheath today OK to remove neck collar Pt for MR this am    LOS: 1 day    Brayton El PA-C 04/19/2015 10:57 AM

## 2015-04-19 NOTE — Progress Notes (Signed)
Pt placed on SBT initially, but changed  10 PSV d/t pt still on propofol and RN unsure what plan is re: extubation.  Waiting on MD to round.  Pt tol PSV well so far, RN aware.

## 2015-04-19 NOTE — Progress Notes (Addendum)
PT Cancellation Note  Patient Details Name: Edmund HildaJames B Poppell MRN: [id number] DOB: [birth year]   Cancelled Treatment:    Reason Eval/Treat Not Completed: Patient not medically ready. Pt is intubated and has order for bedrest.   ETA 1123: New PT order received. Pt just extubated at 1000. Femoral sheath still in place. PT to follow up tomorrow.   Ilda FoilGarrow, Tej Murdaugh Rene 04/19/2015, 7:56 AM

## 2015-04-19 NOTE — Progress Notes (Signed)
SLP Cancellation Note  Patient Details Name: Edmund HildaJames B Brumm MRN: [id number] DOB: 07/27/1955   Cancelled treatment:        Pt failed RN swallow screen. Currently unable to sit up due to femoral sheath (plans to have removed sometime today- will need to lie flat 4 hours after). Will follow up next date.    Royce MacadamiaLitaker, Esabella Stockinger Willis 04/19/2015, 12:34 PM

## 2015-04-19 NOTE — Progress Notes (Signed)
  Echocardiogram 2D Echocardiogram with Definity has been performed.  Craig Holden 04/19/2015, 9:10 AM

## 2015-04-19 NOTE — Progress Notes (Signed)
Utilization review completed.  

## 2015-04-19 NOTE — Progress Notes (Signed)
Reported blood gases to KellGretchen, Charity fundraiserN. Also notified about patients low BP post fentanyl adminstration

## 2015-04-19 NOTE — Progress Notes (Signed)
eLink Physician-Brief Progress Note Patient Name: Craig Holden DOB: 24-Jan-1956 MRN: [id number]   Date of Service  04/19/2015  HPI/Events of Note  Request for bilateral wrist restraints.   eICU Interventions  Will order bilateral wrist restraints.     Intervention Category Minor Interventions: Agitation / anxiety - evaluation and management  Gayla Benn Eugene 04/19/2015, 4:51 AM

## 2015-04-19 NOTE — Progress Notes (Signed)
Neuro IR Rt 9 french femoral sheath removed at 1320.  Manual pressure and VPAD used to achieve hemostasis at 1345. Distal pulses intact. No complications.  Site reviewed with Meghan RN and pressure dressing applied.  Isamar Wellbrock FallsRT-R [employer] & McLennanKris Hines RT-R

## 2015-04-19 NOTE — Progress Notes (Signed)
STROKE TEAM PROGRESS NOTE   HISTORY Craig Holden is an 59 y.o. male who was noted by his daughter and wife at about 5:45 PM having slurred speech and questionable facial droop and possible right-sided weakness. He was transferred to the ER through EMS for here to evaluate for acute stroke. Per history from patient and his wife, he had a stroke in 2012 affecting his right side although denies any residual deficits. He recently had cervical spine fusion surgery in September or October 2016 for his wife. They both are poor historians. The patient was on oxycodone and apparently he ran out of them, on November 1st. He takes gabapentin 800 mg 3 times a day.  Date last known well: Date: 04/18/2015 Time last known well: Time: 17:45 tPA Given: No: No focal findings on examination to suggest an acute stroke clinically.   Dr Corliss Skains S/P Lt common carotid arteriogram Followed by revascularization of prox MCA and superior division of LT MCA with x 1 pass with Solitaire 4 mm x 40 mm FR retrievel devic e and 6 mg of Superselective.IA integrelin and complete revascularization ogf occluded LT ICA prox with stent assisted angioplasty and 110 mg og of IA integrelin. TICI 2b reascularization achieved of the lt MCA.   SUBJECTIVE (INTERVAL HISTORY) His RN is at the bedside.  Overall he feels his condition is stable. He was agitated on vent last night, received fentanyl on top of propofol and making BP drop to as low as 57/39. This am BP 120-140. No cardene was given.   Pt this morning still on propofol and sleepy drowsy but following commands. Likely to be extubated.    OBJECTIVE Temp:  [97.5 F (36.4 C)-98.8 F (37.1 C)] 97.5 F (36.4 C) (11/20 0745) Pulse Rate:  [53-98] 79 (11/20 0645) Cardiac Rhythm:  [-]  Resp:  [12-28] 13 (11/20 0645) BP: (57-179)/(39-103) 94/68 mmHg (11/20 0645) SpO2:  [94 %-100 %] 99 % (11/20 0645) Arterial Line BP: (77-197)/(43-101) 106/60 mmHg (11/20 0645) FiO2 (%):  [40  %-60 %] 40 % (11/20 0545) Weight:  [85.5 kg (188 lb 7.9 oz)-87.3 kg (192 lb 7.4 oz)] 85.5 kg (188 lb 7.9 oz) (11/20 0300)  CBC:  Recent Labs Lab 04/18/15 1835 04/18/15 1843  WBC 6.7  --   NEUTROABS 2.2  --   HGB 14.9 15.3  HCT 42.0 45.0  MCV 92.1  --   PLT 195  --     Basic Metabolic Panel:  Recent Labs Lab 04/18/15 1835 04/18/15 1843  NA 140 140  K 4.0 3.9  CL 109 107  CO2 23  --   GLUCOSE 95 90  BUN 8 7  CREATININE 0.99 1.00  CALCIUM 9.4  --     Lipid Panel:    Component Value Date/Time   CHOL 166 04/19/2015 0515   TRIG 183* 04/19/2015 0515   TRIG 187* 04/19/2015 0515   HDL 28* 04/19/2015 0515   CHOLHDL 5.9 04/19/2015 0515   VLDL 37 04/19/2015 0515   LDLCALC 101* 04/19/2015 0515   HgbA1c:  Lab Results  Component Value Date   HGBA1C  08/05/2009    5.7 (NOTE) The ADA recommends the following therapeutic goal for glycemic control related to Hgb A1c measurement: Goal of therapy: <6.5 Hgb A1c  Reference: American Diabetes Association: Clinical Practice Recommendations 2010, Diabetes Care, 2010, 33: (Suppl  1).   Urine Drug Screen:    Component Value Date/Time   LABOPIA NONE DETECTED 04/18/2015 2113   COCAINSCRNUR NONE DETECTED 04/18/2015  2113   LABBENZ NONE DETECTED 04/18/2015 2113   AMPHETMU NONE DETECTED 04/18/2015 2113   THCU POSITIVE* 04/18/2015 2113   LABBARB NONE DETECTED 04/18/2015 2113      IMAGING I have personally reviewed the radiological images below and agree with the radiology interpretations.  Ct Angio Head and Neck  W/cm &/or Wo Cm 04/18/2015   IMPRESSION:  CT PERFUSION: Compensated LEFT middle cerebral artery territory ischemia (penumbra).  CTA NECK:  Occluded LEFT internal carotid artery at the origin, which appears acute. Atherosclerosis without hemodynamically significant stenosis  RIGHT ICA. 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 Multilevel severe neural foraminal narrowing and moderate to severe canal  stenosis.  CTA HEAD: Retrograde flow into distal LEFT internal carotid artery. Moderate stenosis LEFT M1. 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 Complete circle of Willis.  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   Ct Head Wo Contrast 04/19/2015   IMPRESSION:  Patchy areas of suspected acute ischemia in LEFT insula/MCA territory on this CT head with contrast, status post angiogram.   04/18/2015   IMPRESSION:  Negative noncontrast head CT.   2D echo - pending  MRI brain and MRA head - pending   PHYSICAL EXAM  Temp:  [97.5 F (36.4 C)-98.8 F (37.1 C)] 97.5 F (36.4 C) (11/20 0745) Pulse Rate:  [53-98] 83 (11/20 1002) Resp:  [12-28] 13 (11/20 1002) BP: (57-179)/(39-103) 126/66 mmHg (11/20 0815) SpO2:  [94 %-100 %] 100 % (11/20 1002) Arterial Line BP: (77-197)/(43-101) 117/67 mmHg (11/20 0800) FiO2 (%):  [40 %-60 %] 40 % (11/20 0815) Weight:  [188 lb 7.9 oz (85.5 kg)-192 lb 7.4 oz (87.3 kg)] 188 lb 7.9 oz (85.5 kg) (11/20 0300)  General - Well nourished, well developed, intubated on propofol.  Ophthalmologic - Fundi not visualized due to noncooperation.  Cardiovascular - Regular rate and rhythm.  Neuro - intubated on propofol, sleepy and drowsy, but able to open eyes on voice and following central commands, and some peripheral commands. PERRL, EOMI, attending to both sides, no neglect, corneal and gag present. LUE spontaneous movement against gravity, RUE 0/5 proximal but 3/5 distal, BLE spontaneous movement left against gravity but right not against gravity. DTR 1+ and no babinski, sensation, coordination and gait not able to test.    ASSESSMENT/PLAN [name] is a 59 y.o. male with history of tobacco use, substance abuse, hypertension, previous CVA, and nerve damage presenting with slurred speech and questionable facial droop and  possible right-sided weakness.  He did not receive IV t-PA due to minimal deficits.   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 aphasia  MRI - Pending  MRA - Pending  CTA head and neck - left ICA occlusion, left M2 occlusion, right temporal AVM  Cerebral angio - TICI2b recannulization of left M2, left ICA s/p stent  2D Echo - Pending  LDL 101  HgbA1c pending  VTE prophylaxis - SCDs  Diet NPO time specified  aspirin 81 mg daily prior to admission, now on aspirin 325 mg daily and clopidogrel 75 mg daily. Continue dual antiplatelet due to ICA stent  Patient counseled to be compliant with his antithrombotic medications  Ongoing aggressive stroke risk factor management  Therapy recommendations:  Pending  Disposition: Pending  Hypertension/hypotension  Blood pressure running  low due to sedative medications   currently not on scheduled antihypertensive medications  Permissive hypertension (OK if < 220/120) but gradually normalize in 5-7 days  D/c cardene orders  Hyperlipidemia  Home meds: No lipid lowering medications prior to admission  LDL 101, goal < 70  Now on Lipitor 80 mg daily  Continue statin at discharge  Tobacco abuse  Current smoker  Smoking cessation counseling will be provided  Other Stroke Risk Factors  Advanced age  Hx stroke/TIA - 2011 involving right side weakness but no residue  Marijuana use  Other Active Problems  Extubation today  Hospital day # 1  This patient is critically ill due to left MCA infarct with left ICA and MCA occlusion and at significant risk of neurological worsening, death form recurrent infarct, cerebral edema, hemorrhagic transformation. This patient's care requires constant monitoring of vital signs, hemodynamics, respiratory and cardiac monitoring, review of multiple databases, neurological assessment,  discussion with family, other specialists and medical decision making of high complexity. I spent 40 minutes of neurocritical care time in the care of this patient.  Marvel Plan, MD PhD Stroke Neurology 04/19/2015 11:07 AM      To contact Stroke Continuity provider, please refer to [email or web address]. After hours, contact General Neurology

## 2015-04-19 NOTE — Transfer of Care (Signed)
Immediate Anesthesia Transfer of Care Note  Patient: Craig Holden  Procedure(s) Performed: Procedure(s): RADIOLOGY WITH ANESTHESIA (N/A)  Patient Location: ICU  Anesthesia Type:General  Level of Consciousness: sedated and Patient remains intubated per anesthesia plan  Airway & Oxygen Therapy: Patient remains intubated per anesthesia plan and Patient placed on Ventilator (see vital sign flow sheet for setting)  Post-op Assessment: Report given to RN and Post -op Vital signs reviewed and stable  Post vital signs: Reviewed and stable  Last Vitals:  Filed Vitals:   04/18/15 2230  BP: 139/71  Pulse: 58  Temp:   Resp: 17    Complications: No apparent anesthesia complications

## 2015-04-19 NOTE — Progress Notes (Signed)
PULMONARY / CRITICAL CARE MEDICINE   Name: Craig Holden MRN: [id number] DOB: 05-Jun-1955    ADMISSION DATE:  04/18/2015 CONSULTATION DATE:  04/19/15  REFERRING MD :  Hosie Poisson  CHIEF COMPLAINT:  S/p CVA  HISTORY OF PRESENT ILLNESS:   Patient is a 59yo male who presented to the ED with right arm and right leg weakness accompanied by difficulty speaking/slurred speech that began at approximately 17:45 yesterday, 11/19. The information listed is per report as the patient is currently sedated and intubated and unable to provide any information. EMS was called. He was evaluated by neurology for an acute stroke. CT showed acute occlusion of left ICA with L MCA penumbra on the perfusion study. He was taken to IR for a diagnostic angiogram with possible intervention. The vessel was revascularized; stent placed with angioplasty.   SUBJECTIVE:  Followed commands on WUA this am Tolerating PSV 10 currently  VITAL SIGNS: BP 115/83 mmHg  Pulse 77  Temp(Src) 97.5 F (36.4 C) (Axillary)  Resp 12  Ht  (1.88 m)  Wt 85.5 kg (188 lb 7.9 oz)  BMI 24.19 kg/m2  SpO2 99%  HEMODYNAMICS:    VENTILATOR SETTINGS: Vent Mode:  [-] PRVC FiO2 (%):  [40 %-60 %] 40 % Set Rate:  [12 bmp-16 bmp] 12 bmp Vt Set:  [640 mL] 640 mL PEEP:  [5 cmH20] 5 cmH20 Plateau Pressure:  [20 cmH20] 20 cmH20  INTAKE / OUTPUT: I/O last 3 completed shifts: In: 1893.5 [I.V.:1893.5] Out: 750 [Emesis/NG output:750]  PHYSICAL EXAMINATION: General:  59 year old male, who appears stated age, is in no acute distress, and is currently sedated and intubated. He does not follow commands.  Neuro:  He is sedated on propofol. PERRL but sluggish. Moves all ext w stim; followed commands on WUA this am HEENT: Head, normocephalic, atraumatic. Ears symmetric, permeable. Eyes: no conjunctival icterus, no erythema. Nose: permeable, midline septum, Clear oropharynx, ETT in place Cardiovascular: S1S2 RRR no murmurs, rubs, or gallops  auscultated. Distal pulses palpable Lungs:  Chest symmetrical with respirations, Coarse diffuse bilateral breath sounds both on inspiration and expiration  Abdomen:  Soft, nontender, no guarding, nondistended. Present bowel sounds. Musculoskeletal:  No muscle atrophy noted nor weakness. ROM not assessed.No swelling nor tenderness of the joints.  Skin:  No notable scars, rashes, cruises. No bed sores noted.  Lymphatics: no palpable lymphadenopathy of cervical, supraclvicular nor inguinal areas.    LABS:  CBC  Recent Labs Lab 04/18/15 1835 04/18/15 1843 04/19/15 0730  WBC 6.7  --  7.8  HGB 14.9 15.3 12.7*  HCT 42.0 45.0 37.1*  PLT 195  --  186   Coag's  Recent Labs Lab 04/18/15 1835  APTT 27  INR 0.94   BMET  Recent Labs Lab 04/18/15 1835 04/18/15 1843  NA 140 140  K 4.0 3.9  CL 109 107  CO2 23  --   BUN 8 7  CREATININE 0.99 1.00  GLUCOSE 95 90   Electrolytes  Recent Labs Lab 04/18/15 1835  CALCIUM 9.4   Sepsis Markers No results for input(s): LATICACIDVEN, PROCALCITON, O2SATVEN in the last 168 hours. ABG  Recent Labs Lab 04/19/15 0323 04/19/15 0527  PHART 7.497* 7.390  PCO2ART 28.6* 18.9*  PO2ART 132* 180*   Liver Enzymes  Recent Labs Lab 04/18/15 1835  AST 18  ALT 11*  ALKPHOS 103  BILITOT 0.3  ALBUMIN 3.5   Cardiac Enzymes No results for input(s): TROPONINI, PROBNP in the last 168 hours. Glucose No results  for input(s): GLUCAP in the last 168 hours.  Imaging Ct Angio Head W/cm &/or Wo Cm  04/18/2015  CLINICAL DATA:  Slurred speech and questionable facial droop, RIGHT-sided weakness beginning at 5:45 p.m. today. 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 EXAM: CT PERFUSION HEAD WITH CONTRAST CT ANGIOGRAPHY OF THE HEAD AND NECK TECHNIQUE: Multidetector CT imaging of the head and neck was performed using the standard protocol during  bolus administration of intravenous contrast. Multiplanar CT image reconstructions and MIPs were obtained to evaluate the vascular anatomy. Carotid stenosis measurements (when applicable) are obtained utilizing NASCET criteria, using the distal internal carotid diameter as the denominator. CT for fusion with post processed off line imaging. CONTRAST:  OMNIPAQUE IOHEXOL 350 MG/ML SOLN COMPARISON:  CT head April 18, 2015 at 1836 hours FINDINGS: CT PERFUSION Increased mean transit time LEFT frontal lobe and anterior LEFT parietal lobe, with maintained cerebral blood volume compatible with number/ischemia in the LEFT middle cerebral artery territory. CTA NECK Normal appearance of the thoracic arch, normal branch pattern. The origins of the innominate, left Common carotid artery and subclavian artery are widely patent. Eccentric calcific atherosclerosis of the LEFT subclavian artery origin. Bilateral Common carotid arteries are widely patent, coursing in a straight line fashion. Eccentric calcific atherosclerosis in intimal thickening of the RIGHT carotid bulb, without hemodynamically significant stenosis. Expanded low-density thrombus fluid within the LEFT internal carotid artery from the bifurcation cephalad, completely excluded. RIGHT vertebral artery is dominant. Normal appearance of the vertebral arteries, which appear widely patent. Extrinsic deformity due to degenerative cervical spine. No dissection, no pseudoaneurysm. No abnormal luminal irregularity. No contrast extravasation. Soft tissues are unremarkable. Straightened cervical lordosis, status post C4-5, C5-6 and C6-7 ACDF, non incorporated fusion material. Uncovertebral hypertrophy resulting severe C5-6 and C6-7 neural foraminal narrowing. Congenital canal stenosis with multilevel moderate neural canal to severe canal stenosis. CTA HEAD Anterior circulation: Widely patent RIGHT cervical internal carotid arteries, petrous, cavernous and supra clinoid  internal carotid arteries. 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 The LEFT internal carotid artery is occluded to the level of the cavernous carotid were there is thready, presumably retrograde flow. Widely patent anterior communicating artery. Normal appearance of the anterior cerebral arteries. Moderate stenosis LEFT M1 segment. Occluded LEFT M2 segment. Occluded LEFT M 3 segment. Posterior circulation: Normal appearance of the vertebral arteries, vertebrobasilar junction and basilar artery, as well as main branch vessels. Robust bilateral posterior communicating arteries present. Normal appearance of the posterior cerebral arteries. 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 Drainage to the RIGHT vein of Labbe better characterized on prior catheter angiogram, narrowing of the distal cortical vein, equivocal for stenosis. IMPRESSION: CT PERFUSION: Compensated LEFT middle cerebral artery territory ischemia (penumbra). CTA NECK: Occluded LEFT internal carotid artery at the origin, which appears acute. Atherosclerosis without hemodynamically significant stenosis RIGHT ICA. 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 Multilevel severe neural foraminal narrowing and moderate to severe canal stenosis. CTA HEAD: Retrograde flow into distal LEFT internal carotid artery. Moderate stenosis LEFT M1. 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 Acute findings discussed with and  reconfirmed by Dr.Sumner, neurology on 04/18/2015 at 9:15pm. Electronically Signed   By: Awilda Metroourtnay  Bloomer M.D.   On: 04/18/2015 21:21   Ct Head Wo  Contrast  04/19/2015  CLINICAL DATA:  Follow-up stroke. Status post revascularization of proximal LEFT internal carotid artery (stent assisted angioplasty) and MCA after embolism. EXAM: CT HEAD WITHOUT CONTRAST TECHNIQUE: Contiguous axial images were obtained from the base of the skull through the vertex without intravenous contrast. COMPARISON:  CT head April 18, 2015 FINDINGS: The ventricles and sulci are normal. No intraparenchymal hemorrhage, mass effect nor midline shift. Subtle areas of LEFT insular gray-white matter blurring. No abnormal intracranial enhancement or contrast staining. No abnormal extra-axial fluid collections. Basal cisterns are patent. Moderate calcific atherosclerosis of the carotid siphons. No skull fracture. The included ocular globes and orbital contents are non-suspicious. Moderate paranasal sinus mucosal thickening without air-fluid levels. The mastoid air cells are well aerated. Fullness of the nasopharyngeal soft tissues with life-support lines in place. IMPRESSION: Patchy areas of suspected acute ischemia in LEFT insula/MCA territory on this CT head with contrast, status post angiogram. Electronically Signed   By: Awilda Metroourtnay  Bloomer M.D.   On: 04/19/2015 03:06   Ct Head Wo Contrast  04/18/2015  CLINICAL DATA:  Acute onset right-sided weakness, slurred speech acute onset right-sided weakness, slurred speech, facial droop, and confusion. EXAM: CT HEAD WITHOUT CONTRAST TECHNIQUE: Contiguous axial images were obtained from the base of the skull through the vertex without intravenous contrast. COMPARISON:  04/11/2013 FINDINGS: Brain: No evidence of acute infarction, hemorrhage, extra-axial collection, ventriculomegaly, or mass effect. Vascular: No hyperdense vessel or unexpected calcification. Skull: Negative for fracture or focal lesion. Sinuses/Orbits: No acute findings. Other: None. IMPRESSION: Negative noncontrast head CT. These results were called by telephone at the time of  interpretation on 04/18/2015 at 6:54 pm to Dr. Gasper LloydNandigan, who verbally acknowledged these results. Electronically Signed   By: Myles RosenthalJohn  Stahl M.D.   On: 04/18/2015 18:56   Ct Angio Neck W/cm &/or Wo/cm  04/18/2015  CLINICAL DATA:  Slurred speech and questionable facial droop, RIGHT-sided weakness beginning at 5:45 p.m. today. 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 EXAM: CT PERFUSION HEAD WITH CONTRAST CT ANGIOGRAPHY OF THE HEAD AND NECK TECHNIQUE: Multidetector CT imaging of the head and neck was performed using the standard protocol during bolus administration of intravenous contrast. Multiplanar CT image reconstructions and MIPs were obtained to evaluate the vascular anatomy. Carotid stenosis measurements (when applicable) are obtained utilizing NASCET criteria, using the distal internal carotid diameter as the denominator. CT for fusion with post processed off line imaging. CONTRAST:  100mL OMNIPAQUE IOHEXOL 350 MG/ML SOLN COMPARISON:  CT head April 18, 2015 at 1836 hours FINDINGS: CT PERFUSION Increased mean transit time LEFT frontal lobe and anterior LEFT parietal lobe, with maintained cerebral blood volume compatible with number/ischemia in the LEFT middle cerebral artery territory. CTA NECK Normal appearance of the thoracic arch, normal branch pattern. The origins of the innominate, left Common carotid artery and subclavian artery are widely patent. Eccentric calcific atherosclerosis of the LEFT subclavian artery origin. Bilateral Common carotid arteries are widely patent, coursing in a straight line fashion. Eccentric calcific atherosclerosis in intimal thickening of the RIGHT carotid bulb, without hemodynamically significant stenosis. Expanded low-density thrombus fluid within the LEFT internal carotid artery from the bifurcation cephalad, completely excluded. RIGHT vertebral artery is dominant.  Normal appearance of the vertebral arteries, which appear  widely patent. Extrinsic deformity due to degenerative cervical spine. No dissection, no pseudoaneurysm. No abnormal luminal irregularity. No contrast extravasation. Soft tissues are unremarkable. Straightened cervical lordosis, status post C4-5, C5-6 and C6-7 ACDF, non incorporated fusion material. Uncovertebral hypertrophy resulting severe C5-6 and C6-7 neural foraminal narrowing. Congenital canal stenosis with multilevel moderate neural canal to severe canal stenosis. CTA HEAD Anterior circulation: Widely patent RIGHT cervical internal carotid arteries, petrous, cavernous and supra clinoid internal carotid arteries. 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 The LEFT internal carotid artery is occluded to the level of the cavernous carotid were there is thready, presumably retrograde flow. Widely patent anterior communicating artery. Normal appearance of the anterior cerebral arteries. Moderate stenosis LEFT M1 segment. Occluded LEFT M2 segment. Occluded LEFT M 3 segment. Posterior circulation: Normal appearance of the vertebral arteries, vertebrobasilar junction and basilar artery, as well as main branch vessels. Robust bilateral posterior communicating arteries present. Normal appearance of the posterior cerebral arteries. 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 Drainage to the RIGHT vein of Labbe better characterized on prior catheter angiogram, narrowing of the distal cortical vein, equivocal for stenosis. IMPRESSION: CT PERFUSION: Compensated LEFT middle cerebral artery territory ischemia (penumbra). CTA NECK: Occluded LEFT internal carotid artery at the origin, which appears acute. Atherosclerosis without hemodynamically significant stenosis RIGHT ICA. Congenital  canal stenosis, status post C4-5 through C6-7 ACDF. Multilevel severe neural foraminal narrowing and moderate to severe canal stenosis. CTA HEAD: Retrograde flow into distal LEFT internal carotid artery. Moderate stenosis LEFT M1. 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 Acute findings discussed with and reconfirmed by Dr.Sumner, neurology on 04/18/2015 at 9:15pm. Electronically Signed   By: Awilda Metro M.D.   On: 04/18/2015 21:21   Ct Cerebral Perfusion W/cm  04/18/2015  CLINICAL DATA:  Slurred speech and questionable facial droop, RIGHT-sided weakness beginning at 5:45 p.m. today. 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 EXAM: CT PERFUSION HEAD WITH CONTRAST CT ANGIOGRAPHY OF THE HEAD AND NECK TECHNIQUE: Multidetector CT imaging of the head and neck was performed using the standard protocol during bolus administration of intravenous contrast. Multiplanar CT image reconstructions and MIPs were obtained to evaluate the vascular anatomy. Carotid stenosis measurements (when applicable) are obtained utilizing NASCET criteria, using the distal internal carotid diameter as the denominator. CT for fusion with post processed off line imaging. CONTRAST:  OMNIPAQUE IOHEXOL 350 MG/ML SOLN COMPARISON:  CT head April 18, 2015 at 1836 hours FINDINGS: CT PERFUSION Increased mean transit time LEFT frontal lobe and anterior LEFT parietal lobe, with maintained cerebral blood volume compatible with number/ischemia in the LEFT middle cerebral artery territory. CTA NECK Normal appearance of the thoracic arch, normal branch pattern. The origins of the innominate, left Common carotid artery and subclavian artery are  widely patent. Eccentric calcific atherosclerosis of the LEFT subclavian artery origin. Bilateral Common carotid arteries are widely patent, coursing in a straight line fashion. Eccentric calcific atherosclerosis in intimal thickening of the RIGHT carotid bulb, without hemodynamically significant stenosis. Expanded low-density thrombus fluid within the LEFT internal carotid artery from the bifurcation cephalad, completely excluded. RIGHT vertebral  artery is dominant. Normal appearance of the vertebral arteries, which appear widely patent. Extrinsic deformity due to degenerative cervical spine. No dissection, no pseudoaneurysm. No abnormal luminal irregularity. No contrast extravasation. Soft tissues are unremarkable. Straightened cervical lordosis, status post C4-5, C5-6 and C6-7 ACDF, non incorporated fusion material. Uncovertebral hypertrophy resulting severe C5-6 and C6-7 neural foraminal narrowing. Congenital canal stenosis with multilevel moderate neural canal to severe canal stenosis. CTA HEAD Anterior circulation: Widely patent RIGHT cervical internal carotid arteries, petrous, cavernous and supra clinoid internal carotid arteries. 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 The LEFT internal carotid artery is occluded to the level of the cavernous carotid were there is thready, presumably retrograde flow. Widely patent anterior communicating artery. Normal appearance of the anterior cerebral arteries. Moderate stenosis LEFT M1 segment. Occluded LEFT M2 segment. Occluded LEFT M 3 segment. Posterior circulation: Normal appearance of the vertebral arteries, vertebrobasilar junction and basilar artery, as well as main branch vessels. Robust bilateral posterior communicating arteries present. Normal appearance of the posterior cerebral arteries. 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 Drainage to the RIGHT vein of Labbe better characterized on prior catheter angiogram, narrowing of the distal cortical vein, equivocal for stenosis. IMPRESSION: CT PERFUSION: Compensated LEFT middle cerebral artery territory ischemia (penumbra). CTA NECK: Occluded LEFT internal carotid artery at the origin, which appears acute. Atherosclerosis without hemodynamically significant stenosis RIGHT ICA. 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 Multilevel severe neural foraminal narrowing and moderate to severe canal stenosis. CTA HEAD: Retrograde flow into distal LEFT internal carotid artery. Moderate stenosis LEFT M1. 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 Acute findings discussed with and reconfirmed by Dr.Sumner, neurology on 04/18/2015 at 9:15pm. Electronically Signed   By: Awilda Metro M.D.   On: 04/18/2015 21:21    STUDIES:  Ct as listed above  CULTURES: n/a  ANTIBIOTICS: n/a  SIGNIFICANT EVENTS: S/p IR procedure  LINES/TUBES: ETT 11/20 >>  Femoral art sheath 11/20 >> 11/20   ASSESSMENT / PLAN:  PULMONARY A: Acute respiratory failure 2/2 CVA Tobacco abuse P:  Vent management, titrate FiO2, RR Plan to extubate this am after TTE completed.   CARDIOVASCULAR A: Hypertension Chronic diastolic heart failure per echo 07/2009 P: 2d echo being performed now  Systolic BP goal 500-938. Use PRN hydralazine and HR <60 and would like to avoid beta blockers. Started patient on nicardipine On losartan 100 and hctz 25 at home, restart when able  RENAL A:   No active issues P:     GASTROINTESTINAL A:   No active issues P:     HEMATOLOGIC A:   No active issues P:    INFECTIOUS A:   No active  issues P:     ENDOCRINE A:   No active issues    P:     NEUROLOGIC A:  Acute L Carotid artery occlusion with CVA s/p stent and angioplasty  P:  ASA/statin/plavix.  PT/OT.  Intermittent fentanyl with continuous propofol for sedation, anticipate d/c 11/20 for extubation RASS goal: 0    FAMILY  - Updates:   - Inter-disciplinary family meet or Palliative Care meeting due by:  11/27  Critical Care time: 40 minutes  Molly Maduro  Delton Coombes, MD, PhD 04/19/2015, 8:50 AM [facility] [phone] or if no answer [phone]

## 2015-04-19 NOTE — Consult Note (Signed)
PULMONARY / CRITICAL CARE MEDICINE   Name: Craig Holden MRN: [id number] DOB: 08-10-1955    ADMISSION DATE:  04/18/2015 CONSULTATION DATE:  04/19/15  REFERRING MD :  Hosie Poisson  CHIEF COMPLAINT:  S/p CVA  HISTORY OF PRESENT ILLNESS:   Patient is a 59yo male who presented to the ED with right arm and right leg weakness accompanied by difficulty speaking/slurred speech that began at approximately 17:45 yesterday, 11/19. The information listed is per report as the patient is currently sedated and intubated and unable to provide any information. EMS was called. He was evaluated by neurology for an acute stroke. CT showed acute occlusion of left ICA with L MCA penumbra on the perfusion study. He was taken to IR for a diagnostic angiogram with possible intervention. The vessel was revascularized; stent placed with angioplasty.   PAST MEDICAL HISTORY :  He  has a past medical history of Hypertension; CVA (cerebral infarction); and Nerve damage.  PAST SURGICAL HISTORY: He  has past surgical history that includes Back surgery; Hemorroidectomy; and Hip surgery.  Allergies  Allergen Reactions  . Morphine Sulfate Nausea And Vomiting    No current facility-administered medications on file prior to encounter.   Current Outpatient Prescriptions on File Prior to Encounter  Medication Sig  . albuterol (PROVENTIL HFA;VENTOLIN HFA) 108 (90 BASE) MCG/ACT inhaler Inhale 1-2 puffs into the lungs every 6 (six) hours as needed for wheezing.  Marland Kitchen aspirin EC 81 MG tablet Take 81 mg by mouth daily.    . hydrochlorothiazide (HYDRODIURIL) 25 MG tablet Take 12.5 mg by mouth daily.     FAMILY HISTORY:  His indicated that his father is deceased.   SOCIAL HISTORY: He  reports that he has been smoking Cigarettes.  He has been smoking about 1.00 pack per day. He does not have any smokeless tobacco history on file. He reports that he uses illicit drugs (Marijuana). He reports that he does not drink  alcohol.  REVIEW OF SYSTEMS:   Unable to obtain as the patient is sedated and intubated  SUBJECTIVE:   VITAL SIGNS: BP 139/71 mmHg  Pulse 58  Temp(Src) 98.8 F (37.1 C) (Oral)  Resp 17  Wt 87.3 kg (192 lb 7.4 oz)  SpO2 95%  HEMODYNAMICS:    VENTILATOR SETTINGS:    INTAKE / OUTPUT:    PHYSICAL EXAMINATION: General:  59 year old male, who appears stated age, is in no acute distress, and is currently sedated and intubated. He does not follow commands. He appear well nourished well developed Neuro:  He is sedated on propofol. PERRL but sluggish. No EOM. He does not grimace to painful stimuli in his 4 extremities.  HEENT: Head, normocephalic, atraumatic. Ears symmetric, permeable. Eyes: no conjunctival icterus, no erythema. Nose: permeable, midline septum, Clear oropharynx, ETT in place Cardiovascular: S1S2 RRR no murmurs, rubs, or gallops auscultated. No thrills palpated. Distal pulses palpable Lungs:  Chest symmetrical with respirations, Coarse diffuse bilateral breath sounds both on inspiration and expiration  Abdomen:  Soft, nontender, no guarding, nondistended. Present bowel sounds. Musculoskeletal:  No muscle atrophy noted nor weakness. ROM not assessed.No swelling nor tenderness of the joints.  Skin:  No notable scars, rashes, cruises. No bed sores noted.  Lymphatics: no palpable lymphadenopathy of cervical, supraclvicular nor inguinal areas.    LABS:  CBC  Recent Labs Lab 04/18/15 1835 04/18/15 1843  WBC 6.7  --   HGB 14.9 15.3  HCT 42.0 45.0  PLT 195  --    Coag's  Recent Labs Lab 04/18/15 1835  APTT 27  INR 0.94   BMET  Recent Labs Lab 04/18/15 1835 04/18/15 1843  NA 140 140  K 4.0 3.9  CL 109 107  CO2 23  --   BUN 8 7  CREATININE 0.99 1.00  GLUCOSE 95 90   Electrolytes  Recent Labs Lab 04/18/15 1835  CALCIUM 9.4   Sepsis Markers No results for input(s): LATICACIDVEN, PROCALCITON, O2SATVEN in the last 168 hours. ABG No results for  input(s): PHART, PCO2ART, PO2ART in the last 168 hours. Liver Enzymes  Recent Labs Lab 04/18/15 1835  AST 18  ALT 11*  ALKPHOS 103  BILITOT 0.3  ALBUMIN 3.5   Cardiac Enzymes No results for input(s): TROPONINI, PROBNP in the last 168 hours. Glucose No results for input(s): GLUCAP in the last 168 hours.  Imaging Ct Angio Head W/cm &/or Wo Cm  04/18/2015  CLINICAL DATA:  Slurred speech and questionable facial droop, RIGHT-sided weakness beginning at 5:45 p.m. today. 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 EXAM: CT PERFUSION HEAD WITH CONTRAST CT ANGIOGRAPHY OF THE HEAD AND NECK TECHNIQUE: Multidetector CT imaging of the head and neck was performed using the standard protocol during bolus administration of intravenous contrast. Multiplanar CT image reconstructions and MIPs were obtained to evaluate the vascular anatomy. Carotid stenosis measurements (when applicable) are obtained utilizing NASCET criteria, using the distal internal carotid diameter as the denominator. CT for fusion with post processed off line imaging. CONTRAST:  OMNIPAQUE IOHEXOL 350 MG/ML SOLN COMPARISON:  CT head April 18, 2015 at 1836 hours FINDINGS: CT PERFUSION Increased mean transit time LEFT frontal lobe and anterior LEFT parietal lobe, with maintained cerebral blood volume compatible with number/ischemia in the LEFT middle cerebral artery territory. CTA NECK Normal appearance of the thoracic arch, normal branch pattern. The origins of the innominate, left Common carotid artery and subclavian artery are widely patent. Eccentric calcific atherosclerosis of the LEFT subclavian artery origin. Bilateral Common carotid arteries are widely patent, coursing in a straight line fashion. Eccentric calcific atherosclerosis in intimal thickening of the RIGHT carotid bulb, without hemodynamically significant stenosis.  Expanded low-density thrombus fluid within the LEFT internal carotid artery from the bifurcation cephalad, completely excluded. RIGHT vertebral artery is dominant. Normal appearance of the vertebral arteries, which appear widely patent. Extrinsic deformity due to degenerative cervical spine. No dissection, no pseudoaneurysm. No abnormal luminal irregularity. No contrast extravasation. Soft tissues are unremarkable. Straightened cervical lordosis, status post C4-5, C5-6 and C6-7 ACDF, non incorporated fusion material. Uncovertebral hypertrophy resulting severe C5-6 and C6-7 neural foraminal narrowing. Congenital canal stenosis with multilevel moderate neural canal to severe canal stenosis. CTA HEAD Anterior circulation: Widely patent RIGHT cervical internal carotid arteries, petrous, cavernous and supra clinoid internal carotid arteries. 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 The LEFT internal carotid artery is occluded to the level of the cavernous carotid were there is thready, presumably retrograde flow. Widely patent anterior communicating artery. Normal appearance of the anterior cerebral arteries. Moderate stenosis LEFT M1 segment. Occluded LEFT M2 segment. Occluded LEFT M 3 segment. Posterior circulation: Normal appearance of the vertebral arteries, vertebrobasilar junction and basilar artery, as well as main branch vessels. Robust bilateral posterior communicating arteries present. Normal appearance of the posterior cerebral arteries. 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 Drainage to the RIGHT vein of Labbe better characterized on prior catheter angiogram, narrowing of the distal cortical vein, equivocal for stenosis. IMPRESSION: CT PERFUSION: Compensated LEFT middle cerebral artery territory ischemia (penumbra). CTA  NECK: Occluded LEFT internal carotid artery at the origin, which appears acute. Atherosclerosis without hemodynamically significant stenosis RIGHT ICA. 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 Multilevel severe neural foraminal narrowing and moderate to severe canal stenosis. CTA HEAD: Retrograde flow into distal LEFT internal carotid artery. Moderate stenosis LEFT M1. 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 Acute findings discussed with and reconfirmed by Dr.Sumner, neurology on 04/18/2015 at 9:15pm. Electronically Signed   By: Awilda Metro M.D.   On: 04/18/2015 21:21   Ct Head Wo Contrast  04/18/2015  CLINICAL DATA:  Acute onset right-sided weakness, slurred speech acute onset right-sided weakness, slurred speech, facial droop, and confusion. EXAM: CT HEAD WITHOUT CONTRAST TECHNIQUE: Contiguous axial images were obtained from the base of the skull through the vertex without intravenous contrast. COMPARISON:  04/11/2013 FINDINGS: Brain: No evidence of acute infarction, hemorrhage, extra-axial collection, ventriculomegaly, or mass effect. Vascular: No hyperdense vessel or unexpected calcification. Skull: Negative for fracture or focal lesion. Sinuses/Orbits: No acute findings. Other: None. IMPRESSION: Negative noncontrast head CT. These results were called by telephone at the time of interpretation on 04/18/2015 at 6:54 pm to Dr. Gasper Lloyd, who verbally acknowledged these results. Electronically Signed   By: Myles Rosenthal M.D.   On: 04/18/2015 18:56   Ct Angio Neck W/cm &/or Wo/cm  04/18/2015  CLINICAL DATA:  Slurred speech and questionable facial droop, RIGHT-sided weakness beginning at 5:45 p.m. today. 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 Known  30% stenosis LEFT M1. History of hypertension and nerve damage. EXAM: CT PERFUSION HEAD WITH CONTRAST CT ANGIOGRAPHY OF THE HEAD AND NECK TECHNIQUE: Multidetector CT imaging of the head and neck was performed using the standard protocol during bolus administration of intravenous contrast. Multiplanar CT image reconstructions and MIPs were obtained to evaluate the vascular anatomy. Carotid stenosis measurements (when applicable) are obtained utilizing NASCET criteria, using the distal internal carotid diameter as the denominator. CT for fusion with post processed off line imaging. CONTRAST:  OMNIPAQUE IOHEXOL 350 MG/ML SOLN COMPARISON:  CT head April 18, 2015 at 1836 hours FINDINGS: CT PERFUSION Increased mean transit time LEFT frontal lobe and anterior LEFT parietal lobe, with maintained cerebral blood volume compatible with number/ischemia in the LEFT middle cerebral artery territory. CTA NECK Normal appearance of the thoracic arch, normal branch pattern. The origins of the innominate, left Common carotid artery and subclavian artery are widely patent. Eccentric calcific atherosclerosis of the LEFT subclavian artery origin. Bilateral Common carotid arteries are widely patent, coursing in a straight line fashion. Eccentric calcific atherosclerosis in intimal thickening of the RIGHT carotid bulb, without hemodynamically significant stenosis. Expanded low-density thrombus fluid within the LEFT internal carotid artery from the bifurcation cephalad, completely excluded. RIGHT vertebral artery is dominant. Normal appearance of the vertebral arteries, which appear widely patent. Extrinsic deformity due to degenerative cervical spine. No dissection, no pseudoaneurysm. No abnormal luminal irregularity. No contrast extravasation. Soft tissues are unremarkable. Straightened cervical lordosis, status post C4-5, C5-6 and C6-7 ACDF, non incorporated fusion material. Uncovertebral hypertrophy resulting severe C5-6 and  C6-7 neural foraminal narrowing. Congenital canal stenosis with multilevel moderate neural canal to severe canal stenosis. CTA HEAD Anterior circulation: Widely patent RIGHT cervical internal carotid arteries, petrous, cavernous and supra clinoid internal carotid arteries. 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 The LEFT internal carotid artery is occluded to the level of the cavernous carotid were there is thready, presumably retrograde flow. Widely patent anterior communicating artery. Normal appearance of the anterior cerebral arteries. Moderate stenosis LEFT M1 segment. Occluded LEFT M2 segment. Occluded LEFT M 3 segment. Posterior circulation: Normal appearance of the vertebral arteries, vertebrobasilar junction and basilar artery, as well as main branch vessels. Robust bilateral posterior communicating arteries present. Normal appearance of the posterior cerebral arteries. 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 Drainage to the RIGHT vein of Labbe better characterized on prior catheter angiogram, narrowing of the distal cortical vein, equivocal for stenosis. IMPRESSION: CT PERFUSION: Compensated LEFT middle cerebral artery territory ischemia (penumbra). CTA NECK: Occluded LEFT internal carotid artery at the origin, which appears acute. Atherosclerosis without hemodynamically significant stenosis RIGHT ICA. 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 Multilevel severe neural foraminal narrowing and moderate to severe canal stenosis. CTA HEAD: Retrograde flow into distal LEFT internal carotid artery. Moderate stenosis LEFT M1. 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 Acute findings discussed with and reconfirmed by Dr.Sumner, neurology on 04/18/2015 at 9:15pm. Electronically Signed   By: Awilda Metroourtnay  Bloomer M.D.   On: 04/18/2015 21:21   Ct Cerebral Perfusion W/cm  04/18/2015  CLINICAL DATA:  Slurred speech and questionable facial droop, RIGHT-sided weakness beginning at 5:45 p.m. today. 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 EXAM: CT PERFUSION HEAD WITH CONTRAST CT ANGIOGRAPHY OF THE HEAD AND NECK TECHNIQUE: Multidetector CT imaging of the head and neck was performed using the standard protocol during bolus administration of intravenous contrast. Multiplanar CT image reconstructions and MIPs were obtained to evaluate the vascular anatomy. Carotid stenosis measurements (when applicable) are obtained utilizing NASCET criteria, using the distal internal carotid diameter as the denominator. CT for fusion with post processed off line imaging. CONTRAST:  100mL OMNIPAQUE IOHEXOL 350 MG/ML SOLN COMPARISON:  CT head April 18, 2015 at 1836 hours FINDINGS: CT PERFUSION Increased mean transit time LEFT frontal lobe and anterior LEFT parietal lobe, with maintained cerebral blood volume compatible with number/ischemia in the LEFT middle cerebral artery territory. CTA NECK Normal appearance of the thoracic arch, normal branch pattern. The origins of the innominate, left Common carotid artery and subclavian artery are widely patent. Eccentric calcific atherosclerosis of the LEFT subclavian artery origin. Bilateral Common carotid arteries are widely patent, coursing in a straight line fashion. Eccentric calcific atherosclerosis in intimal thickening of the RIGHT carotid bulb, without hemodynamically significant stenosis. Expanded low-density thrombus fluid within the LEFT internal carotid artery from the bifurcation  cephalad, completely excluded. RIGHT vertebral artery is dominant. Normal appearance of the vertebral arteries, which appear widely patent. Extrinsic deformity due to degenerative cervical spine. No dissection, no pseudoaneurysm. No abnormal luminal irregularity. No contrast extravasation. Soft tissues are unremarkable. Straightened cervical lordosis, status post C4-5, C5-6 and C6-7  ACDF, non incorporated fusion material. Uncovertebral hypertrophy resulting severe C5-6 and C6-7 neural foraminal narrowing. Congenital canal stenosis with multilevel moderate neural canal to severe canal stenosis. CTA HEAD Anterior circulation: Widely patent RIGHT cervical internal carotid arteries, petrous, cavernous and supra clinoid internal carotid arteries. 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 The LEFT internal carotid artery is occluded to the level of the cavernous carotid were there is thready, presumably retrograde flow. Widely patent anterior communicating artery. Normal appearance of the anterior cerebral arteries. Moderate stenosis LEFT M1 segment. Occluded LEFT M2 segment. Occluded LEFT M 3 segment. Posterior circulation: Normal appearance of the vertebral arteries, vertebrobasilar junction and basilar artery, as well as main branch vessels. Robust bilateral posterior communicating arteries present. Normal appearance of the posterior cerebral arteries. 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 Drainage to the RIGHT vein of Labbe better characterized on prior catheter angiogram, narrowing of the distal cortical vein, equivocal for stenosis. IMPRESSION: CT PERFUSION: Compensated LEFT middle cerebral artery territory ischemia (penumbra). CTA NECK: Occluded LEFT internal carotid artery at the origin, which appears acute. Atherosclerosis  without hemodynamically significant stenosis RIGHT ICA. 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 Multilevel severe neural foraminal narrowing and moderate to severe canal stenosis. CTA HEAD: Retrograde flow into distal LEFT internal carotid artery. Moderate stenosis LEFT M1. 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 Acute findings discussed with and reconfirmed by Dr.Sumner, neurology on 04/18/2015 at 9:15pm. Electronically Signed   By: Awilda Metro M.D.   On: 04/18/2015 21:21     STUDIES:  Ct as listed above  CULTURES: n/a  ANTIBIOTICS: n/a  SIGNIFICANT EVENTS: S/p IR procedure  LINES/TUBES: n/a  DISCUSSION: No family immediately at bedside  ASSESSMENT / PLAN:  PULMONARY A: Acute respiratory failure 2/2 CVA Tobacco abuse P:  Vent management, titrate FiO2, RR and TV as needed. Plan to extubate in the AM.   CARDIOVASCULAR A: Hypertension Chronic diastolic heart failure per echo 07/2009 P: 2d echo in AM. Systolic BP goal 161-096. Use PRN hydralazine and HR <60 and would like to avoid beta blockers.Started patient on nicardipine drips. On losartan 100 and hctz 25 at home  RENAL A:   No active issues P:     GASTROINTESTINAL A:   No active issues P:     HEMATOLOGIC A:   No active issues P:    INFECTIOUS A:   No active issues P:     ENDOCRINE A:   No active issues    P:     NEUROLOGIC A:  Acute L Carotid artery occlusion with CVA s/p stent and angioplasty  P:  ASA/statin/plavix. PT/OT. Intermittent fentanyl with continuous propofol for sedation RASS goal: 0    FAMILY  - Updates:   - Inter-disciplinary family meet or Palliative Care meeting due by:  day 7  Critical Care time: 35 minutes spent managing HTN, titrating  nicardipine gtt, titrating ventilator, titration of sedation  Deetta Perla, MD Critical Care Medicine  Pulmonary/Critical Care Pager: [phone]  04/19/2015, 2:42 AM

## 2015-04-20 ENCOUNTER — Encounter (HOSPITAL_COMMUNITY): Payer: Self-pay | Admitting: Interventional Radiology

## 2015-04-20 ENCOUNTER — Inpatient Hospital Stay (HOSPITAL_COMMUNITY): Payer: Medicaid Other

## 2015-04-20 DIAGNOSIS — I10 Essential (primary) hypertension: Secondary | ICD-10-CM

## 2015-04-20 DIAGNOSIS — M545 Low back pain: Secondary | ICD-10-CM

## 2015-04-20 DIAGNOSIS — I5032 Chronic diastolic (congestive) heart failure: Secondary | ICD-10-CM

## 2015-04-20 DIAGNOSIS — F1210 Cannabis abuse, uncomplicated: Secondary | ICD-10-CM

## 2015-04-20 DIAGNOSIS — I69359 Hemiplegia and hemiparesis following cerebral infarction affecting unspecified side: Secondary | ICD-10-CM

## 2015-04-20 DIAGNOSIS — E785 Hyperlipidemia, unspecified: Secondary | ICD-10-CM | POA: Insufficient documentation

## 2015-04-20 DIAGNOSIS — F17200 Nicotine dependence, unspecified, uncomplicated: Secondary | ICD-10-CM | POA: Insufficient documentation

## 2015-04-20 DIAGNOSIS — G8929 Other chronic pain: Secondary | ICD-10-CM

## 2015-04-20 DIAGNOSIS — I639 Cerebral infarction, unspecified: Secondary | ICD-10-CM

## 2015-04-20 DIAGNOSIS — D62 Acute posthemorrhagic anemia: Secondary | ICD-10-CM

## 2015-04-20 DIAGNOSIS — I63132 Cerebral infarction due to embolism of left carotid artery: Secondary | ICD-10-CM

## 2015-04-20 LAB — HEMOGLOBIN A1C
Hgb A1c MFr Bld: 5.7 % — ABNORMAL HIGH (ref 4.8–5.6)
MEAN PLASMA GLUCOSE: 117 mg/dL

## 2015-04-20 MED ORDER — HYDROCHLOROTHIAZIDE 25 MG PO TABS
12.5000 mg | ORAL_TABLET | Freq: Every day | ORAL | Status: DC
Start: 2015-04-20 — End: 2015-04-21
  Administered 2015-04-20 – 2015-04-21 (×2): 12.5 mg via ORAL
  Filled 2015-04-20 (×2): qty 1

## 2015-04-20 MED ORDER — FAMOTIDINE 20 MG PO TABS
20.0000 mg | ORAL_TABLET | Freq: Two times a day (BID) | ORAL | Status: DC
  Administered 2015-04-20 – 2015-04-21 (×2): 20 mg via ORAL
  Filled 2015-04-20 (×2): qty 1

## 2015-04-20 MED ORDER — LOSARTAN POTASSIUM 50 MG PO TABS
50.0000 mg | ORAL_TABLET | Freq: Every day | ORAL | Status: DC
  Administered 2015-04-20 – 2015-04-21 (×2): 50 mg via ORAL
  Filled 2015-04-20 (×2): qty 1

## 2015-04-20 MED ORDER — HYDRALAZINE HCL 20 MG/ML IJ SOLN: 10.0000 mg | INTRAMUSCULAR | Status: DC | PRN

## 2015-04-20 NOTE — Progress Notes (Signed)
Patient arrived via wheelchair to 5M20 from Neuro ICU 66M at 1420. Patient alert and oriented X 4.no c/o pain. Telemetry applied and CCMD notified. Oriented to room with bed alarm on .

## 2015-04-20 NOTE — Evaluation (Signed)
Clinical/Bedside Swallow Evaluation Patient Details  Name: Craig Holden MRN: [id number] Date of Birth: 1955-09-01  Today's Date: 04/20/2015 Time: SLP Start Time (ACUTE ONLY): [id number] SLP Stop Time (ACUTE ONLY): 0905 SLP Time Calculation (min) (ACUTE ONLY): 12 min  Past Medical History:  Past Medical History  Diagnosis Date  . Hypertension   . CVA (cerebral infarction)   . Nerve damage    Past Surgical History:  Past Surgical History  Procedure Laterality Date  . Back surgery    . Hemorroidectomy    . Hip surgery     HPI:  Patient is a 59yo male with h/o CVA presented to the ED with right arm and right leg weakness accompanied by difficulty speaking/slurred speech. Ct showed acute occlusion of left ICA with left MCA penubra on perfusion study. S/p angiogram in IR, vessel was revascularized and stent placed. ETT for procedure only, less than 24 hours.    Assessment / Plan / Recommendation Clinical Impression  Patient presents with a normal oropharyngeal swallow. No SLP f/u indicated.     Aspiration Risk  No limitations    Diet Recommendation   Regular solids, thin liquids General safe swallowing precautions  Medication Administration: Whole meds with liquid    Other  Recommendations Oral Care Recommendations: Oral care BID   Follow up Recommendations  None           Swallow Study   General HPI: Patient is a 59yo male with h/o CVA presented to the ED with right arm and right leg weakness accompanied by difficulty speaking/slurred speech. Ct showed acute occlusion of left ICA with left MCA penubra on perfusion study. S/p angiogram in IR, vessel was revascularized and stent placed. ETT for procedure only, less than 24 hours.  Type of Study: Bedside Swallow Evaluation Previous Swallow Assessment: none noted Diet Prior to this Study: NPO Temperature Spikes Noted: No Respiratory Status: Room air History of Recent Intubation: Yes Length of Intubations (days):  (for  procedure only, less than 24 hours) Date extubated: 04/19/15 Behavior/Cognition: Alert;Cooperative;Pleasant mood Oral Cavity Assessment: Within Functional Limits Oral Care Completed by SLP: Recent completion by staff Oral Cavity - Dentition: Adequate natural dentition (missing a few teeth) Vision: Functional for self-feeding Self-Feeding Abilities: Able to feed self Patient Positioning: Upright in bed Baseline Vocal Quality: Normal Volitional Cough: Strong Volitional Swallow: Able to elicit    Oral/Motor/Sensory Function Overall Oral Motor/Sensory Function: Within functional limits   Ice Chips Ice chips: Not tested   Thin Liquid Thin Liquid: Within functional limits Presentation: Cup;Self Fed;Straw    Nectar Thick Nectar Thick Liquid: Not tested   Honey Thick Honey Thick Liquid: Not tested   Puree Puree: Within functional limits Presentation: Self Fed;Spoon   Solid Solid: Within functional limits Presentation: Self Fed      Craig Holden, Craig Holden [phone]  Craig Holden 04/20/2015,9:30 AM

## 2015-04-20 NOTE — Progress Notes (Signed)
Rehab Admissions Coordinator Note:  Patient was screened by Trish MageLogue, Brain Honeycutt M for appropriateness for an Inpatient Acute Rehab Consult.  At this time, we are recommending Inpatient Rehab consult.  Trish MageLogue, Haylin Camilli M 04/20/2015, 3:26 PM  I can be reached at [phone].

## 2015-04-20 NOTE — Progress Notes (Signed)
PULMONARY / CRITICAL CARE MEDICINE   Name: Craig Holden MRN: [id number] DOB: 29-May-1956    ADMISSION DATE:  04/18/2015 CONSULTATION DATE:  04/19/15  REFERRING MD :  Hosie PoissonSumner  CHIEF COMPLAINT:  S/p CVA  HISTORY OF PRESENT ILLNESS:   Patient is a 59yo male who presented to the ED with right arm and right leg weakness accompanied by difficulty speaking/slurred speech that began at approximately 17:45 yesterday, 11/19. The information listed is per report as the patient is currently sedated and intubated and unable to provide any information. EMS was called. He was evaluated by neurology for an acute stroke. CT showed acute occlusion of left ICA with L MCA penumbra on the perfusion study. He was taken to IR for a diagnostic angiogram with possible intervention. The vessel was revascularized; stent placed with angioplasty.   SUBJECTIVE:  Extubated 11/21 Feels well this morning, slightly weak  VITAL SIGNS: BP 166/80 mmHg  Pulse 86  Temp(Src) 98.3 F (36.8 C) (Oral)  Resp 17  Ht 6\' 2"  (1.88 m)  Wt 85.5 kg (188 lb 7.9 oz)  BMI 24.19 kg/m2  SpO2 98%  HEMODYNAMICS:    VENTILATOR SETTINGS: Vent Mode:  [-]  PEEP:  [5 cmH20] 5 cmH20 Pressure Support:  [5 cmH20] 5 cmH20  INTAKE / OUTPUT: I/O last 3 completed shifts: In: 3688.6 [I.V.:3588.6; IV Piggyback:100] Out: 2100 [Urine:2100]  PHYSICAL EXAMINATION: General:  59 y/o male, extubated HENT: NCAT OP clear, right carotid endarterectomy scar PULM: CTA B CV: RRR, no mgr GI: BS+, soft, nontender MSK: normal bulk and tone Neuro: slight weakness to grip strength on right, speech clear   LABS:  CBC  Recent Labs Lab 04/18/15 1835 04/18/15 1843 04/19/15 0730  WBC 6.7  --  7.8  HGB 14.9 15.3 12.7*  HCT 42.0 45.0 37.1*  PLT 195  --  186   Coag's  Recent Labs Lab 04/18/15 1835  APTT 27  INR 0.94   BMET  Recent Labs Lab 04/18/15 1835 04/18/15 1843 04/19/15 0730  NA 140 140 137  K 4.0 3.9 3.4*  CL 109 107 107   CO2 23  --  23  BUN 8 7 6   CREATININE 0.99 1.00 1.06  GLUCOSE 95 90 109*   Electrolytes  Recent Labs Lab 04/18/15 1835 04/19/15 0730  CALCIUM 9.4 8.5*   Sepsis Markers No results for input(s): LATICACIDVEN, PROCALCITON, O2SATVEN in the last 168 hours. ABG  Recent Labs Lab 04/19/15 0323 04/19/15 0527  PHART 7.497* 7.390  PCO2ART 28.6* 18.9*  PO2ART 132* 180*   Liver Enzymes  Recent Labs Lab 04/18/15 1835  AST 18  ALT 11*  ALKPHOS 103  BILITOT 0.3  ALBUMIN 3.5   Cardiac Enzymes No results for input(s): TROPONINI, PROBNP in the last 168 hours. Glucose No results for input(s): GLUCAP in the last 168 hours.  Imaging Mr Brain Wo Contrast  04/19/2015  CLINICAL DATA:  59 year old male with RIGHT-sided weakness and facial droop who underwent revascularization of LEFT M1 and superior LEFT M2 emboli resulting from acute LEFT ICA occlusion, with stent assisted angioplasty and thrombolysis. Continued surveillance. EXAM: MRI HEAD WITHOUT CONTRAST MRA HEAD WITHOUT CONTRAST TECHNIQUE: Multiplanar, multiecho pulse sequences of the brain and surrounding structures were obtained without intravenous contrast. Angiographic images of the head were obtained using MRA technique without contrast. COMPARISON:  Multiple prior exams. FINDINGS: MRI HEAD FINDINGS The patient was unable to remain motionless for the exam. Small or subtle lesions could be overlooked. Moderate-sized area of restricted diffusion evolving  LEFT MCA territory including the frontal lobe, particularly operculum, insula, and convexity. Tiny foci of restricted diffusion also seen in the LEFT posterior frontal parasagittal cortex. Linear area of restricted diffusion corresponding to susceptibility on gradient sequence, and prolonged FLAIR signal, consistent with subarachnoid blood in the centrum central sulcus. No lobar hemorrhage or significant hemorrhagic conversion within the infarcted tissue. Mild atrophy. Mild chronic  microvascular ischemic change. Developing cytotoxic edema on T2 and FLAIR imaging. Normal midline structures. Cervical spondylosis. Flow voids are maintained. Layering fluid in the LEFT division sphenoid. Negative orbits. Trace mastoid effusion. Compared with the CT perfusion study, the area of restricted diffusion on today's exam corresponds fairly closely to the area of core infarction predicted on that exam. MRA HEAD FINDINGS The internal carotid arteries are widely patent. 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 Irregularity of the BILATERAL supraclinoid ICAs felt to be related to motion. Misregistration of the RIGHT and LEFT middle cerebral arteries but gross patency is established. Patency of the LEFT M1 specifically is established, as are the LEFT M2 and M3 branches. No proximal ACA lesion. Hypoplastic basilar distal to the anterior inferior cerebellar artery origins reflecting robust posterior communicating arteries/fetal RIGHT PCA. RIGHT vertebral dominant. LEFT vertebral mildly diseased in its distal V4 segment. Poor visualization of the RIGHT temporal arteriovenous malformation due to motion. IMPRESSION: MR brain demonstrating restricted diffusion primarily within the LEFT insula and LEFT frontal operculum, corresponding fairly closely to the area of core infarction predicted on CT perfusion. No significant areas of infarction elsewhere within the LEFT hemisphere, which was at risk (penumbra) for cerebral infarction prior to thrombolysis. Restricted diffusion, to susceptibility and increased FLAIR signal within the LEFT central sulcus representing small volume of subarachnoid hemorrhage. No significant hemorrhagic transformation within the infarction. Motion degraded MRA demonstrates continued patency of the LEFT ICA and LEFT M1 MCA as well as M2 and M3 branches. Electronically Signed   By: Elsie Stain M.D.   On: 04/19/2015 17:58    Dg Chest Port 1 View  04/20/2015  CLINICAL DATA:  Acute respiratory failure . EXAM: PORTABLE CHEST 1 VIEW COMPARISON:  01/13/2015. FINDINGS: Cardiomegaly with mild pulmonary vascular and interstitial prominence. Mild component congestive heart failure cannot be excluded. No focal pulmonary infiltrate. No pleural effusion or pneumothorax . IMPRESSION: Cardiomegaly with mild pulmonary vascular prominence and mild interstitial prominence. Mild component congestive heart failure cannot be excluded . Electronically Signed   By: Maisie Fus  Register   On: 04/20/2015 07:31   Mr Maxine Glenn Head/brain Wo Cm  04/19/2015  CLINICAL DATA:  59 year old male with RIGHT-sided weakness and facial droop who underwent revascularization of LEFT M1 and superior LEFT M2 emboli resulting from acute LEFT ICA occlusion, with stent assisted angioplasty and thrombolysis. Continued surveillance. EXAM: MRI HEAD WITHOUT CONTRAST MRA HEAD WITHOUT CONTRAST TECHNIQUE: Multiplanar, multiecho pulse sequences of the brain and surrounding structures were obtained without intravenous contrast. Angiographic images of the head were obtained using MRA technique without contrast. COMPARISON:  Multiple prior exams. FINDINGS: MRI HEAD FINDINGS The patient was unable to remain motionless for the exam. Small or subtle lesions could be overlooked. Moderate-sized area of restricted diffusion evolving LEFT MCA territory including the frontal lobe, particularly operculum, insula, and convexity. Tiny foci of restricted diffusion also seen in the LEFT posterior frontal parasagittal cortex. Linear area of restricted diffusion corresponding to susceptibility on gradient sequence, and prolonged FLAIR signal, consistent with subarachnoid blood in the centrum central sulcus. No lobar  hemorrhage or significant hemorrhagic conversion within the infarcted tissue. Mild atrophy. Mild chronic microvascular ischemic change. Developing cytotoxic edema on T2 and FLAIR imaging.  Normal midline structures. Cervical spondylosis. Flow voids are maintained. Layering fluid in the LEFT division sphenoid. Negative orbits. Trace mastoid effusion. Compared with the CT perfusion study, the area of restricted diffusion on today's exam corresponds fairly closely to the area of core infarction predicted on that exam. MRA HEAD FINDINGS The internal carotid arteries are widely patent. 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 Irregularity of the BILATERAL supraclinoid ICAs felt to be related to motion. Misregistration of the RIGHT and LEFT middle cerebral arteries but gross patency is established. Patency of the LEFT M1 specifically is established, as are the LEFT M2 and M3 branches. No proximal ACA lesion. Hypoplastic basilar distal to the anterior inferior cerebellar artery origins reflecting robust posterior communicating arteries/fetal RIGHT PCA. RIGHT vertebral dominant. LEFT vertebral mildly diseased in its distal V4 segment. Poor visualization of the RIGHT temporal arteriovenous malformation due to motion. IMPRESSION: MR brain demonstrating restricted diffusion primarily within the LEFT insula and LEFT frontal operculum, corresponding fairly closely to the area of core infarction predicted on CT perfusion. No significant areas of infarction elsewhere within the LEFT hemisphere, which was at risk (penumbra) for cerebral infarction prior to thrombolysis. Restricted diffusion, to susceptibility and increased FLAIR signal within the LEFT central sulcus representing small volume of subarachnoid hemorrhage. No significant hemorrhagic transformation within the infarction. Motion degraded MRA demonstrates continued patency of the LEFT ICA and LEFT M1 MCA as well as M2 and M3 branches. Electronically Signed   By: Elsie Stain M.D.   On: 04/19/2015 17:58    CULTURES: n/a  ANTIBIOTICS: n/a  SIGNIFICANT EVENTS: S/p IR procedure  11/19  LINES/TUBES: ETT 11/20 >> 11/20 Femoral art sheath 11/20 >> 11/20   ASSESSMENT / PLAN:  PULMONARY A: Acute respiratory failure 2/2 CVA > resolved Tobacco abuse P:   Counsel to quit smoking   CARDIOVASCULAR A: Hypertension Chronic diastolic heart failure per echo 07/2009; 11/21 echo with LVH but otherwise normal  P:  BP goals per neurology Remove arterial line  RENAL A:   No active issues P:   Monitor BMET and UOP Replace electrolytes as needed   GASTROINTESTINAL A:   No active issues P:   Advance diet today  HEMATOLOGIC A:   No active issues P:    INFECTIOUS A:   No active issues P:     ENDOCRINE A:   No active issues    P:     NEUROLOGIC A:  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

## 2015-04-20 NOTE — Progress Notes (Signed)
Physical Therapy Evaluation Note  Clinical Impression: Pt presenting with mild impulsivity, bilat LE weakness R worse than L, impaired co-ordination, and requiring increased assist for transfers and ambulation. Pt to strongly benefit from CIR to achieve maximal functional recovery as pt was mod I PTA with RW and cane. Pt does have a wife at home than can provide assist if needed.   04/20/15 1206  PT Visit Information  Last PT Received On 04/20/15  Assistance Needed +1  Reason Eval/Treat Not Completed Patient not medically ready  History of Present Illness Patient is a 59yo male with h/o CVA presented to the ED with right arm and right leg weakness accompanied by difficulty speaking/slurred speech. Ct showed acute occlusion of left ICA with left MCA penubra on perfusion study. S/p angiogram in IR, vessel was revascularized and stent placed  Precautions  Precautions Fall  Restrictions  Weight Bearing Restrictions No  Home Living  Family/patient expects to be discharged to: Private residence  Living Arrangements Spouse/significant other  Available Help at Discharge Family;Available 24 hours/day  Type of Home House  Home Access Stairs to enter  Entrance Stairs-Number of Steps 1  Home Layout Two level  Alternate Level Stairs-Number of Steps flight  Alternate Level Stairs-Rails Can reach both  Bathroom Shower/Tub Walk-in Electronics engineer  Home Equipment BSC;Shower seat - built in;Grab bars - tub/shower;Walker - 2 wheels;Cane - single point;Wheelchair - manual  Lives With Spouse  Prior Function  Level of Independence Independent  Comments uses cane in house and walker outside  Communication  Communication No difficulties  Pain Assessment  Pain Assessment No/denies pain  Cognition  Arousal/Alertness Awake/alert  Behavior During Therapy WFL for tasks assessed/performed  Overall Cognitive Status Impaired/Different from baseline  Area of Impairment  Attention;Safety/judgement  Current Attention Level Sustained  Safety/Judgement Decreased awareness of safety  Upper Extremity Assessment  Upper Extremity Assessment RUE deficits/detail  RUE Deficits / Details decreased FM strength and coordination  RUE Coordination decreased fine motor  Lower Extremity Assessment  Lower Extremity Assessment RLE deficits/detail;LLE deficits/detail  RLE Deficits / Details 3/5 hip flexion  LLE Deficits / Details 3/5 hip flexion  Cervical / Trunk Assessment  Cervical / Trunk Assessment Normal  Bed Mobility  Overal bed mobility Needs Assistance  Bed Mobility Supine to Sit  Supine to sit Supervision  General bed mobility comments hob elevated, pt able to bring LEs off EOB  Transfers  Overall transfer level Needs assistance  Equipment used 1 person hand held assist  Transfers Sit to/from Stand  Sit to Stand Min assist  General transfer comment increased time, trunk flexion, labored effort, unsteady  Ambulation/Gait  Ambulation/Gait assistance Mod assist  Ambulation Distance (Feet) 120 Feet  Assistive device 1 person hand held assist  Gait Pattern/deviations Step-through pattern;Decreased stride length;Staggering left;Staggering right;Trunk flexed  General Gait Details pt unsteady and requires external support, pt with occasional cross over gait pattern due to instability  Gait velocity slow  Modified Rankin (Stroke Patients Only)  Pre-Morbid Rankin Score 3  Modified Rankin 4  Balance  Overall balance assessment Needs assistance  Sitting-balance support No upper extremity supported  Sitting balance-Leahy Scale Good  Standing balance support Single extremity supported  Standing balance-Leahy Scale Poor  Standing balance comment pt with noted reaching for objects to hold onto  PT - End of Session  Equipment Utilized During Treatment Gait belt  Activity Tolerance Patient tolerated treatment well  Patient left in chair;with call bell/phone within  reach  Nurse Communication  Mobility status  PT Assessment  PT Therapy Diagnosis  Difficulty walking;Generalized weakness  PT Recommendation/Assessment Patient needs continued PT services  PT Problem List Decreased strength;Decreased activity tolerance;Decreased balance;Decreased mobility;Decreased coordination  PT Plan  PT Frequency (ACUTE ONLY) Min 4X/week  PT Treatment/Interventions (ACUTE ONLY) DME instruction;Gait training;Stair training;Functional mobility training;Therapeutic activities;Therapeutic exercise;Balance training;Neuromuscular re-education  PT Recommendation  Recommendations for Other Services Rehab consult  Follow Up Recommendations Supervision/Assistance - 24 hour;CIR  PT equipment None recommended by PT  Individuals Consulted  Consulted and Agree with Results and Recommendations Patient  Acute Rehab PT Goals  Patient Stated Goal home  PT Goal Formulation With patient  Time For Goal Achievement 04/27/15  Potential to Achieve Goals Good  PT Time Calculation  PT Start Time (ACUTE ONLY) 1206  PT Stop Time (ACUTE ONLY) 1224  PT Time Calculation (min) (ACUTE ONLY) 18 min  PT General Charges  $$ ACUTE PT VISIT 1 Procedure  PT Evaluation  $Initial PT Evaluation Tier I 1 Procedure  Written Expression  Dominant Hand Right    Lewis ShockAshly Marlaysia Lenig, PT, DPT Pager #: [phone] Office #: [phone]

## 2015-04-20 NOTE — Evaluation (Signed)
Occupational Therapy Evaluation Patient Details Name: Craig Holden MRN: [id number] DOB: [birth year] Today's Date: 04/20/2015    History of Present Illness Patient is a 59yo male with h/o CVA presented to the ED with right arm and right leg weakness accompanied by difficulty speaking/slurred speech. Ct showed acute occlusion of left ICA with left MCA penubra on perfusion study. S/p angiogram in IR, vessel was revascularized and stent placed   Clinical Impression   This 59 yo male admitted with above presents to acute OT with decreased balance, decreased mobility, decreased use of right hand all affecting his PLOF of independent/mod I at home. He will benefit from acute OT with follow up OT on CIR.    Follow Up Recommendations  CIR    Equipment Recommendations  None recommended by OT       Precautions / Restrictions Precautions Precautions: Fall Restrictions Weight Bearing Restrictions: No      Mobility Bed Mobility     General bed mobility comments: up in recliner upon arrival  Transfers Overall transfer level: Needs assistance Equipment used: 1 person hand held assist Transfers: Sit to/from Stand Sit to Stand: Min assist         General transfer comment: increased time, trunk flexion, labored effort, unsteady    Balance Overall balance assessment: Needs assistance Sitting-balance support: No upper extremity supported;Feet supported Sitting balance-Leahy Scale: Good     Standing balance support: Single extremity supported Standing balance-Leahy Scale: Poor Standing balance comment: pt felt the need to hold onto furniture while ambulating in room                            ADL Overall ADL's : Needs assistance/impaired Eating/Feeding: Independent;Sitting   Grooming: Set up;Sitting   Upper Body Bathing: Set up;Sitting   Lower Body Bathing: Set up (min-mod A for balance in standing without UE support)   Upper Body Dressing : Set up;Sitting    Lower Body Dressing: Set up (min-mod A for balance in standing without UE support)   Toilet Transfer: Moderate assistance;Ambulation (recliner>around bed and back to recliner (cross over steps and pt feeling like he needed to hold onto furniture for balance)   Toileting- Clothing Manipulation and Hygiene: Set up (min-mod A for balance in standing without UE support)               Vision Vision Assessment?: Yes Eye Alignment: Within Functional Limits Ocular Range of Motion: Within Functional Limits Alignment/Gaze Preference: Within Defined Limits Tracking/Visual Pursuits: Able to track stimulus in all quads without difficulty Saccades: Within functional limits Convergence: Within functional limits Visual Fields: No apparent deficits Additional Comments: needs new glasses for reading          Pertinent Vitals/Pain Pain Assessment: No/denies pain     Hand Dominance Right   Extremity/Trunk Assessment Upper Extremity Assessment Upper Extremity Assessment: RUE deficits/detail RUE Deficits / Details: decreased FM strength and coordination RUE Coordination: decreased fine motor     Cervical / Trunk Assessment Cervical / Trunk Assessment: Normal   Communication Communication Communication: No difficulties   Cognition Arousal/Alertness: Awake/alert Behavior During Therapy: WFL for tasks assessed/performed Overall Cognitive Status: Impaired/Different from baseline Area of Impairment: Attention;Safety/judgement   Current Attention Level: Sustained     Safety/Judgement: Decreased awareness of safety                    Home Living Family/patient expects to be discharged to:: Private residence Living Arrangements:  Spouse/significant other Available Help at Discharge: Family;Available 24 hours/day Type of Home: House Home Access: Stairs to enter Entergy CorporationEntrance Stairs-Number of Steps: 1   Home Layout: Two level Alternate Level Stairs-Number of Steps: flight Alternate  Level Stairs-Rails: Can reach both Bathroom Shower/Tub: Walk-in Pensions consultantshower;Curtain   Bathroom Toilet: Standard     Home Equipment: Bedside commode;Shower seat - built in;Grab bars - tub/shower      Lives With: Spouse    Prior Functioning/Environment Level of Independence: Independent        Comments: uses cane in house and walker outside    OT Diagnosis: Generalized weakness;Hemiplegia dominant side;Cognitive deficits   OT Problem List: Decreased coordination;Impaired UE functional use;Impaired balance (sitting and/or standing)   OT Treatment/Interventions: Self-care/ADL training;Patient/family education;Balance training;DME and/or AE instruction;Therapeutic activities;Therapeutic exercise    OT Goals(Current goals can be found in the care plan section) Acute Rehab OT Goals Patient Stated Goal: home soon OT Goal Formulation: With patient Time For Goal Achievement: 04/27/15 Potential to Achieve Goals: Good  OT Frequency: Min 3X/week              End of Session    Activity Tolerance: Patient tolerated treatment well Patient left: in chair;with call bell/phone within reach   Time: 1325-1339 OT Time Calculation (min): 14 min Charges:  OT General Charges $OT Visit: 1 Procedure OT Evaluation $Initial OT Evaluation Tier I: 1 Procedure    Evette GeorgesLeonard, Glenda Spelman Eva [phone] 04/20/2015, 2:06 PM

## 2015-04-20 NOTE — Evaluation (Addendum)
Speech Language Pathology Evaluation Patient Details Name: Craig Holden MRN: [id number] DOB: July 26, 1955 Today's Date: 04/20/2015 Time: [id number] SLP Time Calculation (min) (ACUTE ONLY): 22 min  Problem List:  Patient Active Problem List   Diagnosis Date Noted  . CVA (cerebral infarction) 04/18/2015  . Preoperative evaluation to rule out surgical contraindication 01/01/2014  . Essential hypertension 01/01/2014  . History of stroke 01/01/2014   Past Medical History:  Past Medical History  Diagnosis Date  . Hypertension   . CVA (cerebral infarction)   . Nerve damage    Past Surgical History:  Past Surgical History  Procedure Laterality Date  . Back surgery    . Hemorroidectomy    . Hip surgery     HPI:  Patient is a 59yo male with h/o CVA presented to the ED with right arm and right leg weakness accompanied by difficulty speaking/slurred speech. Ct showed acute occlusion of left ICA with left MCA penubra on perfusion study. S/p angiogram in IR, vessel was revascularized and stent placed. ETT for procedure only, less than 24 hours.    Assessment / Plan / Recommendation Clinical Impression  Patient presents with mild-moderate sustained attention impairment s/p acute CVA impacting concentration with moderately complex ADLs and storage of new information. Patient highly distracted by hunger at this time. Will set goals and f/u for diagnostic treatment to determine extent of deficits with decreased internal distractions.     SLP Assessment  Patient needs continued Speech Lanaguage Pathology Services    Follow Up Recommendations  CIR   Frequency and Duration min 2x/week  1 week      SLP Evaluation Prior Functioning  Cognitive/Linguistic Baseline: Within functional limits  Lives With: Spouse   Cognition  Overall Cognitive Status: Impaired/Different from baseline Arousal/Alertness: Awake/alert Orientation Level: Oriented X4 Attention: Sustained Sustained Attention:  Impaired Sustained Attention Impairment: Verbal complex;Functional complex Memory: Impaired Memory Impairment: Storage deficit;Retrieval deficit (due to attention impairment) Awareness: Appears intact Problem Solving: Impaired Problem Solving Impairment: Functional complex (mathmatical problem solving/reasoning ) Behaviors: Impulsive (? mild) Safety/Judgment: Appears intact    Comprehension  Auditory Comprehension Overall Auditory Comprehension: Appears within functional limits for tasks assessed Visual Recognition/Discrimination Discrimination: Within Function Limits Reading Comprehension Reading Status: Within funtional limits    Expression Expression Primary Mode of Expression: Verbal Verbal Expression Overall Verbal Expression: Appears within functional limits for tasks assessed Written Expression Dominant Hand: Right Written Expression: Within Functional Limits   Oral / Motor Oral Motor/Sensory Function Overall Oral Motor/Sensory Function: Within functional limits Motor Speech Overall Motor Speech: Appears within functional limits for tasks assessed   Ferdinand LangoLeah Elane Peabody MA, CCC-SLP [phone]  Craig Holden 04/20/2015, 9:37 AM

## 2015-04-20 NOTE — Progress Notes (Signed)
STROKE TEAM PROGRESS NOTE   SUBJECTIVE (INTERVAL HISTORY) His wife and RN are at the bedside.  He is extubated yesterday, just passed swallow and will be put on diet. Right arm and speech much improved. Admits to smoking cigarettes and marijuana. Plans are to transfer him to the floor today.    OBJECTIVE Temp:  [98.3 F (36.8 C)-98.7 F (37.1 C)] 98.3 F (36.8 C) (11/21 0757) Pulse Rate:  [63-95] 86 (11/21 0800) Cardiac Rhythm:  [-] Normal sinus rhythm (11/21 0800) Resp:  [12-32] 17 (11/21 0800) BP: (97-181)/(60-99) 166/80 mmHg (11/21 0800) SpO2:  [95 %-100 %] 98 % (11/21 0800) Arterial Line BP: (109-199)/(61-108) 177/84 mmHg (11/21 0800)  CBC:   Recent Labs Lab 04/18/15 1835 04/18/15 1843 04/19/15 0730  WBC 6.7  --  7.8  NEUTROABS 2.2  --  5.3  HGB 14.9 15.3 12.7*  HCT 42.0 45.0 37.1*  MCV 92.1  --  92.1  PLT 195  --  186    Basic Metabolic Panel:   Recent Labs Lab 04/18/15 1835 04/18/15 1843 04/19/15 0730  NA 140 140 137  K 4.0 3.9 3.4*  CL 109 107 107  CO2 23  --  23  GLUCOSE 95 90 109*  BUN 8 7 6   CREATININE 0.99 1.00 1.06  CALCIUM 9.4  --  8.5*    Lipid Panel:     Component Value Date/Time   CHOL 166 04/19/2015 0515   TRIG 183* 04/19/2015 0515   TRIG 187* 04/19/2015 0515   HDL 28* 04/19/2015 0515   CHOLHDL 5.9 04/19/2015 0515   VLDL 37 04/19/2015 0515   LDLCALC 101* 04/19/2015 0515   HgbA1c:  Lab Results  Component Value Date   HGBA1C  08/05/2009    5.7 (NOTE) The ADA recommends the following therapeutic goal for glycemic control related to Hgb A1c measurement: Goal of therapy: <6.5 Hgb A1c  Reference: American Diabetes Association: Clinical Practice Recommendations 2010, Diabetes Care, 2010, 33: (Suppl  1).   Urine Drug Screen:     Component Value Date/Time   LABOPIA NONE DETECTED 04/18/2015 2113   COCAINSCRNUR NONE DETECTED 04/18/2015 2113   LABBENZ NONE DETECTED 04/18/2015 2113   AMPHETMU NONE DETECTED 04/18/2015 2113   THCU  POSITIVE* 04/18/2015 2113   LABBARB NONE DETECTED 04/18/2015 2113      IMAGING I have personally reviewed the radiological images below and agree with the radiology interpretations.  Ct Angio Head and Neck  W/cm &/or Wo Cm 04/18/2015   IMPRESSION:  CT PERFUSION: Compensated LEFT middle cerebral artery territory ischemia (penumbra).  CTA NECK:  Occluded LEFT internal carotid artery at the origin, which appears acute. Atherosclerosis without hemodynamically significant stenosis  RIGHT ICA. 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 Multilevel severe neural foraminal narrowing and moderate to severe canal stenosis.  CTA HEAD: Retrograde flow into distal LEFT internal carotid artery. Moderate stenosis LEFT M1. 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 Complete circle of Willis.  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   Ct Head Wo Contrast 04/19/2015   IMPRESSION:  Patchy areas of suspected acute ischemia in LEFT insula/MCA territory on this CT head with contrast, status post angiogram.   04/18/2015   IMPRESSION:  Negative noncontrast head CT.   2D echo  - Left ventricle: The cavity size was normal. Wall thickness wasincreased in a pattern of moderate LVH. Systolic function  wasnormal. The estimated ejection fraction was in the range of 60%to 65%. Wall motion was normal; there were no regional wallmotion abnormalities. Doppler parameters are consistent withabnormal left ventricular relaxation (grade 1 diastolicdysfunction). - Aortic valve: There was no stenosis. - Mitral valve: Mildly calcified annulus. Mildly calcified leaflets. There was no significant regurgitation. - Right ventricle: The cavity size was normal. Systolic functionwas normal. - Pulmonary arteries: No complete TR doppler jet so unable toestimate PA systolic  pressure. - Systemic veins: IVC measured 2.0 cm with < 50% respirophasicvariation, estimated RA pressure 8 mmHg. Impressions:  Normal LV size with EF 60-65%. Moderate LV hypertrophy. Normal RVsize and systolic function. No significant valvularabnormalities.  MRI brain and MRA head  04/19/2015   MR brain demonstrating restricted diffusion primarily within the LEFT insula and LEFT frontal operculum, corresponding fairly closely to the area of core infarction predicted on CT perfusion. No significant areas of infarction elsewhere within the LEFT hemisphere, which was at risk (penumbra) for cerebral infarction prior to thrombolysis. Restricted diffusion, to susceptibility and increased FLAIR signal within the LEFT central sulcus representing small volume of subarachnoid hemorrhage. No significant hemorrhagic transformation within the infarction. Motion degraded MRA demonstrates continued patency of the LEFT ICA and LEFT M1 MCA as well as M2 and M3 branches.    Dg Chest Port 1 View 04/20/2015  Cardiomegaly with mild pulmonary vascular prominence and mild interstitial prominence. Mild component congestive heart failure cannot be excluded .     PHYSICAL EXAM  Temp:  [98.3 F (36.8 C)-98.7 F (37.1 C)] 98.4 F (36.9 C) (11/21 1120) Pulse Rate:  [63-95] 80 (11/21 1100) Resp:  [12-32] 23 (11/21 1100) BP: (97-181)/(60-99) 158/95 mmHg (11/21 1100) SpO2:  [95 %-100 %] 98 % (11/21 1100) Arterial Line BP: (109-199)/(61-108) 177/84 mmHg (11/21 0800)  General - Well nourished, well developed, in no apparent distress.  Ophthalmologic - Fundi not visualized due to eye movement.  Cardiovascular - Regular rate and rhythm.  Mental Status -  Level of arousal and orientation to time, place, and person were intact. Language including expression, naming, repetition, comprehension was assessed and found intact. Fund of Knowledge was assessed and was intact.  Cranial Nerves II - XII - II - Visual field  intact OU. III, IV, VI - Extraocular movements intact. V - Facial sensation intact bilaterally. VII - Facial movement showed subtle right facial asymmetry. VIII - Hearing & vestibular intact bilaterally. X - Palate elevates symmetrically. XI - Chin turning & shoulder shrug intact bilaterally. XII - Tongue protrusion intact.  Motor Strength - The patient's strength was normal in all extremities and pronator drift was absent.  Bulk was normal and fasciculations were absent.   Motor Tone - Muscle tone was assessed at the neck and appendages and was normal.  Reflexes - The patient's reflexes were 1+ in all extremities and he had no pathological reflexes.  Sensory - Light touch, temperature/pinprick were assessed and were symmetrical.    Coordination - The patient had normal movements in the hands and feet with no ataxia or dysmetria.  Tremor was absent.  Gait and Station - deferred to PT in room.    ASSESSMENT/PLAN Craig Holden is a 59 y.o. male with history of tobacco use, substance abuse, hypertension, previous CVA, and nerve damage presenting with slurred speech, mild facial droop and mild right-sided weakness.  He did not receive IV t-PA due to minimal deficits.   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 Continue dual antiplatelet due to ICA stent.   Patient counseled to be compliant  with his antithrombotic medications  Ongoing aggressive stroke risk factor management  Therapy recommendations:  pending   Disposition: Pending. Anticipate ok for discharge tomorrow  Hypertension  gradually normalize in 5-7 days  Resume home medications today  Hyperlipidemia  Home meds: No lipid lowering medications prior to admission  LDL 101, goal < 70  Now on Lipitor 80 mg daily  Continue statin at discharge  Tobacco abuse  Current smoker  Smoking cessation counseling will be provided  Marijuana abuse  Current user  Cessation counseling provided  Pt is willing to quit  Other Stroke Risk Factors  Advanced age  Hx stroke/TIA - 2011 involving right side weakness but no residue  Other Active Problems  Transfer to floor  Hospital day # 2   Marvel Plan, MD PhD Stroke Neurology 04/20/2015 9:40 AM      To contact Stroke Continuity provider, please refer to [email or web address]. After hours, contact General Neurology

## 2015-04-20 NOTE — Progress Notes (Signed)
Patient ID: Craig Holden, male   DOB: [birth year], 59 y.o.   MRN: [id number]    Referring Physician(s): Stroke team  Chief Complaint:  CVA L MCA CVA; L ICA occlusion   Subjective:  L MCA revascularization and L ICA stent placement 11/19 in IR With Dr Corliss Skains Up in bed Feeling well Pleasant and chipper today Has no complaints  Allergies: Morphine sulfate  Medications: Prior to Admission medications   Medication Sig Start Date End Date Taking? Authorizing Provider  albuterol (PROVENTIL HFA;VENTOLIN HFA) 108 (90 BASE) MCG/ACT inhaler Inhale 1-2 puffs into the lungs every 6 (six) hours as needed for wheezing. 06/12/11  Yes April Palumbo, MD  aspirin EC 81 MG tablet Take 81 mg by mouth daily.     Yes Historical Provider, MD  gabapentin (NEURONTIN) 800 MG tablet Take 800 mg by mouth 3 (three) times daily.   Yes Historical Provider, MD  hydrochlorothiazide (HYDRODIURIL) 25 MG tablet Take 12.5 mg by mouth daily.    Yes Historical Provider, MD  losartan (COZAAR) 100 MG tablet Take 50 mg by mouth daily.   Yes Historical Provider, MD  nicotine (RA NICOTINE) 21 mg/24hr patch Place 1 patch onto the skin daily.   Yes Historical Provider, MD  sildenafil (VIAGRA) 100 MG tablet Take 100 mg by mouth daily as needed for erectile dysfunction.   Yes Historical Provider, MD     Vital Signs: BP 166/80 mmHg  Pulse 86  Temp(Src) 98.3 F (36.8 C) (Oral)  Resp 17  Ht 6\' 2"  (1.88 m)  Wt 188 lb 7.9 oz (85.5 kg)  BMI 24.19 kg/m2  SpO2 98%  Physical Exam  Constitutional: He is oriented to person, place, and time.  Pulmonary/Chest: Effort normal.  Abdominal: Soft. Bowel sounds are normal.  Musculoskeletal: Normal range of motion.  Rt arm slightly weaker  Neurological: He is alert and oriented to person, place, and time.  Skin: Skin is dry.  Rt groin: NT no bleeding No hematoma  Rt foot 2+ pulses  Psychiatric: He has a normal mood and affect. His behavior is normal. Judgment and thought  content normal.  Nursing note and vitals reviewed.   Imaging: Ct Angio Head W/cm &/or Wo Cm  04/18/2015  CLINICAL DATA:  Slurred speech and questionable facial droop, RIGHT-sided weakness beginning at 5:45 p.m. today. 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 EXAM: CT PERFUSION HEAD WITH CONTRAST CT ANGIOGRAPHY OF THE HEAD AND NECK TECHNIQUE: Multidetector CT imaging of the head and neck was performed using the standard protocol during bolus administration of intravenous contrast. Multiplanar CT image reconstructions and MIPs were obtained to evaluate the vascular anatomy. Carotid stenosis measurements (when applicable) are obtained utilizing NASCET criteria, using the distal internal carotid diameter as the denominator. CT for fusion with post processed off line imaging. CONTRAST:  OMNIPAQUE IOHEXOL 350 MG/ML SOLN COMPARISON:  CT head April 18, 2015 at 1836 hours FINDINGS: CT PERFUSION Increased mean transit time LEFT frontal lobe and anterior LEFT parietal lobe, with maintained cerebral blood volume compatible with number/ischemia in the LEFT middle cerebral artery territory. CTA NECK Normal appearance of the thoracic arch, normal branch pattern. The origins of the innominate, left Common carotid artery and subclavian artery are widely patent. Eccentric calcific atherosclerosis of the LEFT subclavian artery origin. Bilateral Common carotid arteries are widely patent, coursing in a straight line fashion. Eccentric calcific atherosclerosis in intimal thickening of  the RIGHT carotid bulb, without hemodynamically significant stenosis. Expanded low-density thrombus fluid within the LEFT internal carotid artery from the bifurcation cephalad, completely excluded. RIGHT vertebral artery is dominant. Normal appearance of the vertebral arteries, which appear widely patent. Extrinsic deformity due to  degenerative cervical spine. No dissection, no pseudoaneurysm. No abnormal luminal irregularity. No contrast extravasation. Soft tissues are unremarkable. Straightened cervical lordosis, status post C4-5, C5-6 and C6-7 ACDF, non incorporated fusion material. Uncovertebral hypertrophy resulting severe C5-6 and C6-7 neural foraminal narrowing. Congenital canal stenosis with multilevel moderate neural canal to severe canal stenosis. CTA HEAD Anterior circulation: Widely patent RIGHT cervical internal carotid arteries, petrous, cavernous and supra clinoid internal carotid arteries. 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 The LEFT internal carotid artery is occluded to the level of the cavernous carotid were there is thready, presumably retrograde flow. Widely patent anterior communicating artery. Normal appearance of the anterior cerebral arteries. Moderate stenosis LEFT M1 segment. Occluded LEFT M2 segment. Occluded LEFT M 3 segment. Posterior circulation: Normal appearance of the vertebral arteries, vertebrobasilar junction and basilar artery, as well as main branch vessels. Robust bilateral posterior communicating arteries present. Normal appearance of the posterior cerebral arteries. 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 Drainage to the RIGHT vein of Labbe better characterized on prior catheter angiogram, narrowing of the distal cortical vein, equivocal for stenosis. IMPRESSION: CT PERFUSION: Compensated LEFT middle cerebral artery territory ischemia (penumbra). CTA NECK: Occluded LEFT internal carotid artery at the origin, which appears acute. Atherosclerosis without hemodynamically significant stenosis RIGHT ICA. 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 Multilevel severe neural foraminal narrowing and  moderate to severe canal stenosis. CTA HEAD: Retrograde flow into distal LEFT internal carotid artery. Moderate stenosis LEFT M1. 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 Acute findings discussed with and reconfirmed by Dr.Sumner, neurology on 04/18/2015 at 9:15pm. Electronically Signed   By: Awilda Metro M.D.   On: 04/18/2015 21:21   Ct Head Wo Contrast  04/19/2015  CLINICAL DATA:  Follow-up stroke. Status post revascularization of proximal LEFT internal carotid artery (stent assisted angioplasty) and MCA after embolism. EXAM: CT HEAD WITHOUT CONTRAST TECHNIQUE: Contiguous axial images were obtained from the base of the skull through the vertex without intravenous contrast. COMPARISON:  CT head April 18, 2015 FINDINGS: The ventricles and sulci are normal. No intraparenchymal hemorrhage, mass effect nor midline shift. Subtle areas of LEFT insular gray-white matter blurring. No abnormal intracranial enhancement or contrast staining. No abnormal extra-axial fluid collections. Basal cisterns are patent. Moderate calcific atherosclerosis of the carotid siphons. No skull fracture. The included ocular globes and orbital contents are non-suspicious. Moderate paranasal sinus mucosal thickening without air-fluid levels. The mastoid air cells are well aerated. Fullness of the nasopharyngeal soft tissues with life-support lines in place. IMPRESSION: Patchy areas of suspected acute ischemia in LEFT insula/MCA territory on this CT head with contrast, status post angiogram. Electronically Signed   By: Awilda Metro M.D.   On: 04/19/2015 03:06   Ct Head Wo Contrast  04/18/2015  CLINICAL DATA:  Acute onset right-sided weakness, slurred speech acute onset right-sided weakness, slurred speech, facial droop, and  confusion. EXAM: CT HEAD WITHOUT CONTRAST TECHNIQUE: Contiguous axial images  were obtained from the base of the skull through the vertex without intravenous contrast. COMPARISON:  04/11/2013 FINDINGS: Brain: No evidence of acute infarction, hemorrhage, extra-axial collection, ventriculomegaly, or mass effect. Vascular: No hyperdense vessel or unexpected calcification. Skull: Negative for fracture or focal lesion. Sinuses/Orbits: No acute findings. Other: None. IMPRESSION: Negative noncontrast head CT. These results were called by telephone at the time of interpretation on 04/18/2015 at 6:54 pm to Dr. Gasper Lloyd, who verbally acknowledged these results. Electronically Signed   By: Myles Rosenthal M.D.   On: 04/18/2015 18:56   Ct Angio Neck W/cm &/or Wo/cm  04/18/2015  CLINICAL DATA:  Slurred speech and questionable facial droop, RIGHT-sided weakness beginning at 5:45 p.m. today. 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 EXAM: CT PERFUSION HEAD WITH CONTRAST CT ANGIOGRAPHY OF THE HEAD AND NECK TECHNIQUE: Multidetector CT imaging of the head and neck was performed using the standard protocol during bolus administration of intravenous contrast. Multiplanar CT image reconstructions and MIPs were obtained to evaluate the vascular anatomy. Carotid stenosis measurements (when applicable) are obtained utilizing NASCET criteria, using the distal internal carotid diameter as the denominator. CT for fusion with post processed off line imaging. CONTRAST:  OMNIPAQUE IOHEXOL 350 MG/ML SOLN COMPARISON:  CT head April 18, 2015 at 1836 hours FINDINGS: CT PERFUSION Increased mean transit time LEFT frontal lobe and anterior LEFT parietal lobe, with maintained cerebral blood volume compatible with number/ischemia in the LEFT middle cerebral artery territory. CTA NECK Normal appearance of the thoracic arch, normal branch  pattern. The origins of the innominate, left Common carotid artery and subclavian artery are widely patent. Eccentric calcific atherosclerosis of the LEFT subclavian artery origin. Bilateral Common carotid arteries are widely patent, coursing in a straight line fashion. Eccentric calcific atherosclerosis in intimal thickening of the RIGHT carotid bulb, without hemodynamically significant stenosis. Expanded low-density thrombus fluid within the LEFT internal carotid artery from the bifurcation cephalad, completely excluded. RIGHT vertebral artery is dominant. Normal appearance of the vertebral arteries, which appear widely patent. Extrinsic deformity due to degenerative cervical spine. No dissection, no pseudoaneurysm. No abnormal luminal irregularity. No contrast extravasation. Soft tissues are unremarkable. Straightened cervical lordosis, status post C4-5, C5-6 and C6-7 ACDF, non incorporated fusion material. Uncovertebral hypertrophy resulting severe C5-6 and C6-7 neural foraminal narrowing. Congenital canal stenosis with multilevel moderate neural canal to severe canal stenosis. CTA HEAD Anterior circulation: Widely patent RIGHT cervical internal carotid arteries, petrous, cavernous and supra clinoid internal carotid arteries. 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 The LEFT internal carotid artery is occluded to the level of the cavernous carotid were there is thready, presumably retrograde flow. Widely patent anterior communicating artery. Normal appearance of the anterior cerebral arteries. Moderate stenosis LEFT M1 segment. Occluded LEFT M2 segment. Occluded LEFT M 3 segment. Posterior circulation: Normal appearance of the vertebral arteries, vertebrobasilar junction and basilar artery, as well as main branch vessels. Robust bilateral posterior communicating arteries present. Normal appearance of the posterior cerebral arteries. RIGHT inferior temporal lobe arterial  venous malformation, from with enlarged contribution from RIGHT middle meningeal artery, multiple prominent vessels with 5 mm aneurysmal contribution. Drainage to the RIGHT vein of Labbe better characterized on prior catheter angiogram, narrowing of the distal cortical vein, equivocal for stenosis. IMPRESSION: CT PERFUSION: Compensated LEFT middle cerebral artery territory ischemia (penumbra). CTA NECK: Occluded LEFT internal carotid artery at the origin, which  appears acute. Atherosclerosis without hemodynamically significant stenosis RIGHT ICA. 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 Multilevel severe neural foraminal narrowing and moderate to severe canal stenosis. CTA HEAD: Retrograde flow into distal LEFT internal carotid artery. Moderate stenosis LEFT M1. 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 Acute findings discussed with and reconfirmed by Dr.Sumner, neurology on 04/18/2015 at 9:15pm. Electronically Signed   By: Awilda Metro M.D.   On: 04/18/2015 21:21   Mr Brain Wo Contrast  04/19/2015  CLINICAL DATA:  59 year old male with RIGHT-sided weakness and facial droop who underwent revascularization of LEFT M1 and superior LEFT M2 emboli resulting from acute LEFT ICA occlusion, with stent assisted angioplasty and thrombolysis. Continued surveillance. EXAM: MRI HEAD WITHOUT CONTRAST MRA HEAD WITHOUT CONTRAST TECHNIQUE: Multiplanar, multiecho pulse sequences of the brain and surrounding structures were obtained without intravenous contrast. Angiographic images of the head were obtained using MRA technique without contrast. COMPARISON:  Multiple prior exams. FINDINGS: MRI HEAD FINDINGS The patient was unable to remain motionless for the exam. Small or subtle lesions could be overlooked.  Moderate-sized area of restricted diffusion evolving LEFT MCA territory including the frontal lobe, particularly operculum, insula, and convexity. Tiny foci of restricted diffusion also seen in the LEFT posterior frontal parasagittal cortex. Linear area of restricted diffusion corresponding to susceptibility on gradient sequence, and prolonged FLAIR signal, consistent with subarachnoid blood in the centrum central sulcus. No lobar hemorrhage or significant hemorrhagic conversion within the infarcted tissue. Mild atrophy. Mild chronic microvascular ischemic change. Developing cytotoxic edema on T2 and FLAIR imaging. Normal midline structures. Cervical spondylosis. Flow voids are maintained. Layering fluid in the LEFT division sphenoid. Negative orbits. Trace mastoid effusion. Compared with the CT perfusion study, the area of restricted diffusion on today's exam corresponds fairly closely to the area of core infarction predicted on that exam. MRA HEAD FINDINGS The internal carotid arteries are widely patent. 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 Irregularity of the BILATERAL supraclinoid ICAs felt to be related to motion. Misregistration of the RIGHT and LEFT middle cerebral arteries but gross patency is established. Patency of the LEFT M1 specifically is established, as are the LEFT M2 and M3 branches. No proximal ACA lesion. Hypoplastic basilar distal to the anterior inferior cerebellar artery origins reflecting robust posterior communicating arteries/fetal RIGHT PCA. RIGHT vertebral dominant. LEFT vertebral mildly diseased in its distal V4 segment. Poor visualization of the RIGHT temporal arteriovenous malformation due to motion. IMPRESSION: MR brain demonstrating restricted diffusion primarily within the LEFT insula and LEFT frontal operculum, corresponding fairly closely to the area of core infarction predicted on CT perfusion. No  significant areas of infarction elsewhere within the LEFT hemisphere, which was at risk (penumbra) for cerebral infarction prior to thrombolysis. Restricted diffusion, to susceptibility and increased FLAIR signal within the LEFT central sulcus representing small volume of subarachnoid hemorrhage. No significant hemorrhagic transformation within the infarction. Motion degraded MRA demonstrates continued patency of the LEFT ICA and LEFT M1 MCA as well as M2 and M3 branches. Electronically Signed   By: Elsie Stain M.D.   On: 04/19/2015 17:58   Ct Cerebral Perfusion W/cm  04/18/2015  CLINICAL DATA:  Slurred speech and questionable facial droop, RIGHT-sided weakness beginning at 5:45 p.m. today. 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 History of hypertension and nerve damage. EXAM: CT PERFUSION HEAD WITH CONTRAST CT ANGIOGRAPHY OF THE HEAD AND NECK TECHNIQUE: Multidetector CT imaging of the head and neck was performed using the standard protocol during bolus administration of intravenous contrast. Multiplanar CT image reconstructions and MIPs were obtained to evaluate the vascular anatomy. Carotid stenosis measurements (when applicable) are obtained utilizing NASCET criteria, using the distal internal carotid diameter as the denominator. CT for fusion with post processed off line imaging. CONTRAST:  OMNIPAQUE IOHEXOL 350 MG/ML SOLN COMPARISON:  CT head April 18, 2015 at 1836 hours FINDINGS: CT PERFUSION Increased mean transit time LEFT frontal lobe and anterior LEFT parietal lobe, with maintained cerebral blood volume compatible with number/ischemia in the LEFT middle cerebral artery territory. CTA NECK Normal appearance of the thoracic arch, normal branch pattern. The origins of the innominate, left Common carotid artery and subclavian artery are widely patent. Eccentric calcific atherosclerosis of the LEFT subclavian artery origin.  Bilateral Common carotid arteries are widely patent, coursing in a straight line fashion. Eccentric calcific atherosclerosis in intimal thickening of the RIGHT carotid bulb, without hemodynamically significant stenosis. Expanded low-density thrombus fluid within the LEFT internal carotid artery from the bifurcation cephalad, completely excluded. RIGHT vertebral artery is dominant. Normal appearance of the vertebral arteries, which appear widely patent. Extrinsic deformity due to degenerative cervical spine. No dissection, no pseudoaneurysm. No abnormal luminal irregularity. No contrast extravasation. Soft tissues are unremarkable. Straightened cervical lordosis, status post C4-5, C5-6 and C6-7 ACDF, non incorporated fusion material. Uncovertebral hypertrophy resulting severe C5-6 and C6-7 neural foraminal narrowing. Congenital canal stenosis with multilevel moderate neural canal to severe canal stenosis. CTA HEAD Anterior circulation: Widely patent RIGHT cervical internal carotid arteries, petrous, cavernous and supra clinoid internal carotid arteries. 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 The LEFT internal carotid artery is occluded to the level of the cavernous carotid were there is thready, presumably retrograde flow. Widely patent anterior communicating artery. Normal appearance of the anterior cerebral arteries. Moderate stenosis LEFT M1 segment. Occluded LEFT M2 segment. Occluded LEFT M 3 segment. Posterior circulation: Normal appearance of the vertebral arteries, vertebrobasilar junction and basilar artery, as well as main branch vessels. Robust bilateral posterior communicating arteries present. Normal appearance of the posterior cerebral arteries. 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 Drainage to the RIGHT vein of  Labbe better characterized on prior catheter angiogram, narrowing of the distal cortical vein, equivocal for stenosis. IMPRESSION: CT PERFUSION: Compensated LEFT middle cerebral artery territory ischemia (penumbra). CTA NECK: Occluded LEFT internal carotid artery at the origin, which appears acute. Atherosclerosis without hemodynamically significant stenosis RIGHT ICA. 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 Multilevel severe neural foraminal narrowing and moderate to severe canal stenosis. CTA HEAD: Retrograde flow into distal LEFT internal carotid artery. Moderate stenosis LEFT M1. 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 Acute findings discussed with and reconfirmed by Dr.Sumner, neurology on 04/18/2015 at 9:15pm. Electronically Signed   By: Awilda Metro M.D.   On: 04/18/2015 21:21   Dg Chest Port 1 View  04/20/2015  CLINICAL DATA:  Acute respiratory failure . EXAM: PORTABLE CHEST  1 VIEW COMPARISON:  01/13/2015. FINDINGS: Cardiomegaly with mild pulmonary vascular and interstitial prominence. Mild component congestive heart failure cannot be excluded. No focal pulmonary infiltrate. No pleural effusion or pneumothorax . IMPRESSION: Cardiomegaly with mild pulmonary vascular prominence and mild interstitial prominence. Mild component congestive heart failure cannot be excluded . Electronically Signed   By: Maisie Fushomas  Register   On: 04/20/2015 07:31   Mr Maxine GlennMra Head/brain Wo Cm  04/19/2015  CLINICAL DATA:  59 year old male with RIGHT-sided weakness and facial droop who underwent revascularization of LEFT M1 and superior LEFT M2 emboli resulting from acute LEFT ICA occlusion, with stent assisted angioplasty and thrombolysis. Continued surveillance. EXAM: MRI HEAD WITHOUT CONTRAST MRA HEAD  WITHOUT CONTRAST TECHNIQUE: Multiplanar, multiecho pulse sequences of the brain and surrounding structures were obtained without intravenous contrast. Angiographic images of the head were obtained using MRA technique without contrast. COMPARISON:  Multiple prior exams. FINDINGS: MRI HEAD FINDINGS The patient was unable to remain motionless for the exam. Small or subtle lesions could be overlooked. Moderate-sized area of restricted diffusion evolving LEFT MCA territory including the frontal lobe, particularly operculum, insula, and convexity. Tiny foci of restricted diffusion also seen in the LEFT posterior frontal parasagittal cortex. Linear area of restricted diffusion corresponding to susceptibility on gradient sequence, and prolonged FLAIR signal, consistent with subarachnoid blood in the centrum central sulcus. No lobar hemorrhage or significant hemorrhagic conversion within the infarcted tissue. Mild atrophy. Mild chronic microvascular ischemic change. Developing cytotoxic edema on T2 and FLAIR imaging. Normal midline structures. Cervical spondylosis. Flow voids are maintained. Layering fluid in the LEFT division sphenoid. Negative orbits. Trace mastoid effusion. Compared with the CT perfusion study, the area of restricted diffusion on today's exam corresponds fairly closely to the area of core infarction predicted on that exam. MRA HEAD FINDINGS The internal carotid arteries are widely patent. 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 Irregularity of the BILATERAL supraclinoid ICAs felt to be related to motion. Misregistration of the RIGHT and LEFT middle cerebral arteries but gross patency is established. Patency of the LEFT M1 specifically is established, as are the LEFT M2 and M3 branches. No proximal ACA lesion. Hypoplastic basilar distal to the anterior inferior cerebellar artery origins reflecting robust posterior communicating  arteries/fetal RIGHT PCA. RIGHT vertebral dominant. LEFT vertebral mildly diseased in its distal V4 segment. Poor visualization of the RIGHT temporal arteriovenous malformation due to motion. IMPRESSION: MR brain demonstrating restricted diffusion primarily within the LEFT insula and LEFT frontal operculum, corresponding fairly closely to the area of core infarction predicted on CT perfusion. No significant areas of infarction elsewhere within the LEFT hemisphere, which was at risk (penumbra) for cerebral infarction prior to thrombolysis. Restricted diffusion, to susceptibility and increased FLAIR signal within the LEFT central sulcus representing small volume of subarachnoid hemorrhage. No significant hemorrhagic transformation within the infarction. Motion degraded MRA demonstrates continued patency of the LEFT ICA and LEFT M1 MCA as well as M2 and M3 branches. Electronically Signed   By: Elsie StainJohn T Curnes M.D.   On: 04/19/2015 17:58    Labs:  CBC:  Recent Labs  04/18/15 1835 04/18/15 1843 04/19/15 0730  WBC 6.7  --  7.8  HGB 14.9 15.3 12.7*  HCT 42.0 45.0 37.1*  PLT 195  --  186    COAGS:  Recent Labs  04/18/15 1835  INR 0.94  APTT 27    BMP:  Recent Labs  04/18/15 1835 04/18/15 1843 04/19/15 0730  NA  140 140 137  K 4.0 3.9 3.4*  CL 109 107 107  CO2 23  --  23  GLUCOSE 95 90 109*  BUN CALCIUM 9.4  --  8.5*  CREATININE 0.99 1.00 1.06  GFRNONAA >60  --  >60  GFRAA >60  --  >60    LIVER FUNCTION TESTS:  Recent Labs  04/18/15 1835  BILITOT 0.3  AST 18  ALT 11*  ALKPHOS 103  PROT 6.8  ALBUMIN 3.5    Assessment and Plan:  CVA Post L MCA revascularization and L ICA stent placement 11/19 Doing well Plan per Stroke Team  Signed: Darika Ildefonso A 04/20/2015, 10:09 AM   I spent a total of 15 Minutes at the the patient's bedside AND on the patient's hospital floor or unit, greater than 50% of which was counseling/coordinating care for CVA; L MCA revasc  and L ICAstent

## 2015-04-20 NOTE — Consult Note (Signed)
Physical Medicine and Rehabilitation Consult   Reason for Consult: Right sided weakness and slurred speech.  Referring Physician:  Dr. Xu   HPI: Craig Holden is a Roda Shutters59 y.o. male with history of HTN, chronic LBP, ACDF 01/20/15, CVA was admitted on 04/18/15 with right sided weakness, right facial droop and slurred speech. 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   He was treated with tPA and underwent cerebral angio with revascularization of prox MCA and superior division of L-MCA thrombectomy and stent with complete revascularization by Dr. Corliss Skains. He was extubated on 11/20 and placed on regular diet as passed swallow evaluation.  2D echo done revealing EF 60-65% and moderate LVH. Follow up MRI/MRA brain done revealing restricted diffusion within left insula and left frontal operculum and increased flair signal within left central sulcus representing small volume of SAH, patency of L-ICA and L- MCA branches. 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 PT/OT evaluations done and patient noted to be impulsive, distracted due to mild to moderate attention deficits,  BLE weakness and ataxia affecting safety with mobility and self care tasks. CIR recommended for follow up therapy.   Review of Systems  Constitutional: Negative for fever and chills.  Eyes: Negative for blurred vision.  Gastrointestinal: Positive for constipation.  Musculoskeletal: Positive for myalgias.  Neurological: Positive for focal weakness and weakness. Negative for headaches.  All other systems reviewed and are negative.   Past Medical History  Diagnosis Date  . Hypertension   . CVA (cerebral infarction)   . Nerve damage     Past Surgical History  Procedure Laterality Date  . Back surgery    . Hemorroidectomy    . Hip surgery    . Radiology with  anesthesia N/A 04/18/2015    Procedure: RADIOLOGY WITH ANESTHESIA;  Surgeon: Julieanne Cotton, MD;  Location: [facility] OR;  Service: Radiology;  Laterality: N/A;    Family History  Problem Relation Age of Onset  . Hypertension Mother   . Diabetes Mother   . Heart attack Father   . Emphysema Father     Social History:  Married. Independent PTA. He lives in a 2 story home with facilities only on the 2nd floor with his wife, who is available 24/7 on discharge. He reports that he has been smoking Cigarettes.  He has been smoking about 1.00 pack per day. He does not have any smokeless tobacco history on file. He reports that he uses illicit drugs (Marijuana). He reports that he does not drink alcohol.   Allergies  Allergen Reactions  . Morphine Sulfate Nausea And Vomiting    Medications Prior to Admission  Medication Sig Dispense Refill  . albuterol (PROVENTIL HFA;VENTOLIN HFA) 108 (90 BASE) MCG/ACT inhaler Inhale 1-2 puffs into the lungs every 6 (six) hours as needed for wheezing. 1 Inhaler 0  . aspirin EC 81 MG tablet Take 81 mg by mouth daily.      Marland Kitchen gabapentin (NEURONTIN) 800 MG tablet Take 800 mg by mouth 3 (three) times daily.    . hydrochlorothiazide (HYDRODIURIL) 25 MG tablet Take 12.5 mg by mouth daily.     Marland Kitchen losartan (COZAAR) 100 MG tablet Take 50 mg by mouth daily.    . nicotine (RA NICOTINE) 21 mg/24hr patch Place 1 patch onto the skin daily.    . sildenafil (VIAGRA) 100  MG tablet Take 100 mg by mouth daily as needed for erectile dysfunction.      Home: Home Living Family/patient expects to be discharged to:: Private residence Living Arrangements: Spouse/significant other Available Help at Discharge: Family, Available 24 hours/day Type of Home: House Home Access: Stairs to enter [employer] of Steps: 1 Home Layout: Two level Alternate Level Stairs-Number of Steps: flight Alternate Level Stairs-Rails: Can reach both Bathroom Shower/Tub: Psychologist, counselling,  Engineer, building services: Standard Home Equipment: Bedside commode, Shower seat - built in, [employer] - tub/shower  Lives With: Spouse  Functional History: Prior Function Level of Independence: Independent Comments: uses cane in house and walker outside Functional Status:  Mobility: Bed Mobility Overal bed mobility: Needs Assistance Bed Mobility: Supine to Sit Supine to sit: Supervision General bed mobility comments: up in recliner upon arrival Transfers Overall transfer level: Needs assistance Equipment used: 1 person hand held assist Transfers: Sit to/from Stand Sit to Stand: Min assist General transfer comment: increased time, trunk flexion, labored effort, unsteady Ambulation/Gait Ambulation/Gait assistance: Mod assist Ambulation Distance (Feet): 120 Feet Assistive device: 1 person hand held assist Gait Pattern/deviations: Step-through pattern, Decreased stride length, Staggering left, Staggering right, Trunk flexed General Gait Details: pt unsteady and requires external support, pt with occasional cross over gait pattern due to instability Gait velocity: slow    ADL: ADL Overall ADL's : Needs assistance/impaired Eating/Feeding: Independent, Sitting Grooming: Set up, Sitting Upper Body Bathing: Set up, Sitting Lower Body Bathing: Set up (min-mod A for balance in standing without UE support) Upper Body Dressing : Set up, Sitting Lower Body Dressing: Set up (min-mod A for balance in standing without UE support) Toilet Transfer: Moderate assistance, Ambulation (recliner>around bed and back to recliner (cross over steps and pt feeling like he needed to hold onto furniture for balance) Toileting- Clothing Manipulation and Hygiene: Set up (min-mod A for balance in standing without UE support)  Cognition: Cognition Overall Cognitive Status: Impaired/Different from baseline Arousal/Alertness: Awake/alert Orientation Level: Oriented X4 Attention: Sustained Sustained  Attention: Impaired Sustained Attention Impairment: Verbal complex, Functional complex Memory: Impaired Memory Impairment: Storage deficit, Retrieval deficit (due to attention impairment) Awareness: Appears intact Problem Solving: Impaired Problem Solving Impairment: Functional complex (mathmatical problem solving/reasoning ) Behaviors: Impulsive (? mild) Safety/Judgment: Appears intact Cognition Arousal/Alertness: Awake/alert Behavior During Therapy: WFL for tasks assessed/performed Overall Cognitive Status: Impaired/Different from baseline Area of Impairment: Attention, Safety/judgement Current Attention Level: Sustained Safety/Judgement: Decreased awareness of safety  Blood pressure 155/78, pulse 76, temperature 98.4 F (36.9 C), temperature source Oral, resp. rate 18, height 6\' 2"  (1.88 m), weight 85.5 kg (188 lb 7.9 oz), SpO2 98 %. Physical Exam  Vitals reviewed. Constitutional: He is oriented to person, place, and time. He appears well-developed and well-nourished. No distress.  HENT:  Head: Normocephalic and atraumatic.  Eyes: Conjunctivae and EOM are normal.  Neck: Normal range of motion. Neck supple.  Cardiovascular: Normal rate and regular rhythm.   Respiratory: Effort normal and breath sounds normal. He has no wheezes.  GI: Soft. Bowel sounds are normal. There is no tenderness.  Musculoskeletal: He exhibits no edema or tenderness.  PROM WNL  Neurological: He is alert and oriented to person, place, and time. He displays normal reflexes.  Sensation intact to light touch Motor:  B/l UE: 4+/5 proximal to distal RLE 4-/5 proximal to distal LLE: 4/5 proximal to distal No dysmetria  Skin: Skin is warm and dry.  Psychiatric: He has a normal mood and affect. His behavior is normal.  No results found for this or any previous visit (from the past 24 hour(s)). Ct Angio Head W/cm &/or Wo Cm  04/18/2015  CLINICAL DATA:  Slurred speech and questionable facial droop,  RIGHT-sided weakness beginning at 5:45 p.m. today. 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 EXAM: CT PERFUSION HEAD WITH CONTRAST CT ANGIOGRAPHY OF THE HEAD AND NECK TECHNIQUE: Multidetector CT imaging of the head and neck was performed using the standard protocol during bolus administration of intravenous contrast. Multiplanar CT image reconstructions and MIPs were obtained to evaluate the vascular anatomy. Carotid stenosis measurements (when applicable) are obtained utilizing NASCET criteria, using the distal internal carotid diameter as the denominator. CT for fusion with post processed off line imaging. CONTRAST:  OMNIPAQUE IOHEXOL 350 MG/ML SOLN COMPARISON:  CT head April 18, 2015 at 1836 hours FINDINGS: CT PERFUSION Increased mean transit time LEFT frontal lobe and anterior LEFT parietal lobe, with maintained cerebral blood volume compatible with number/ischemia in the LEFT middle cerebral artery territory. CTA NECK Normal appearance of the thoracic arch, normal branch pattern. The origins of the innominate, left Common carotid artery and subclavian artery are widely patent. Eccentric calcific atherosclerosis of the LEFT subclavian artery origin. Bilateral Common carotid arteries are widely patent, coursing in a straight line fashion. Eccentric calcific atherosclerosis in intimal thickening of the RIGHT carotid bulb, without hemodynamically significant stenosis. Expanded low-density thrombus fluid within the LEFT internal carotid artery from the bifurcation cephalad, completely excluded. RIGHT vertebral artery is dominant. Normal appearance of the vertebral arteries, which appear widely patent. Extrinsic deformity due to degenerative cervical spine. No dissection, no pseudoaneurysm. No abnormal luminal irregularity. No contrast extravasation. Soft tissues are unremarkable. Straightened cervical  lordosis, status post C4-5, C5-6 and C6-7 ACDF, non incorporated fusion material. Uncovertebral hypertrophy resulting severe C5-6 and C6-7 neural foraminal narrowing. Congenital canal stenosis with multilevel moderate neural canal to severe canal stenosis. CTA HEAD Anterior circulation: Widely patent RIGHT cervical internal carotid arteries, petrous, cavernous and supra clinoid internal carotid arteries. 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 The LEFT internal carotid artery is occluded to the level of the cavernous carotid were there is thready, presumably retrograde flow. Widely patent anterior communicating artery. Normal appearance of the anterior cerebral arteries. Moderate stenosis LEFT M1 segment. Occluded LEFT M2 segment. Occluded LEFT M 3 segment. Posterior circulation: Normal appearance of the vertebral arteries, vertebrobasilar junction and basilar artery, as well as main branch vessels. Robust bilateral posterior communicating arteries present. Normal appearance of the posterior cerebral arteries. 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 Drainage to the RIGHT vein of Labbe better characterized on prior catheter angiogram, narrowing of the distal cortical vein, equivocal for stenosis. IMPRESSION: CT PERFUSION: Compensated LEFT middle cerebral artery territory ischemia (penumbra). CTA NECK: Occluded LEFT internal carotid artery at the origin, which appears acute. Atherosclerosis without hemodynamically significant stenosis RIGHT ICA. 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 Multilevel severe neural foraminal narrowing and moderate to severe canal stenosis. CTA HEAD: Retrograde flow into distal LEFT internal carotid artery. Moderate stenosis LEFT M1. Occluded LEFT M2 and LEFT and M3 segments  compatible with thromboembolic disease. 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  Acute findings discussed with and reconfirmed by Dr.Sumner, neurology on 04/18/2015 at 9:15pm. Electronically Signed   By: Awilda Metro M.D.   On: 04/18/2015 21:21   Ct Head Wo Contrast  04/19/2015  CLINICAL DATA:  Follow-up stroke. Status post revascularization of proximal LEFT internal carotid artery (stent assisted angioplasty) and MCA after embolism. EXAM: CT HEAD WITHOUT CONTRAST TECHNIQUE: Contiguous axial images were obtained from the base of the skull through the vertex without intravenous contrast. COMPARISON:  CT head April 18, 2015 FINDINGS: The ventricles and sulci are normal. No intraparenchymal hemorrhage, mass effect nor midline shift. Subtle areas of LEFT insular gray-white matter blurring. No abnormal intracranial enhancement or contrast staining. No abnormal extra-axial fluid collections. Basal cisterns are patent. Moderate calcific atherosclerosis of the carotid siphons. No skull fracture. The included ocular globes and orbital contents are non-suspicious. Moderate paranasal sinus mucosal thickening without air-fluid levels. The mastoid air cells are well aerated. Fullness of the nasopharyngeal soft tissues with life-support lines in place. IMPRESSION: Patchy areas of suspected acute ischemia in LEFT insula/MCA territory on this CT head with contrast, status post angiogram. Electronically Signed   By: Awilda Metro M.D.   On: 04/19/2015 03:06   Ct Head Wo Contrast  04/18/2015  CLINICAL DATA:  Acute onset right-sided weakness, slurred speech acute onset right-sided weakness, slurred speech, facial droop, and confusion. EXAM: CT HEAD WITHOUT CONTRAST TECHNIQUE: Contiguous axial images were obtained from the base of the skull through the vertex without intravenous contrast.  COMPARISON:  04/11/2013 FINDINGS: Brain: No evidence of acute infarction, hemorrhage, extra-axial collection, ventriculomegaly, or mass effect. Vascular: No hyperdense vessel or unexpected calcification. Skull: Negative for fracture or focal lesion. Sinuses/Orbits: No acute findings. Other: None. IMPRESSION: Negative noncontrast head CT. These results were called by telephone at the time of interpretation on 04/18/2015 at 6:54 pm to Dr. Gasper Lloyd, who verbally acknowledged these results. Electronically Signed   By: Myles Rosenthal M.D.   On: 04/18/2015 18:56   Ct Angio Neck W/cm &/or Wo/cm  04/18/2015  CLINICAL DATA:  Slurred speech and questionable facial droop, RIGHT-sided weakness beginning at 5:45 p.m. today. 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 EXAM: CT PERFUSION HEAD WITH CONTRAST CT ANGIOGRAPHY OF THE HEAD AND NECK TECHNIQUE: Multidetector CT imaging of the head and neck was performed using the standard protocol during bolus administration of intravenous contrast. Multiplanar CT image reconstructions and MIPs were obtained to evaluate the vascular anatomy. Carotid stenosis measurements (when applicable) are obtained utilizing NASCET criteria, using the distal internal carotid diameter as the denominator. CT for fusion with post processed off line imaging. CONTRAST:  OMNIPAQUE IOHEXOL 350 MG/ML SOLN COMPARISON:  CT head April 18, 2015 at 1836 hours FINDINGS: CT PERFUSION Increased mean transit time LEFT frontal lobe and anterior LEFT parietal lobe, with maintained cerebral blood volume compatible with number/ischemia in the LEFT middle cerebral artery territory. CTA NECK Normal appearance of the thoracic arch, normal branch pattern. The origins of the innominate, left Common carotid artery and subclavian artery are widely patent. Eccentric calcific atherosclerosis of the LEFT subclavian artery  origin. Bilateral Common carotid arteries are widely patent, coursing in a straight line fashion. Eccentric calcific atherosclerosis in intimal thickening of the RIGHT carotid bulb, without hemodynamically significant stenosis. Expanded low-density thrombus fluid within the LEFT internal carotid artery from the bifurcation cephalad, completely excluded. RIGHT vertebral artery is dominant. Normal appearance of the  vertebral arteries, which appear widely patent. Extrinsic deformity due to degenerative cervical spine. No dissection, no pseudoaneurysm. No abnormal luminal irregularity. No contrast extravasation. Soft tissues are unremarkable. Straightened cervical lordosis, status post C4-5, C5-6 and C6-7 ACDF, non incorporated fusion material. Uncovertebral hypertrophy resulting severe C5-6 and C6-7 neural foraminal narrowing. Congenital canal stenosis with multilevel moderate neural canal to severe canal stenosis. CTA HEAD Anterior circulation: Widely patent RIGHT cervical internal carotid arteries, petrous, cavernous and supra clinoid internal carotid arteries. 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 The LEFT internal carotid artery is occluded to the level of the cavernous carotid were there is thready, presumably retrograde flow. Widely patent anterior communicating artery. Normal appearance of the anterior cerebral arteries. Moderate stenosis LEFT M1 segment. Occluded LEFT M2 segment. Occluded LEFT M 3 segment. Posterior circulation: Normal appearance of the vertebral arteries, vertebrobasilar junction and basilar artery, as well as main branch vessels. Robust bilateral posterior communicating arteries present. Normal appearance of the posterior cerebral arteries. 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 Drainage to the RIGHT  vein of Labbe better characterized on prior catheter angiogram, narrowing of the distal cortical vein, equivocal for stenosis. IMPRESSION: CT PERFUSION: Compensated LEFT middle cerebral artery territory ischemia (penumbra). CTA NECK: Occluded LEFT internal carotid artery at the origin, which appears acute. Atherosclerosis without hemodynamically significant stenosis RIGHT ICA. 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 Multilevel severe neural foraminal narrowing and moderate to severe canal stenosis. CTA HEAD: Retrograde flow into distal LEFT internal carotid artery. Moderate stenosis LEFT M1. 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 Acute findings discussed with and reconfirmed by Dr.Sumner, neurology on 04/18/2015 at 9:15pm. Electronically Signed   By: Awilda Metroourtnay  Bloomer M.D.   On: 04/18/2015 21:21   Mr Brain Wo Contrast  04/19/2015  CLINICAL DATA:  59 year old male with RIGHT-sided weakness and facial droop who underwent revascularization of LEFT M1 and superior LEFT M2 emboli resulting from acute LEFT ICA occlusion, with stent assisted angioplasty and thrombolysis. Continued surveillance. EXAM: MRI HEAD WITHOUT CONTRAST MRA HEAD WITHOUT CONTRAST TECHNIQUE: Multiplanar, multiecho pulse sequences of the brain and surrounding structures were obtained without intravenous contrast. Angiographic images of the head were obtained using MRA technique without contrast. COMPARISON:  Multiple prior exams. FINDINGS: MRI HEAD FINDINGS The patient was unable to remain motionless for the exam. Small or subtle lesions could be overlooked. Moderate-sized area of restricted diffusion evolving LEFT MCA territory including the frontal lobe, particularly operculum, insula, and convexity. Tiny foci of restricted  diffusion also seen in the LEFT posterior frontal parasagittal cortex. Linear area of restricted diffusion corresponding to susceptibility on gradient sequence, and prolonged FLAIR signal, consistent with subarachnoid blood in the centrum central sulcus. No lobar hemorrhage or significant hemorrhagic conversion within the infarcted tissue. Mild atrophy. Mild chronic microvascular ischemic change. Developing cytotoxic edema on T2 and FLAIR imaging. Normal midline structures. Cervical spondylosis. Flow voids are maintained. Layering fluid in the LEFT division sphenoid. Negative orbits. Trace mastoid effusion. Compared with the CT perfusion study, the area of restricted diffusion on today's exam corresponds fairly closely to the area of core infarction predicted on that exam. MRA HEAD FINDINGS The internal carotid arteries are widely patent. The  RIGHT internal carotid artery is larger, reflecting the arteriovenous malformation in the RIGHT temporal lobe. Irregularity of the BILATERAL supraclinoid ICAs felt to be related to motion. Misregistration of the RIGHT and LEFT middle cerebral arteries but gross patency is established. Patency of the LEFT M1 specifically is established, as are the LEFT M2 and M3 branches. No proximal ACA lesion. Hypoplastic basilar distal to the anterior inferior cerebellar artery origins reflecting robust posterior communicating arteries/fetal RIGHT PCA. RIGHT vertebral dominant. LEFT vertebral mildly diseased in its distal V4 segment. Poor visualization of the RIGHT temporal arteriovenous malformation due to motion. IMPRESSION: MR brain demonstrating restricted diffusion primarily within the LEFT insula and LEFT frontal operculum, corresponding fairly closely to the area of core infarction predicted on CT perfusion. No significant areas of infarction elsewhere within the LEFT hemisphere, which was at risk (penumbra) for cerebral infarction prior to thrombolysis. Restricted diffusion, to  susceptibility and increased FLAIR signal within the LEFT central sulcus representing small volume of subarachnoid hemorrhage. No significant hemorrhagic transformation within the infarction. Motion degraded MRA demonstrates continued patency of the LEFT ICA and LEFT M1 MCA as well as M2 and M3 branches. Electronically Signed   By: Elsie Stain M.D.   On: 04/19/2015 17:58   Ct Cerebral Perfusion W/cm  04/18/2015  CLINICAL DATA:  Slurred speech and questionable facial droop, RIGHT-sided weakness beginning at 5:45 p.m. today. 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 EXAM: CT PERFUSION HEAD WITH CONTRAST CT ANGIOGRAPHY OF THE HEAD AND NECK TECHNIQUE: Multidetector CT imaging of the head and neck was performed using the standard protocol during bolus administration of intravenous contrast. Multiplanar CT image reconstructions and MIPs were obtained to evaluate the vascular anatomy. Carotid stenosis measurements (when applicable) are obtained utilizing NASCET criteria, using the distal internal carotid diameter as the denominator. CT for fusion with post processed off line imaging. CONTRAST:  OMNIPAQUE IOHEXOL 350 MG/ML SOLN COMPARISON:  CT head April 18, 2015 at 1836 hours FINDINGS: CT PERFUSION Increased mean transit time LEFT frontal lobe and anterior LEFT parietal lobe, with maintained cerebral blood volume compatible with number/ischemia in the LEFT middle cerebral artery territory. CTA NECK Normal appearance of the thoracic arch, normal branch pattern. The origins of the innominate, left Common carotid artery and subclavian artery are widely patent. Eccentric calcific atherosclerosis of the LEFT subclavian artery origin. Bilateral Common carotid arteries are widely patent, coursing in a straight line fashion. Eccentric calcific atherosclerosis in intimal thickening of the RIGHT carotid bulb,  without hemodynamically significant stenosis. Expanded low-density thrombus fluid within the LEFT internal carotid artery from the bifurcation cephalad, completely excluded. RIGHT vertebral artery is dominant. Normal appearance of the vertebral arteries, which appear widely patent. Extrinsic deformity due to degenerative cervical spine. No dissection, no pseudoaneurysm. No abnormal luminal irregularity. No contrast extravasation. Soft tissues are unremarkable. Straightened cervical lordosis, status post C4-5, C5-6 and C6-7 ACDF, non incorporated fusion material. Uncovertebral hypertrophy resulting severe C5-6 and C6-7 neural foraminal narrowing. Congenital canal stenosis with multilevel moderate neural canal to severe canal stenosis. CTA HEAD Anterior circulation: Widely patent RIGHT cervical internal carotid arteries, petrous, cavernous and supra clinoid internal carotid arteries. 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 The LEFT internal carotid artery is occluded to the level of the cavernous carotid were there is thready, presumably retrograde flow. Widely patent anterior communicating artery. Normal appearance of the anterior cerebral  arteries. Moderate stenosis LEFT M1 segment. Occluded LEFT M2 segment. Occluded LEFT M 3 segment. Posterior circulation: Normal appearance of the vertebral arteries, vertebrobasilar junction and basilar artery, as well as main branch vessels. Robust bilateral posterior communicating arteries present. Normal appearance of the posterior cerebral arteries. 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 Drainage to the RIGHT vein of [place] better characterized on prior catheter angiogram, narrowing of the distal cortical vein, equivocal for stenosis. IMPRESSION: CT PERFUSION: Compensated LEFT middle  cerebral artery territory ischemia (penumbra). CTA NECK: Occluded LEFT internal carotid artery at the origin, which appears acute. Atherosclerosis without hemodynamically significant stenosis RIGHT ICA. 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 Multilevel severe neural foraminal narrowing and moderate to severe canal stenosis. CTA HEAD: Retrograde flow into distal LEFT internal carotid artery. Moderate stenosis LEFT M1. 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 Acute findings discussed with and reconfirmed by Dr.Sumner, neurology on 04/18/2015 at 9:15pm. Electronically Signed   By: Awilda Metro M.D.   On: 04/18/2015 21:21   Dg Chest Port 1 View  04/20/2015  CLINICAL DATA:  Acute respiratory failure . EXAM: PORTABLE CHEST 1 VIEW COMPARISON:  01/13/2015. FINDINGS: Cardiomegaly with mild pulmonary vascular and interstitial prominence. Mild component congestive heart failure cannot be excluded. No focal pulmonary infiltrate. No pleural effusion or pneumothorax . IMPRESSION: Cardiomegaly with mild pulmonary vascular prominence and mild interstitial prominence. Mild component congestive heart failure cannot be excluded . Electronically Signed   By: Maisie Fus  Register   On: 04/20/2015 07:31   Mr Maxine Glenn Head/brain Wo Cm  04/19/2015  CLINICAL DATA:  60 year old male with RIGHT-sided weakness and facial droop who underwent revascularization of LEFT M1 and superior LEFT M2 emboli resulting from acute LEFT ICA occlusion, with stent assisted angioplasty and thrombolysis. Continued surveillance. EXAM: MRI HEAD WITHOUT CONTRAST MRA HEAD WITHOUT CONTRAST TECHNIQUE: Multiplanar, multiecho pulse sequences of the brain and surrounding structures were obtained without intravenous contrast. Angiographic images  of the head were obtained using MRA technique without contrast. COMPARISON:  Multiple prior exams. FINDINGS: MRI HEAD FINDINGS The patient was unable to remain motionless for the exam. Small or subtle lesions could be overlooked. Moderate-sized area of restricted diffusion evolving LEFT MCA territory including the frontal lobe, particularly operculum, insula, and convexity. Tiny foci of restricted diffusion also seen in the LEFT posterior frontal parasagittal cortex. Linear area of restricted diffusion corresponding to susceptibility on gradient sequence, and prolonged FLAIR signal, consistent with subarachnoid blood in the centrum central sulcus. No lobar hemorrhage or significant hemorrhagic conversion within the infarcted tissue. Mild atrophy. Mild chronic microvascular ischemic change. Developing cytotoxic edema on T2 and FLAIR imaging. Normal midline structures. Cervical spondylosis. Flow voids are maintained. Layering fluid in the LEFT division sphenoid. Negative orbits. Trace mastoid effusion. Compared with the CT perfusion study, the area of restricted diffusion on today's exam corresponds fairly closely to the area of core infarction predicted on that exam. MRA HEAD FINDINGS The internal carotid arteries are widely patent. 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 Irregularity of the BILATERAL supraclinoid ICAs felt to be related to motion. Misregistration of the RIGHT and LEFT middle cerebral arteries but gross patency is established.  Patency of the LEFT M1 specifically is established, as are the LEFT M2 and M3 branches. No proximal ACA lesion. Hypoplastic basilar distal to the anterior inferior cerebellar artery origins reflecting robust posterior communicating arteries/fetal RIGHT PCA. RIGHT vertebral dominant. LEFT vertebral mildly diseased in its distal V4 segment. Poor visualization of the RIGHT temporal arteriovenous  malformation due to motion. IMPRESSION: MR brain demonstrating restricted diffusion primarily within the LEFT insula and LEFT frontal operculum, corresponding fairly closely to the area of core infarction predicted on CT perfusion. No significant areas of infarction elsewhere within the LEFT hemisphere, which was at risk (penumbra) for cerebral infarction prior to thrombolysis. Restricted diffusion, to susceptibility and increased FLAIR signal within the LEFT central sulcus representing small volume of subarachnoid hemorrhage. No significant hemorrhagic transformation within the infarction. Motion degraded MRA demonstrates continued patency of the LEFT ICA and LEFT M1 MCA as well as M2 and M3 branches. Electronically Signed   By: Elsie Stain M.D.   On: 04/19/2015 17:58    Assessment/Plan: Diagnosis: Left MCA infarct Labs and images independently reviewed.  Records reviewed and summated above. Stroke: Continue secondary stroke prophylaxis and Risk Factor Modification listed below:   Antiplatelet therapy Blood Pressure Management:  Continue current medication with prn's with permisive HTN per primary team Statin Agent Tobacco abuse:  Cont couseling Right sided hemiparesis: fit for orthotics to prevent contractures (resting hand splint for day, wrist cock up splint at night, PRAFO, etc) PT/OT for mobility, ADL training  Motor recovery: Fluoxetine  1. Does the need for close, 24 hr/day medical supervision in concert with the patient's rehab needs make it unreasonable for this patient to be served in a less intensive setting? Yes  2. Co-Morbidities requiring supervision/potential complications: ABLA (transfuse if necessary to ensure appropriate perfusion for increased activity tolerance), HTN (monitor and provide prns in accordance with increased physical exertion and pain), chronic LBP (Biofeedback training with therapies to help reduce reliance on opiate pain medications, Monitor pain control during  therapies, and sedation at rest and titrate to maximum efficacy to ensure participation and gains in therapies), ACDF 01/20/15, diastolic CHF, tobacco abuse (cont counseling) 3. Due to bladder management, bowel management, safety, disease management, medication administration and patient education, does the patient require 24 hr/day rehab nursing? Yes 4. Does the patient require coordinated care of a physician, rehab nurse, PT (1.5-2 hrs/day, 5 days/week) and OT (1.5-2 hrs/day, 5 days/week) to address physical and functional deficits in the context of the above medical diagnosis(es)? Yes Addressing deficits in the following areas: balance, endurance, locomotion, strength, transferring, dressing, toileting and psychosocial support 5. Can the patient actively participate in an intensive therapy program of at least 3 hrs of therapy per day at least 5 days per week? Yes 6. The potential for patient to make measurable gains while on inpatient rehab is good 7. Anticipated functional outcomes upon discharge from inpatient rehab are modified independent and supervision  with PT, modified independent with OT, n/a with SLP. 8. Estimated rehab length of stay to reach the above functional goals is: 8-12 days. 9. Does the patient have adequate social supports and living environment to accommodate these discharge functional goals? Yes 10. Anticipated D/C setting: Home 11. Anticipated post D/C treatments: HH therapy and Home excercise program 12. Overall Rehab/Functional Prognosis: good  RECOMMENDATIONS: This patient's condition is appropriate for continued rehabilitative care in the following setting: CIR Patient has agreed to participate in recommended program. Yes Note that insurance prior authorization may be required for reimbursement for  recommended care.  Comment: Rehab Admissions Coordinator to follow up.   Maryla Morrow, MD 04/20/2015

## 2015-04-21 DIAGNOSIS — Z8673 Personal history of transient ischemic attack (TIA), and cerebral infarction without residual deficits: Secondary | ICD-10-CM

## 2015-04-21 DIAGNOSIS — I6522 Occlusion and stenosis of left carotid artery: Secondary | ICD-10-CM

## 2015-04-21 MED ORDER — ASPIRIN 325 MG PO TABS: 325.0000 mg | ORAL_TABLET | Freq: Every day | ORAL | Status: DC

## 2015-04-21 MED ORDER — ATORVASTATIN CALCIUM 80 MG PO TABS: 80.0000 mg | ORAL_TABLET | Freq: Every day | ORAL | Status: DC

## 2015-04-21 MED ORDER — CLOPIDOGREL BISULFATE 75 MG PO TABS: 75.0000 mg | ORAL_TABLET | Freq: Every day | ORAL | Status: DC

## 2015-04-21 NOTE — Progress Notes (Signed)
Physical Therapy Treatment Patient Details Name: Craig Holden MRN: [id number] DOB: April 06, 1956 Today's Date: 04/21/2015    History of Present Illness Patient is a 59yo male with h/o CVA presented to the ED with right arm and right leg weakness accompanied by difficulty speaking/slurred speech. Ct showed acute occlusion of left ICA with left MCA penubra on perfusion study. S/p angiogram in IR, vessel was revascularized and stent placed    PT Comments    Wife present and states pt is at his baseline for walking (he reports abnormal gait PTA). He walks with RW (normally prefers cane), bent over, with weakness in his hips causing some lateral movement. He states he has never had PT to address these deficits and is interested in OPPT. We discussed decreasing fall risk and decreasing "wear and tear" on his spine and LE joints.    Follow Up Recommendations  Outpatient PT (for strengthening and balance training )     Equipment Recommendations  None recommended by PT    Recommendations for Other Services       Precautions / Restrictions Precautions Precautions: Fall Restrictions Weight Bearing Restrictions: No    Mobility  Bed Mobility                  Transfers Overall transfer level: Modified independent Equipment used: Rolling walker (2 wheeled) Transfers: Sit to/from Stand Sit to Stand: Modified independent (Device/Increase time)         General transfer comment: remains in flexed position (hips and trunk) and both pt/wife reports this is normal for him  Ambulation/Gait Ambulation/Gait assistance: Supervision;Modified independent (Device/Increase time) Ambulation Distance (Feet): 80 Feet Assistive device: Rolling walker (2 wheeled) Gait Pattern/deviations: Step-through pattern;Decreased stride length;Decreased weight shift to right;Trunk flexed Gait velocity: slow   General Gait Details: pt and wife report his gait is at his baseline   Stairs Stairs:  Yes Stairs assistance: Min guard Stair Management: Two rails;Step to pattern;Forwards Number of Stairs: 6 General stair comments: no difficulty on stairs  Wheelchair Mobility    Modified Rankin (Stroke Patients Only) Modified Rankin (Stroke Patients Only) Pre-Morbid Rankin Score: Moderate disability Modified Rankin: Moderate disability     Balance Overall balance assessment: Needs assistance   Sitting balance-Leahy Scale: Good Sitting balance - Comments: >good not tested     Standing balance-Leahy Scale: Poor                      Cognition Arousal/Alertness: Awake/alert Behavior During Therapy: WFL for tasks assessed/performed Overall Cognitive Status: Within Functional Limits for tasks assessed                 General Comments: wife present and felt pt at his baseline; no impulsivity seen    Exercises      General Comments        Pertinent Vitals/Pain Pain Assessment: No/denies pain    Home Living                      Prior Function            PT Goals (current goals can now be found in the care plan section) Acute Rehab PT Goals Patient Stated Goal: home Time For Goal Achievement: 04/27/15 Progress towards PT goals: Progressing toward goals    Frequency  Min 4X/week    PT Plan Discharge plan needs to be updated    Co-evaluation             End  of Session Equipment Utilized During Treatment: Gait belt Activity Tolerance: Patient tolerated treatment well Patient left: in chair;with call bell/phone within reach;with family/visitor present     Time: [id number] PT Time Calculation (min) (ACUTE ONLY): 11 min  Charges:  $Gait Training: 8-22 mins                    G Codes:      Monasia Lair 04/21/2015, 12:30 PM Pager [phone]

## 2015-04-21 NOTE — Progress Notes (Signed)
Pt is discharged to home via wheelchair. IV d/c'd and telemtry d/c'd,

## 2015-04-21 NOTE — Progress Notes (Signed)
Speech Language Pathology Treatment: Cognitive-Linquistic  Patient Details Name: Craig Holden MRN: [id number] DOB: [birth year] Today's Date: 04/21/2015 Time: [id number] SLP Time Calculation (min) (ACUTE ONLY): 14 min  Assessment / Plan / Recommendation Clinical Impression  Pt persists with deficits in sustained and selective attention, requiring moderate verbal cues for success at tasks.  Moderate deficits in short-term recall also interfere with independence.  Pt with difficulty recalling visitors/staff and education they have provided re: follow-up.  Given pt's responsibilities re: managing family's finances, recommend SLP f/u at OP to address current deficits.     HPI HPI: Patient is a 59yo male with h/o CVA presented to the ED with right arm and right leg weakness accompanied by difficulty speaking/slurred speech. Ct showed acute occlusion of left ICA with left MCA penubra on perfusion study. S/p angiogram in IR, vessel was revascularized and stent placed. ETT for procedure only, less than 24 hours.       SLP Plan  OP SLP   Recommendations   OP SLP                  Blenda MountsCouture, Craig Holden 04/21/2015, 2:25 PM

## 2015-04-21 NOTE — Progress Notes (Signed)
CCMD called to report 7 runs of Vtach.  Notified bedside RN.  She stated patient was up and going to the bathroom at the time and was in no distress.

## 2015-04-21 NOTE — Discharge Summary (Signed)
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 Hypertension   . CVA (cerebral infarction)   . Nerve damage   . Depression   . PTSD (post-traumatic stress disorder)    Past Surgical History  Procedure Laterality Date  . Back surgery    . Hemorroidectomy    . Hip surgery    . Radiology with anesthesia N/A 04/18/2015    Procedure: RADIOLOGY WITH ANESTHESIA;  Surgeon: Julieanne Cotton, MD;  Location: [facility] OR;  Service: Radiology;  Laterality: N/A;  . Anterior cervical decompression/discectomy fusion 4 levels  01/20/2015    C4-C7      Medication List    STOP taking these medications        aspirin EC 81 MG tablet  Replaced by:  aspirin 325 MG tablet      TAKE these medications        albuterol 108 (90 BASE) MCG/ACT inhaler  Commonly known as:  PROVENTIL HFA;VENTOLIN HFA  Inhale 1-2 puffs into the lungs every 6 (six) hours as needed for wheezing.     aspirin 325 MG tablet  Take 1 tablet (325 mg total) by mouth daily with breakfast.     atorvastatin 80 MG tablet  Commonly known as:  LIPITOR  Take 1 tablet (80 mg total) by mouth daily at 6 PM.     clopidogrel 75 MG tablet  Commonly known as:  PLAVIX  Take 1 tablet (75 mg total) by mouth daily with breakfast.     gabapentin 800 MG tablet  Commonly known  as:  NEURONTIN  Take 800 mg by mouth 3 (three) times daily.     hydrochlorothiazide 25 MG tablet  Commonly known as:  HYDRODIURIL  Take 12.5 mg by mouth daily.     losartan 100 MG tablet  Commonly known as:  COZAAR  Take 50 mg by mouth daily.     RA NICOTINE 21 mg/24hr patch  Generic drug:  nicotine  Place 1 patch onto the skin daily.     sildenafil 100 MG tablet  Commonly known as:  VIAGRA  Take 100 mg by mouth daily as needed for erectile dysfunction.        LABORATORY STUDIES CBC    Component Value Date/Time   WBC 7.8 04/19/2015 0730   RBC 4.03* 04/19/2015 0730   HGB 12.7* 04/19/2015 0730   HCT 37.1* 04/19/2015 0730   PLT 186 04/19/2015 0730   MCV 92.1 04/19/2015 0730   MCH 31.5 04/19/2015 0730   MCHC 34.2 04/19/2015 0730   RDW 14.7 04/19/2015 0730   LYMPHSABS 1.8 04/19/2015 0730   MONOABS 0.7 04/19/2015 0730   EOSABS 0.0 04/19/2015 0730   BASOSABS 0.0 04/19/2015 0730   CMP    Component Value Date/Time   NA 137 04/19/2015 0730  K 3.4* 04/19/2015 0730   CL 107 04/19/2015 0730   CO2 23 04/19/2015 0730   GLUCOSE 109* 04/19/2015 0730   BUN 6 04/19/2015 0730   CREATININE 1.06 04/19/2015 0730   CALCIUM 8.5* 04/19/2015 0730   PROT 6.8 04/18/2015 1835   ALBUMIN 3.5 04/18/2015 1835   AST 18 04/18/2015 1835   ALT 11* 04/18/2015 1835   ALKPHOS 103 04/18/2015 1835   BILITOT 0.3 04/18/2015 1835   GFRNONAA >60 04/19/2015 0730   GFRAA >60 04/19/2015 0730   COAGS Lab Results  Component Value Date   INR 0.94 04/18/2015   INR 0.92 08/11/2009   INR 0.94 08/05/2009   Lipid Panel    Component Value Date/Time   CHOL 166 04/19/2015 0515   TRIG 183* 04/19/2015 0515   TRIG 187* 04/19/2015 0515   HDL 28* 04/19/2015 0515   CHOLHDL 5.9 04/19/2015 0515   VLDL 37 04/19/2015 0515   LDLCALC 101* 04/19/2015 0515   HgbA1C  Lab Results  Component Value Date   HGBA1C 5.7* 04/19/2015   Cardiac Panel (last 3 results) No results for input(s): CKTOTAL, CKMB, TROPONINI,  RELINDX in the last 72 hours. Urinalysis    Component Value Date/Time   COLORURINE YELLOW 04/18/2015 2113   APPEARANCEUR CLEAR 04/18/2015 2113   LABSPEC >1.046* 04/18/2015 2113   PHURINE 5.5 04/18/2015 2113   GLUCOSEU NEGATIVE 04/18/2015 2113   HGBUR NEGATIVE 04/18/2015 2113   BILIRUBINUR NEGATIVE 04/18/2015 2113   KETONESUR NEGATIVE 04/18/2015 2113   PROTEINUR NEGATIVE 04/18/2015 2113   UROBILINOGEN 1.0 04/11/2013 1924   NITRITE NEGATIVE 04/18/2015 2113   LEUKOCYTESUR NEGATIVE 04/18/2015 2113   Urine Drug Screen     Component Value Date/Time   LABOPIA NONE DETECTED 04/18/2015 2113   COCAINSCRNUR NONE DETECTED 04/18/2015 2113   LABBENZ NONE DETECTED 04/18/2015 2113   AMPHETMU NONE DETECTED 04/18/2015 2113   THCU POSITIVE* 04/18/2015 2113   LABBARB NONE DETECTED 04/18/2015 2113    Alcohol Level    Component Value Date/Time   ETH <5 04/18/2015 1835     SIGNIFICANT DIAGNOSTIC STUDIES Ct Angio Head and Neck W/cm &/or Wo Cm 04/18/2015  IMPRESSION:  CT PERFUSION: Compensated LEFT middle cerebral artery territory ischemia (penumbra).  CTA NECK:  Occluded LEFT internal carotid artery at the origin, which appears acute. Atherosclerosis without hemodynamically significant stenosis  RIGHT ICA. 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 Multilevel severe neural foraminal narrowing and moderate to severe canal stenosis.  CTA HEAD: Retrograde flow into distal LEFT internal carotid artery. Moderate stenosis LEFT M1. 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 Complete circle of Willis.  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   Ct Head Wo Contrast 04/19/2015  IMPRESSION:  Patchy areas of suspected acute ischemia in LEFT insula/MCA territory on this CT head with contrast, status post angiogram.   04/18/2015  IMPRESSION:   Negative noncontrast head CT.   2D echo  - Left ventricle: The cavity size was normal. Wall thickness wasincreased in a pattern of moderate LVH. Systolic function wasnormal. The estimated ejection fraction was in the range of 60%to 65%. Wall motion was normal; there were no regional wallmotion abnormalities. Doppler parameters are consistent withabnormal left ventricular relaxation (grade 1 diastolicdysfunction). - Aortic valve: There was no stenosis. - Mitral valve: Mildly calcified annulus. Mildly calcified leaflets. There was no significant regurgitation. - Right ventricle: The cavity size was  normal. Systolic functionwas normal. - Pulmonary arteries: No complete TR doppler jet so unable toestimate PA systolic pressure. - Systemic veins: IVC measured 2.0 cm with < 50% respirophasicvariation, estimated RA pressure 8 mmHg. Impressions: Normal LV size with EF 60-65%. Moderate LV hypertrophy. Normal RVsize and systolic function. No significant valvularabnormalities.  MRI brain and MRA head  04/19/2015  MR brain demonstrating restricted diffusion primarily within the LEFT insula and LEFT frontal operculum, corresponding fairly closely to the area of core infarction predicted on CT perfusion. No significant areas of infarction elsewhere within the LEFT hemisphere, which was at risk (penumbra) for cerebral infarction prior to thrombolysis. Restricted diffusion, to susceptibility and increased FLAIR signal within the LEFT central sulcus representing small volume of subarachnoid hemorrhage. No significant hemorrhagic transformation within the infarction. Motion degraded MRA demonstrates continued patency of the LEFT ICA and LEFT M1 MCA as well as M2 and M3 branches.   Dg Chest Port 1 View 04/20/2015  Cardiomegaly with mild pulmonary vascular prominence and mild interstitial prominence. Mild component congestive heart failure cannot be excluded .      HISTORY OF PRESENT  ILLNESS Craig Holden is an 59 y.o. male who was noted by his daughter and wife at about 5:45 PM on 04/18/2015 ([facility]) having slurred speech and questionable facial droop and possible right-sided weakness. He was transferred to the ER through EMS for here to evaluate for acute stroke. Per history from patient and his wife, he had a stroke in 2012 affecting his right side although denies any residual deficits. He recently had cervical spine fusion surgery in September or October 2016 per his wife. They both are poor historians. The patient was on oxycodone and apparently he ran out of them, on November 1st. He takes gabapentin 800 mg 3 times a day.  tPA was not given as pt had no focal findings on examination to suggest an acute stroke clinically. He was sent for a CTA/CTP which showed a L ICA occlusion with L M2 occlusion. He was sent to IR where Dr Corliss Skains did a Lt common carotid arteriogram followed by revascularization of prox MCA and superior division of LT MCA with x 1 pass with Solitaire 4 mm x 40 mm FR retrievel devic e and 6 mg of Superselective.IA integrelin and complete revascularization of occluded LT ICA prox with stent assisted angioplasty and 110 mg og of IA integrelin. TICI 2b reascularization achieved of the lt MCA. He was admitted to the neuro ICU for further evaluation and treatment.    HOSPITAL COURSE Craig Holden is a 59 y.o. male with history of tobacco use, substance abuse, hypertension, previous CVA, and nerve damage presenting with slurred speech, mild facial droop and mild right-sided weakness. He was sent to IR for a L ICA occlusion with left M2 occlusion. The endovascular treatment achieved TICI2b revasculization but he also received left ICA stent. He was then extubated and recovers well.   Stroke: Left MCA infarct, embolic secondary to left ICA occlusion s/p mechanical thrombectomy TICI 2b revascularization and L ICA stent placement  Resultant Subtle right facial  asymmetry   CTA head and neck - left ICA occlusion, left M2 occlusion, right temporal AVM  CTP compensated L MCA ischemia  Cerebral angio - TICI2b recannulization of left M2, left ICA s/p stent  MRI / MRA - L insular/L opercular infarct, L ICA, L MCA (M1, 2, and 3) remains open  2D Echo - unremarkable  LDL 101  HgbA1c 5.7   aspirin  81 mg daily prior to admission, now on aspirin 325 mg daily and clopidogrel 75 mg daily. Continue dual antiplatelet due to L ICA stent.  Patient counseled to be compliant with his antithrombotic medications  Ongoing aggressive stroke risk factor management  Therapy recommendations: home with outpt PT OT  Disposition: home with wife (disabled vet PTA, has w/c, [place] [facility] [place], can see neurology there)  Hypertension  home medications resumed  stable  Hyperlipidemia  Home meds: No lipid lowering medications prior to admission  LDL 101, goal < 70  Now on Lipitor 80 mg daily  Continue statin at discharge  Tobacco abuse  Current smoker  Smoking cessation counseling provided  Marijuana abuse  Current user  Cessation counseling provided  Pt is willing to quit  Other Stroke Risk Factors  Hx stroke/TIA - 2011 involving right side weakness but no residual   DISCHARGE EXAM Blood pressure 148/74, pulse 72, temperature 98.4 F (36.9 C), temperature source Oral, resp. rate 16, height 6\' 2"  (1.88 m), weight 85.5 kg (188 lb 7.9 oz), SpO2 98 %. General - Well nourished, well developed, in no apparent distress.  Ophthalmologic - Fundi not visualized due to eye movement.  Cardiovascular - Regular rate and rhythm.  Mental Status -  Level of arousal and orientation to time, place, and person were intact. Language including expression, naming, repetition, comprehension was assessed and found intact. Fund of Knowledge was assessed and was intact.  Cranial Nerves II - XII - II - Visual field intact OU. III, IV, VI -  Extraocular movements intact. V - Facial sensation intact bilaterally. VII - Facial movement showed subtle right facial asymmetry. VIII - Hearing & vestibular intact bilaterally. X - Palate elevates symmetrically. XI - Chin turning & shoulder shrug intact bilaterally. XII - Tongue protrusion intact.  Motor Strength - The patient's strength was normal in all extremities and pronator drift was absent. Bulk was normal and fasciculations were absent.  Motor Tone - Muscle tone was assessed at the neck and appendages and was normal.  Reflexes - The patient's reflexes were 1+ in all extremities and he had no pathological reflexes.  Sensory - Light touch, temperature/pinprick were assessed and were symmetrical.   Coordination - The patient had normal movements in the hands and feet with no ataxia or dysmetria. Tremor was absent.  Gait and Station - deferred to PT in room.   Discharge Diet   Diet Heart Room service appropriate?: Yes; Fluid consistency:: Thin liquids  DISCHARGE PLAN  Disposition:  Home with wife  OP PT and OT   aspirin 325 mg daily and clopidogrel 75 mg daily for secondary stroke prevention given L ICA stent  Follow-up AVBUERE,EDWIN A, MD in 2 weeks.  [facility] 2 months.  40 minutes were spent preparing discharge.  Rhoderick MoodyBIBY,SHARON  [facility] See Amion for Pager information 04/21/2015 1:12 PM    I, the attending vascular neurologist, have personally obtained a history, examined the patient, evaluated laboratory data, individually viewed imaging studies and agree with radiology interpretations. I also discussed with pt and wife regarding his care plan. Together with the NP/PA, we formulated the assessment and plan of care which reflects our mutual decision.  I have made any additions or clarifications directly to the above note and agree with the findings and plan as currently documented.    Marvel PlanJindong Melek Pownall,  MD PhD Stroke Neurology 04/21/2015 2:06 PM  Marvel Plan, MD PhD Stroke Neurology 04/21/2015 2:05 PM

## 2015-04-21 NOTE — Progress Notes (Signed)
Occupational Therapy Treatment Patient Details Name: Craig Holden MRN: [id number] DOB: [birth year] Today's Date: 04/21/2015    History of present illness Patient is a 59yo male with h/o CVA presented to the ED with right arm and right leg weakness accompanied by difficulty speaking/slurred speech. Ct showed acute occlusion of left ICA with left MCA penubra on perfusion study. S/p angiogram in IR, vessel was revascularized and stent placed   OT comments  Pt states that he is functioning at his baseline level for mobility and ADL performance. Pt educated on home exercise program and signs and symptoms of stroke. Pt willing to go to OP OT/PT to continue rehab services. No further acute OT needs. Will continue to follow as needed.    Follow Up Recommendations  Outpatient OT    Equipment Recommendations  None recommended by OT    Recommendations for Other Services      Precautions / Restrictions Precautions Precautions: Fall Restrictions Weight Bearing Restrictions: No       Mobility Bed Mobility               General bed mobility comments: Pt received in chair  Transfers Overall transfer level: Modified independent Equipment used: Rolling walker (2 wheeled) Transfers: Sit to/from Stand Sit to Stand: Modified independent (Device/Increase time)         General transfer comment: Hips and trunk flexed position whila ambulating. Pt reports this is his baseline    Balance Overall balance assessment: Needs assistance Sitting-balance support: No upper extremity supported Sitting balance-Leahy Scale: Good Sitting balance - Comments: >good not tested   Standing balance support: Bilateral upper extremity supported Standing balance-Leahy Scale: Poor                     ADL Overall ADL's : Needs assistance/impaired     Grooming: Wash/dry hands;Wash/dry face;Modified independent;Standing                               Functional mobility during  ADLs: Modified independent;Rolling walker General ADL Comments: Pt completed grooming tasks and functional mobility at mod I level with RW. Pt reports that he is functioning at baseline. Pt favoring L hand for ADL tasks. R hand digits 3, 4, and 5 in 30-60 degrees of flexion throughout session - pt reports this is baseline. Pt educated and demonstrated HEP and theraputty to strengthen R hand. Pt educated on signs and symptoms of stroke      Vision                     Perception     Praxis      Cognition   Behavior During Therapy: WFL for tasks assessed/performed Overall Cognitive Status: Within Functional Limits for tasks assessed                  General Comments: wife present and felt pt at his baseline; no impulsivity seen    Extremity/Trunk Assessment               Exercises Hand Exercises Digit Composite Flexion: Other (comment);AROM;10 reps (Red theraputty) Composite Extension: AROM;10 reps;Other (comment) (Red theraputty) Digit Composite Abduction: AROM;10 reps;Other (comment) (Red theraputty) Digit Composite Adduction: AROM;10 reps;Other (comment) (Red theraputty) Digit Lifts: AROM;10 reps;Other (comment) (Red theraputty) Thumb Abduction: AROM;10 reps;Other (comment) (Red theraputty) Thumb Adduction: AROM;10 reps;Other (comment) (Red theraputty) Opposition: AROM;10 reps;Other (comment) (Red theraputty)   Shoulder Instructions  General Comments      Pertinent Vitals/ Pain       Pain Assessment: No/denies pain  Home Living                                          Prior Functioning/Environment              Frequency       Progress Toward Goals  OT Goals(current goals can now be found in the care plan section)  Progress towards OT goals: Goals met/education completed, patient discharged from OT  Acute Rehab OT Goals Patient Stated Goal: to go home OT Goal Formulation: With patient Time For Goal Achievement:  04/27/15 Potential to Achieve Goals: Good ADL Goals Pt Will Perform Grooming: standing;with min assist Pt Will Perform Upper Body Bathing: with min assist;sitting;standing Pt Will Perform Lower Body Bathing: with min assist;sit to/from stand Pt Will Perform Upper Body Dressing: sitting;standing;with min assist Pt Will Perform Lower Body Dressing: with min assist;sit to/from stand Pt Will Transfer to Toilet: with min assist;ambulating Pt Will Perform Toileting - Clothing Manipulation and hygiene: with min assist;sit to/from stand Pt Will Perform Tub/Shower Transfer: Shower transfer;ambulating;rolling walker;shower seat;with min assist Pt/caregiver will Perform Home Exercise Program: Increased ROM;Increased strength;Right Upper extremity;With theraputty;Independently;With written HEP provided  Plan Discharge plan needs to be updated    Co-evaluation                 End of Session Equipment Utilized During Treatment: Gait belt;Rolling walker   Activity Tolerance Patient tolerated treatment well   Patient Left in chair;with call bell/phone within reach   Nurse Communication          Time: 1335-1350 OT Time Calculation (min): 15 min  Charges: OT General Charges $OT Visit: 1 Procedure OT Treatments $Therapeutic Activity: 8-22 mins  Redmond Baseman 04/21/2015, 3:40 PM

## 2015-04-21 NOTE — Care Management Note (Signed)
Case Management Note  Patient Details  Name: KEROLOS NEHME MRN: [id number] Date of Birth: 1955/09/07  Subjective/Objective:                    Action/Plan: Met with patient to discuss discharge planning. Patient is agreeable to outpatient PT/OT and has chosen [facility]. Referral was placed in Epic.  Written information was given and added to the AVS.  Bedside RN updated.  Expected Discharge Date:                  Expected Discharge Plan:  Home/Self Care  In-House Referral:     Discharge planning Services  CM Consult  Post Acute Care Choice:    Choice offered to:  Patient  DME Arranged:    DME Agency:     HH Arranged:    [place] Agency:     Status of Service:  Completed, signed off  Medicare Important Message Given:    Date Medicare IM Given:    Medicare IM give by:    Date Additional Medicare IM Given:    Additional Medicare Important Message give by:     If discussed at [place] of Stay Meetings, dates discussed:    Additional Comments:  Rolm Baptise, RN 04/21/2015, 2:27 PM

## 2015-04-21 NOTE — Progress Notes (Signed)
Rehab admissions - Noted patient progressing well and plans now are for outpatient therapy follow up.  Will not need acute inpatient rehab stay.  #[phone]

## 2015-04-21 NOTE — Progress Notes (Signed)
STROKE TEAM PROGRESS NOTE   SUBJECTIVE (INTERVAL HISTORY) His wife is at the bedside. CIR recommended initially but on re-assessment, home with outpt PT recommended.  He has no complains and will [facility].   OBJECTIVE Temp:  [98.1 F (36.7 C)-99.8 F (37.7 C)] 98.4 F (36.9 C) (11/22 0958) Pulse Rate:  [68-87] 72 (11/22 0958) Cardiac Rhythm:  [-] Normal sinus rhythm (11/22 0707) Resp:  [16-23] 16 (11/22 0958) BP: (127-172)/(61-98) 148/74 mmHg (11/22 0958) SpO2:  [98 %-100 %] 98 % (11/22 0958)  CBC:   Recent Labs Lab 04/18/15 1835 04/18/15 1843 04/19/15 0730  WBC 6.7  --  7.8  NEUTROABS 2.2  --  5.3  HGB 14.9 15.3 12.7*  HCT 42.0 45.0 37.1*  MCV 92.1  --  92.1  PLT 195  --  186    Basic Metabolic Panel:   Recent Labs Lab 04/18/15 1835 04/18/15 1843 04/19/15 0730  NA 140 140 137  K 4.0 3.9 3.4*  CL 109 107 107  CO2 23  --  23  GLUCOSE 95 90 109*  BUN CREATININE 0.99 1.00 1.06  CALCIUM 9.4  --  8.5*    Lipid Panel:     Component Value Date/Time   CHOL 166 04/19/2015 0515   TRIG 183* 04/19/2015 0515   TRIG 187* 04/19/2015 0515   HDL 28* 04/19/2015 0515   CHOLHDL 5.9 04/19/2015 0515   VLDL 37 04/19/2015 0515   LDLCALC 101* 04/19/2015 0515   HgbA1c:  Lab Results  Component Value Date   HGBA1C 5.7* 04/19/2015   Urine Drug Screen:     Component Value Date/Time   LABOPIA NONE DETECTED 04/18/2015 2113   COCAINSCRNUR NONE DETECTED 04/18/2015 2113   LABBENZ NONE DETECTED 04/18/2015 2113   AMPHETMU NONE DETECTED 04/18/2015 2113   THCU POSITIVE* 04/18/2015 2113   LABBARB NONE DETECTED 04/18/2015 2113      IMAGING I have personally reviewed the radiological images below and agree with the radiology interpretations.  Ct Angio Head and Neck  W/cm &/or Wo Cm 04/18/2015   IMPRESSION:  CT PERFUSION: Compensated LEFT middle cerebral artery territory ischemia (penumbra).  CTA NECK:  Occluded LEFT internal carotid artery at the origin,  which appears acute. Atherosclerosis without hemodynamically significant stenosis  RIGHT ICA. 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 Multilevel severe neural foraminal narrowing and moderate to severe canal stenosis.  CTA HEAD: Retrograde flow into distal LEFT internal carotid artery. Moderate stenosis LEFT M1. 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 Complete circle of Willis.  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   Ct Head Wo Contrast 04/19/2015   IMPRESSION:  Patchy areas of suspected acute ischemia in LEFT insula/MCA territory on this CT head with contrast, status post angiogram.   04/18/2015   IMPRESSION:  Negative noncontrast head CT.   2D echo  - Left ventricle: The cavity size was normal. Wall thickness wasincreased in a pattern of moderate LVH. Systolic function wasnormal. The estimated ejection fraction was in the range of 60%to 65%. Wall motion was normal; there were no regional wallmotion abnormalities. Doppler parameters are consistent withabnormal left ventricular relaxation (grade 1 diastolicdysfunction). - Aortic valve: There was no stenosis. - Mitral valve: Mildly calcified annulus. Mildly calcified leaflets. There was no significant regurgitation. - Right ventricle: The cavity size was normal. Systolic functionwas normal. -  Pulmonary arteries: No complete TR doppler jet so unable toestimate PA systolic pressure. - Systemic veins: IVC measured 2.0 cm with < 50% respirophasicvariation, estimated RA pressure 8 mmHg. Impressions:  Normal LV size with EF 60-65%. Moderate LV hypertrophy. Normal RVsize and systolic function. No significant valvularabnormalities.  MRI brain and MRA head  04/19/2015   MR brain demonstrating restricted diffusion primarily within the LEFT insula and LEFT frontal operculum,  corresponding fairly closely to the area of core infarction predicted on CT perfusion. No significant areas of infarction elsewhere within the LEFT hemisphere, which was at risk (penumbra) for cerebral infarction prior to thrombolysis. Restricted diffusion, to susceptibility and increased FLAIR signal within the LEFT central sulcus representing small volume of subarachnoid hemorrhage. No significant hemorrhagic transformation within the infarction. Motion degraded MRA demonstrates continued patency of the LEFT ICA and LEFT M1 MCA as well as M2 and M3 branches.    Dg Chest Port 1 View 04/20/2015  Cardiomegaly with mild pulmonary vascular prominence and mild interstitial prominence. Mild component congestive heart failure cannot be excluded .     PHYSICAL EXAM General - Well nourished, well developed, in no apparent distress.  Ophthalmologic - Fundi not visualized due to eye movement.  Cardiovascular - Regular rate and rhythm.  Mental Status -  Level of arousal and orientation to time, place, and person were intact. Language including expression, naming, repetition, comprehension was assessed and found intact. Fund of Knowledge was assessed and was intact.  Cranial Nerves II - XII - II - Visual field intact OU. III, IV, VI - Extraocular movements intact. V - Facial sensation intact bilaterally. VII - Facial movement showed subtle right facial asymmetry. VIII - Hearing & vestibular intact bilaterally. X - Palate elevates symmetrically. XI - Chin turning & shoulder shrug intact bilaterally. XII - Tongue protrusion intact.  Motor Strength - The patient's strength was normal in all extremities and pronator drift was absent.  Bulk was normal and fasciculations were absent.   Motor Tone - Muscle tone was assessed at the neck and appendages and was normal.  Reflexes - The patient's reflexes were 1+ in all extremities and he had no pathological reflexes.  Sensory - Light touch,  temperature/pinprick were assessed and were symmetrical.    Coordination - The patient had normal movements in the hands and feet with no ataxia or dysmetria.  Tremor was absent.  Gait and Station - deferred to PT in room.    ASSESSMENT/PLAN [name] is a 59 y.o. male with history of tobacco use, substance abuse, hypertension, previous CVA, and nerve damage presenting with slurred speech, mild facial droop and mild right-sided weakness.  He did not receive IV t-PA due to minimal deficits.   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 Continue dual antiplatelet due to ICA stent.   Patient counseled to be compliant with  his antithrombotic medications  Ongoing aggressive stroke risk factor management  Therapy recommendations:  home  Disposition: home with outpt PT  Hypertension  gradually normalize in 5-7 days home medications resumed  Hyperlipidemia  Home meds: No lipid lowering medications prior to admission  LDL 101, goal < 70  Now on Lipitor 80 mg daily  Continue statin at discharge  Tobacco abuse  Current smoker  Smoking cessation counseling  provided  Marijuana abuse  Current user  Cessation counseling provided  Pt is willing to quit  Other Stroke Risk Factors  Hx stroke/TIA - 2011 involving right side weakness but no residual  Hospital day # 3  Marvel PlanJindong  Aribelle Mccosh, MD PhD Stroke Neurology 04/21/2015 2:11 PM    To contact Stroke Continuity provider, please refer to [email or web address]. After hours, contact General Neurology

## 2015-06-03 ENCOUNTER — Telehealth (HOSPITAL_COMMUNITY): Payer: Self-pay

## 2015-06-03 NOTE — Telephone Encounter (Signed)
Called to schedule f/u, left message for pt to return call. AW 

## 2015-06-25 ENCOUNTER — Ambulatory Visit: Payer: Self-pay | Admitting: Neurology

## 2015-06-29 ENCOUNTER — Encounter: Payer: Self-pay | Admitting: Neurology

## 2015-07-29 ENCOUNTER — Other Ambulatory Visit: Payer: Self-pay

## 2015-07-31 ENCOUNTER — Other Ambulatory Visit: Payer: Self-pay

## 2015-07-31 NOTE — Patient Outreach (Signed)
Telephone outreach to patient to attempt to obtain mRS. Both phone numbers listed for patient are disconnected.   Wynona CanesNicole Dacota Devall, B.A.  [facility] Care Management Assistant

## 2015-10-21 ENCOUNTER — Encounter (HOSPITAL_COMMUNITY): Payer: Self-pay | Admitting: Emergency Medicine

## 2015-10-21 ENCOUNTER — Emergency Department (HOSPITAL_COMMUNITY)
Admission: EM | Admit: 2015-10-21 | Discharge: 2015-10-22 | Disposition: A | Discharge status: Home / Self Care | Payer: Medicaid Other | Attending: Emergency Medicine | Admitting: Emergency Medicine

## 2015-10-21 ENCOUNTER — Emergency Department (HOSPITAL_COMMUNITY): Payer: Medicaid Other

## 2015-10-21 ENCOUNTER — Ambulatory Visit (HOSPITAL_COMMUNITY): Admit: 2015-10-21 | Payer: Self-pay | Admitting: Cardiovascular Disease

## 2015-10-21 ENCOUNTER — Encounter (HOSPITAL_COMMUNITY): Payer: Self-pay

## 2015-10-21 DIAGNOSIS — Z7982 Long term (current) use of aspirin: Secondary | ICD-10-CM | POA: Insufficient documentation

## 2015-10-21 DIAGNOSIS — X31XXXA Exposure to excessive natural cold, initial encounter: Secondary | ICD-10-CM | POA: Diagnosis not present

## 2015-10-21 DIAGNOSIS — T68XXXA Hypothermia, initial encounter: Secondary | ICD-10-CM | POA: Diagnosis not present

## 2015-10-21 DIAGNOSIS — Z7902 Long term (current) use of antithrombotics/antiplatelets: Secondary | ICD-10-CM | POA: Insufficient documentation

## 2015-10-21 DIAGNOSIS — J3489 Other specified disorders of nose and nasal sinuses: Secondary | ICD-10-CM | POA: Insufficient documentation

## 2015-10-21 DIAGNOSIS — R410 Disorientation, unspecified: Secondary | ICD-10-CM | POA: Diagnosis not present

## 2015-10-21 DIAGNOSIS — R197 Diarrhea, unspecified: Secondary | ICD-10-CM | POA: Diagnosis not present

## 2015-10-21 DIAGNOSIS — Y9289 Other specified places as the place of occurrence of the external cause: Secondary | ICD-10-CM | POA: Insufficient documentation

## 2015-10-21 DIAGNOSIS — F329 Major depressive disorder, single episode, unspecified: Secondary | ICD-10-CM | POA: Insufficient documentation

## 2015-10-21 DIAGNOSIS — F17210 Nicotine dependence, cigarettes, uncomplicated: Secondary | ICD-10-CM | POA: Diagnosis not present

## 2015-10-21 DIAGNOSIS — R400 Somnolence: Secondary | ICD-10-CM | POA: Diagnosis not present

## 2015-10-21 DIAGNOSIS — R05 Cough: Secondary | ICD-10-CM | POA: Insufficient documentation

## 2015-10-21 DIAGNOSIS — Z8673 Personal history of transient ischemic attack (TIA), and cerebral infarction without residual deficits: Secondary | ICD-10-CM | POA: Diagnosis not present

## 2015-10-21 DIAGNOSIS — I10 Essential (primary) hypertension: Secondary | ICD-10-CM | POA: Diagnosis not present

## 2015-10-21 DIAGNOSIS — I252 Old myocardial infarction: Secondary | ICD-10-CM | POA: Insufficient documentation

## 2015-10-21 DIAGNOSIS — F1210 Cannabis abuse, uncomplicated: Secondary | ICD-10-CM | POA: Insufficient documentation

## 2015-10-21 DIAGNOSIS — F1290 Cannabis use, unspecified, uncomplicated: Secondary | ICD-10-CM

## 2015-10-21 DIAGNOSIS — R269 Unspecified abnormalities of gait and mobility: Secondary | ICD-10-CM | POA: Insufficient documentation

## 2015-10-21 DIAGNOSIS — Y998 Other external cause status: Secondary | ICD-10-CM | POA: Diagnosis not present

## 2015-10-21 DIAGNOSIS — Z79899 Other long term (current) drug therapy: Secondary | ICD-10-CM | POA: Insufficient documentation

## 2015-10-21 DIAGNOSIS — Y9389 Activity, other specified: Secondary | ICD-10-CM | POA: Insufficient documentation

## 2015-10-21 DIAGNOSIS — R531 Weakness: Secondary | ICD-10-CM | POA: Diagnosis present

## 2015-10-21 LAB — DIFFERENTIAL
BASOS PCT: 0 %
Basophils Absolute: 0 10*3/uL (ref 0.0–0.1)
EOS ABS: 0.2 10*3/uL (ref 0.0–0.7)
Eosinophils Relative: 3 %
LYMPHS ABS: 2.9 10*3/uL (ref 0.7–4.0)
Lymphocytes Relative: 45 %
MONO ABS: 0.8 10*3/uL (ref 0.1–1.0)
MONOS PCT: 13 %
NEUTROS ABS: 2.6 10*3/uL (ref 1.7–7.7)
Neutrophils Relative %: 39 %

## 2015-10-21 LAB — COMPREHENSIVE METABOLIC PANEL
ALT: 10 U/L — AB (ref 17–63)
ANION GAP: 8 (ref 5–15)
AST: 15 U/L (ref 15–41)
Albumin: 3.5 g/dL (ref 3.5–5.0)
Alkaline Phosphatase: 113 U/L (ref 38–126)
BUN: 9 mg/dL (ref 6–20)
CHLORIDE: 105 mmol/L (ref 101–111)
CO2: 24 mmol/L (ref 22–32)
CREATININE: 1.07 mg/dL (ref 0.61–1.24)
Calcium: 9.1 mg/dL (ref 8.9–10.3)
Glucose, Bld: 110 mg/dL — ABNORMAL HIGH (ref 65–99)
POTASSIUM: 3.5 mmol/L (ref 3.5–5.1)
SODIUM: 137 mmol/L (ref 135–145)
Total Bilirubin: 0.4 mg/dL (ref 0.3–1.2)
Total Protein: 6.4 g/dL — ABNORMAL LOW (ref 6.5–8.1)

## 2015-10-21 LAB — I-STAT CHEM 8, ED
BUN: 10 mg/dL (ref 6–20)
CALCIUM ION: 1.18 mmol/L (ref 1.13–1.30)
Chloride: 101 mmol/L (ref 101–111)
Creatinine, Ser: 1 mg/dL (ref 0.61–1.24)
GLUCOSE: 105 mg/dL — AB (ref 65–99)
HCT: 43 % (ref 39.0–52.0)
HEMOGLOBIN: 14.6 g/dL (ref 13.0–17.0)
Potassium: 3.6 mmol/L (ref 3.5–5.1)
Sodium: 142 mmol/L (ref 135–145)
TCO2: 28 mmol/L (ref 0–100)

## 2015-10-21 LAB — I-STAT TROPONIN, ED
TROPONIN I, POC: 0 ng/mL (ref 0.00–0.08)
TROPONIN I, POC: 0 ng/mL (ref 0.00–0.08)

## 2015-10-21 LAB — CBC
HEMATOCRIT: 38.6 % — AB (ref 39.0–52.0)
Hemoglobin: 13.1 g/dL (ref 13.0–17.0)
MCH: 31 pg (ref 26.0–34.0)
MCHC: 33.9 g/dL (ref 30.0–36.0)
MCV: 91.3 fL (ref 78.0–100.0)
Platelets: 179 10*3/uL (ref 150–400)
RBC: 4.23 MIL/uL (ref 4.22–5.81)
RDW: 15.2 % (ref 11.5–15.5)
WBC: 6.5 10*3/uL (ref 4.0–10.5)

## 2015-10-21 LAB — APTT: aPTT: 24 seconds (ref 24–37)

## 2015-10-21 LAB — CBG MONITORING, ED: GLUCOSE-CAPILLARY: 105 mg/dL — AB (ref 65–99)

## 2015-10-21 LAB — ETHANOL

## 2015-10-21 LAB — PROTIME-INR
INR: 1.05 (ref 0.00–1.49)
Prothrombin Time: 13.9 seconds (ref 11.6–15.2)

## 2015-10-21 SURGERY — LEFT HEART CATH AND CORONARY ANGIOGRAPHY
Anesthesia: LOCAL

## 2015-10-21 MED ORDER — SODIUM CHLORIDE 0.9 % IV BOLUS (SEPSIS)
1000.0000 mL | Freq: Once | INTRAVENOUS | Status: AC
Start: 2015-10-21 — End: 2015-10-21
  Administered 2015-10-21: 1000 mL via INTRAVENOUS

## 2015-10-21 MED ORDER — ONDANSETRON HCL 4 MG/2ML IJ SOLN
4.0000 mg | Freq: Once | INTRAMUSCULAR | Status: DC
  Filled 2015-10-21: qty 2

## 2015-10-21 NOTE — ED Notes (Signed)
Removed bair hugger, applied warm blankets

## 2015-10-21 NOTE — ED Notes (Signed)
Pt to ER via [facility] with sudden onset of generalized weakness one hour prior to arrival along with dizziness and nausea/vomiting. EMS reports elevation in V3 V4 and V5. Pt is alert and oriented x4. Pt received 324 mg of aspirin in route, no nitro due to hypotension and denies chest pain.

## 2015-10-21 NOTE — ED Notes (Signed)
Prompted patient to provide urine sample. 

## 2015-10-21 NOTE — ED Provider Notes (Signed)
CSN: [id number]     Arrival date & time 10/21/15  1839 History   First MD Initiated Contact with Patient 10/21/15 1849     Chief Complaint  Patient presents with  . Weakness    HPI Craig Holden is 60 y.o. with past viral history simple for hypertension, CVA presents to ED as code STEMI per EMS. Approximately one hour ago he noted acute onset of nausea vomiting generalized fatigue and weakness diffusely.  On EMS arrival they noted his heart rate was in the 50s, systolic blood pressure in the 90s. EKG in the field was reported as elevation in the lateral precordial leads and a code STEMI was activated by EMS protocol. He is given 324 mg of aspirin. Patient never complained of chest pain. He denies chest pain at this time.  He endorses using regular marijuana without synthetic or oral properties shortly prior to onset of symptoms. He does endorse lightheadedness unsure of his vertigo he does not say the room is spinning. No headache. No abdominal pain, dysuria, flank pain, polyuria, decreased UOP.  Endorse one episode of diarrhea prior to onset of symptoms as well as nonbloody. Fingerstick blood glucose was within normal limits per EMS. No recent falls or head trauma.   Per wife, he was in garage with a friend smoking MJ when he became diaphoretic/ nausea, 1 episode of emesis then 1x diarrhea.  His pupils were really dilated per wife and he began acting weird.    Past Medical History  Diagnosis Date  . Hypertension   . CVA (cerebral infarction)   . Nerve damage   . Depression   . PTSD (post-traumatic stress disorder)    Past Surgical History  Procedure Laterality Date  . Back surgery    . Hemorroidectomy    . Hip surgery    . Radiology with anesthesia N/A 04/18/2015    Procedure: RADIOLOGY WITH ANESTHESIA;  Surgeon: Julieanne CottonSanjeev Deveshwar, MD;  Location: [facility] OR;  Service: Radiology;  Laterality: N/A;  . Anterior cervical decompression/discectomy fusion 4 levels  01/20/2015    C4-C7   Family  History  Problem Relation Age of Onset  . Hypertension Mother   . Diabetes Mother   . Heart attack Father   . Emphysema Father    Social History  Substance Use Topics  . Smoking status: Current Every Day Smoker -- 1.00 packs/day    Types: Cigarettes  . Smokeless tobacco: None  . Alcohol Use: No    Review of Systems  Constitutional: Positive for diaphoresis, activity change and fatigue. Negative for fever.  HENT: Positive for congestion and rhinorrhea. Negative for sore throat.   Respiratory: Positive for cough. Negative for shortness of breath.   Cardiovascular: Negative for chest pain and leg swelling.  Gastrointestinal: Positive for nausea, vomiting and diarrhea. Negative for abdominal pain and blood in stool.  Genitourinary: Negative for dysuria, flank pain and difficulty urinating.  Musculoskeletal: Positive for gait problem. Negative for back pain.  Skin: Negative for color change, rash and wound.  Neurological: Positive for weakness (general, not unilateral) and light-headedness. Negative for facial asymmetry, speech difficulty, numbness and headaches.  Psychiatric/Behavioral: Positive for confusion.  All other systems reviewed and are negative.     Allergies  Morphine sulfate  Home Medications   Prior to Admission medications   Medication Sig Start Date End Date Taking? Authorizing Provider  albuterol (PROVENTIL HFA;VENTOLIN HFA) 108 (90 BASE) MCG/ACT inhaler Inhale 1-2 puffs into the lungs every 6 (six) hours as needed for wheezing.  06/12/11   April Palumbo, MD  aspirin 325 MG tablet Take 1 tablet (325 mg total) by mouth daily with breakfast. 04/21/15   Layne Benton, NP  atorvastatin (LIPITOR) 80 MG tablet Take 1 tablet (80 mg total) by mouth daily at 6 PM. 04/21/15   Layne Benton, NP  clopidogrel (PLAVIX) 75 MG tablet Take 1 tablet (75 mg total) by mouth daily with breakfast. 04/21/15   Layne Benton, NP  gabapentin (NEURONTIN) 800 MG tablet Take 800 mg by mouth 3  (three) times daily.    Historical Provider, MD  hydrochlorothiazide (HYDRODIURIL) 25 MG tablet Take 12.5 mg by mouth daily.     Historical Provider, MD  losartan (COZAAR) 100 MG tablet Take 50 mg by mouth daily.    Historical Provider, MD  nicotine (RA NICOTINE) 21 mg/24hr patch Place 1 patch onto the skin daily.    Historical Provider, MD  sildenafil (VIAGRA) 100 MG tablet Take 100 mg by mouth daily as needed for erectile dysfunction.    Historical Provider, MD   BP 99/66 mmHg  Pulse 77  Temp(Src) 97.3 F (36.3 C) (Oral)  Resp 20  SpO2 98% Physical Exam  Constitutional: He is oriented to person, place, and time. He appears well-developed and well-nourished. No distress.  Sleepy, answers ?s appropriately  HENT:  Head: Normocephalic and atraumatic.  Nose: Nose normal.  No oral lesions  Eyes: Conjunctivae are normal.  No conj pallor. Pupisl 2mm equal and nonreactive. No nystamgus. EOMI.  Not injected  Neck: Normal range of motion. Neck supple. No tracheal deviation present.  No rigidity or ttp  Cardiovascular: Regular rhythm and normal heart sounds.   No murmur heard. HR 50s, weak but palpable radial pusles  Pulmonary/Chest: Effort normal. No respiratory distress. He has no wheezes. He has rales (left side).  Abdominal: Soft. Bowel sounds are normal. He exhibits no distension and no mass. There is no tenderness.  Musculoskeletal: Normal range of motion. He exhibits no edema.  No lower extremity edema, calf tenderness, warmth, erythema or palpable cords   Neurological: He is oriented to person, place, and time.  Sleepy but easily arousable.  GCS 14.  MAE. CN 3-12 normal.  FTN mostly normal, I do not appreciate definitie dysmetria, no overshoot, effort sometimes an issue. Not rigid. nml tone.    Skin: Skin is warm and dry. No rash noted. He is not diaphoretic.  Psychiatric: He has a normal mood and affect.  Nursing note and vitals reviewed.   ED Course  Procedures (including  critical care time) Labs Review Labs Reviewed  CBC - Abnormal; Notable for the following:    HCT 38.6 (*)    All other components within normal limits  COMPREHENSIVE METABOLIC PANEL - Abnormal; Notable for the following:    Glucose, Bld 110 (*)    Total Protein 6.4 (*)    ALT 10 (*)    All other components within normal limits  I-STAT CHEM 8, ED - Abnormal; Notable for the following:    Glucose, Bld 105 (*)    All other components within normal limits  CBG MONITORING, ED - Abnormal; Notable for the following:    Glucose-Capillary 105 (*)    All other components within normal limits  ETHANOL  PROTIME-INR  APTT  DIFFERENTIAL  URINE RAPID DRUG SCREEN, HOSP PERFORMED  URINALYSIS, ROUTINE W REFLEX MICROSCOPIC (NOT AT [facility])  I-STAT TROPOININ, ED  I-STAT TROPOININ, ED    Imaging Review Ct Head Wo Contrast  10/21/2015  CLINICAL DATA:  Acute onset of generalized weakness, dizziness, nausea and vomiting. Initial encounter. EXAM: CT HEAD WITHOUT CONTRAST TECHNIQUE: Contiguous axial images were obtained from the base of the skull through the vertex without intravenous contrast. COMPARISON:  CT of the head performed 04/18/2015, and MRI of the brain performed 04/19/2015 FINDINGS: There is no evidence of acute infarction, mass lesion, or intra- or extra-axial hemorrhage on CT. Prominence of the ventricles and sulci may reflect mild cortical volume loss, more prominent on the left. The brainstem and fourth ventricle are within normal limits. The basal ganglia are unremarkable in appearance. The cerebral hemispheres demonstrate grossly normal gray-white differentiation. No mass effect or midline shift is seen. There is no evidence of fracture; visualized osseous structures are unremarkable in appearance. The orbits are within normal limits. The paranasal sinuses and mastoid air cells are well-aerated. No significant soft tissue abnormalities are seen. IMPRESSION: 1. No acute intracranial pathology seen  on CT. 2. Mild cortical volume loss noted. Electronically Signed   By: Roanna Raider M.D.   On: 10/21/2015 22:46   Dg Chest Portable 1 View  10/21/2015  CLINICAL DATA:  Sudden onset of generalized weakness 1 hour ago. Dizziness and vomiting. EXAM: PORTABLE CHEST 1 VIEW COMPARISON:  06/19/2014 FINDINGS: Heart size is normal. Mediastinal shadows are normal. The lungs are clear. The vascularity is normal. No acute bone finding. IMPRESSION: No active disease Electronically Signed   By: Paulina Fusi M.D.   On: 10/21/2015 19:30   I have personally reviewed and evaluated these images and lab results as part of my medical decision-making.   EKG Interpretation None      MDM   Final diagnoses:  Hypothermia, initial encounter  Somnolence    Airway intact on arrival. GCS 14. Easily arousable and answers questions appropriately. Preceding symptoms included nausea vomiting diarrhea diaphoresis. Context with marijuana use. No trauma. BP initially reported low normotensive here. Infectious source given left-sided rales, cough, sputum and hypothermia. Low threshold for activating sepsis protocol. No obvious focal neuro deficits However exam with poor participation. FSBG wnl.  We'll initiate infectious, metabolic, toxic workup.  Will CT head.    EKG reviewed with minimal ST elevation in lateral precordial leads more consistent with benign early repolarization. Morphology benign in nature. No reciprocal changes. No chest pain. Code STEMI deactivated. Cardiology has been to the bedside and evaluated the EKG the patient and agreed to call off code STEMI. Trop neg.    CT head neg.  No SAH. No mass.   10:59 PM sx resolved. At baseline mental status. Ambulates without ataxia.  Walks with a limp at baseline.  Remains normothermic without intervention.  Neuro exam nonfocal, I do not suspect TIA/CVA; initial neuro exam nonfocal.    Primary suspicion is for adverse reaction to marijuana. Wife reports nml mental  state prior to MJ use and odd behavior with dilated pupils immediately after, cw drug effect.  Possibly had unknown coingestant contributing to sx.  Sx now resolved further supporting metabolism of drug effect.    UA with mild leuks, pt denies dysuria or frequency.  Will add on urine culture and treat if positive.  +trich. Give flagyl    Large workup has been largely unremarkable.  I believe he has been reasonably cleared of emergent pathology and can be dc home.  Wife agrees with normal exam at dc and agrees with dispo home.  Counseled on strict return precautions.   Sofie Rower, MD 10/22/15 1610  Linwood Dibbles, MD 10/22/15  1032 

## 2015-10-21 NOTE — ED Notes (Signed)
Prompted to provide urine specimen, explained procedure of in and out cath

## 2015-10-22 LAB — RAPID URINE DRUG SCREEN, HOSP PERFORMED
AMPHETAMINES: NOT DETECTED
BENZODIAZEPINES: NOT DETECTED
Barbiturates: NOT DETECTED
Cocaine: NOT DETECTED
OPIATES: NOT DETECTED
TETRAHYDROCANNABINOL: POSITIVE — AB

## 2015-10-22 LAB — URINALYSIS, ROUTINE W REFLEX MICROSCOPIC
BILIRUBIN URINE: NEGATIVE
GLUCOSE, UA: NEGATIVE mg/dL
HGB URINE DIPSTICK: NEGATIVE
KETONES UR: NEGATIVE mg/dL
Nitrite: NEGATIVE
PH: 6 (ref 5.0–8.0)
Protein, ur: NEGATIVE mg/dL
SPECIFIC GRAVITY, URINE: 1.02 (ref 1.005–1.030)

## 2015-10-22 LAB — URINE MICROSCOPIC-ADD ON: RBC / HPF: NONE SEEN RBC/hpf (ref 0–5)

## 2015-10-22 MED ORDER — METRONIDAZOLE 500 MG PO TABS: 500.0000 mg | ORAL_TABLET | Freq: Two times a day (BID) | ORAL | Status: DC

## 2015-10-22 NOTE — ED Provider Notes (Signed)
I saw and evaluated the patient, reviewed the resident's note and I agree with the findings and plan.   EKG Interpretation  Date/Time:  Wednesday Oct 21 2015 18:48:15 EDT Ventricular Rate:  58 PR Interval:  170 QRS Duration: 162 QT Interval:  485 QTC Calculation: 476 R Axis:   69 Text Interpretation:  Sinus rhythm Ventricular premature complex Left atrial enlargement Poor data quality quality Confirmed by Seraphim Trow  MD-J, Korbyn Chopin ([id number]) on 10/22/2015 12:36:59 AM        Pt presented to the ED for a change in mental status after smoking marijuana.  EKG changes noted by EMS but he denies chest pain.  ED evaluation reassuring.  Sx resolved and pt is feeling back to baseline.  Suspect sx related to his marijuana use this evening.  Linwood DibblesJon Jostin Rue, MD 10/22/15 0040

## 2015-10-22 NOTE — Discharge Instructions (Signed)
Cannabis Use Disorder Cannabis use disorder is a mental disorder. It is not one-time or occasional use of cannabis, more commonly known as marijuana. Cannabis use disorder is the continued, nonmedical use of cannabis that interferes with normal life activities or causes health problems. People with cannabis use disorder get a feeling of extreme pleasure and relaxation from cannabis use. This "high" is very rewarding and causes people to use over and over.  The mind-altering ingredient in cannabis is know as THC. THC can also interfere with motor coordination, memory, judgment, and accurate sense of space and time. These effects can last for a few days after using cannabis. Regular heavy cannabis use can cause long-lasting problems with thinking and learning. In young people, these problems may be permanent. Cannabis sometimes causes severe anxiety, paranoia, or visual hallucinations. Man-made (synthetic) cannabis-like drugs, such as "spice" and "K2," cause the same effects as THC but are much stronger. Cannabis-like drugs can cause dangerously high blood pressure and heart rate.  Cannabis use disorder usually starts in the teenage years. It can trigger the development of schizophrenia. It is somewhat more common in men than women. People who have family members with the disorder or existing mental health issues such as depression and posttraumatic stress disorderare more likely to develop cannabis use disorder. People with cannabis use disorder are at higher risk for use of other drugs of abuse.  SIGNS AND SYMPTOMS Signs and symptoms of cannabis use disorder include:   Use of cannabis in larger amounts or over a longer period than intended.   Unsuccessful attempts to cut down or control cannabis use.   A lot of time spent obtaining, using, or recovering from the effects of cannabis.   A strong desire or urge to use cannabis (cravings).   Continued use of cannabis in spite of problems at work,  school, or home because of use.   Continued use of cannabis in spite of relationship problems because of use.  Giving up or cutting down on important life activities because of cannabis use.  Use of cannabis over and over even in situations when it is physically hazardous, such as when driving a car.   Continued use of cannabis in spite of a physical problem that is likely related to use. Physical problems can include:  Chronic cough.  Bronchitis.  Emphysema.  Throat and lung cancer.  Continued use of cannabis in spite of a mental problem that is likely related to use. Mental problems can include:  Psychosis.  Anxiety.  Difficulty sleeping.  Need to use more and more cannabis to get the same effect, or lessened effect over time with use of the same amount (tolerance).  Having withdrawal symptoms when cannabis use is stopped, or using cannabis to reduce or avoid withdrawal symptoms. Withdrawal symptoms include:  Irritability or anger.  Anxiety or restlessness.  Difficulty sleeping.  Loss of appetite or weight.  Aches and pains.  Shakiness.  Sweating.  Chills. DIAGNOSIS Cannabis use disorder is diagnosed by your health care provider. You may be asked questions about your cannabis use and how it affects your life. A physical exam may be done. A drug screen may be done. You may be referred to a mental health professional. The diagnosis of cannabis use disorder requires at least two symptoms within 12 months. The type of cannabis use disorder you have depends on the number of symptoms you have. The type may be:  Mild. Two or three signs and symptoms.   Moderate. Four or   five signs and symptoms.   Severe. Six or more signs and symptoms.  TREATMENT Treatment is usually provided by mental health professionals with training in substance use disorders. The following options are available:  Counseling or talk therapy. Talk therapy addresses the reasons you use  cannabis. It also addresses ways to keep you from using again. The goals of talk therapy include:  Identifying and avoiding triggers for use.  Learning how to handle cravings.  Replacing use with healthy activities.  Support groups. Support groups provide emotional support, advice, and guidance.  Medicine. Medicine is used to treat mental health issues that trigger cannabis use or that result from it. HOME CARE INSTRUCTIONS  Take medicines only as directed by your health care provider.  Check with your health care provider before starting any new medicines.  Keep all follow-up visits as directed by your health care provider. SEEK MEDICAL CARE IF:  You are not able to take your medicines as directed.  Your symptoms get worse. SEEK IMMEDIATE MEDICAL CARE IF: You have serious thoughts about hurting yourself or others. FOR MORE INFORMATION  National Institute on Drug Abuse: www.drugabuse.gov  Substance Abuse and Mental Health Services Administration: www.samhsa.gov   This information is not intended to replace advice given to you by your health care provider. Make sure you discuss any questions you have with your health care provider.   Document Released: 05/13/2000 Document Revised: 06/06/2014 Document Reviewed: 05/29/2013 Elsevier Interactive Patient Education 2016 [employer]  

## 2015-10-22 NOTE — ED Notes (Signed)
Patient left at this time with all belongings. 

## 2015-10-23 LAB — URINE CULTURE

## 2016-04-27 ENCOUNTER — Emergency Department (HOSPITAL_COMMUNITY): Payer: Medicaid Other

## 2016-04-27 ENCOUNTER — Emergency Department (HOSPITAL_COMMUNITY)
Admission: EM | Admit: 2016-04-27 | Discharge: 2016-04-27 | Disposition: A | Discharge status: Home / Self Care | Payer: Medicaid Other | Attending: Emergency Medicine | Admitting: Emergency Medicine

## 2016-04-27 ENCOUNTER — Encounter (HOSPITAL_COMMUNITY): Payer: Self-pay | Admitting: *Deleted

## 2016-04-27 DIAGNOSIS — F17210 Nicotine dependence, cigarettes, uncomplicated: Secondary | ICD-10-CM | POA: Diagnosis not present

## 2016-04-27 DIAGNOSIS — Z79899 Other long term (current) drug therapy: Secondary | ICD-10-CM | POA: Diagnosis not present

## 2016-04-27 DIAGNOSIS — R05 Cough: Secondary | ICD-10-CM | POA: Diagnosis present

## 2016-04-27 DIAGNOSIS — Z8673 Personal history of transient ischemic attack (TIA), and cerebral infarction without residual deficits: Secondary | ICD-10-CM | POA: Insufficient documentation

## 2016-04-27 DIAGNOSIS — I10 Essential (primary) hypertension: Secondary | ICD-10-CM | POA: Diagnosis not present

## 2016-04-27 DIAGNOSIS — Z7982 Long term (current) use of aspirin: Secondary | ICD-10-CM | POA: Insufficient documentation

## 2016-04-27 DIAGNOSIS — J40 Bronchitis, not specified as acute or chronic: Secondary | ICD-10-CM

## 2016-04-27 LAB — BASIC METABOLIC PANEL
Anion gap: 9 (ref 5–15)
BUN: 8 mg/dL (ref 6–20)
CALCIUM: 9.5 mg/dL (ref 8.9–10.3)
CO2: 23 mmol/L (ref 22–32)
Chloride: 106 mmol/L (ref 101–111)
Creatinine, Ser: 1.02 mg/dL (ref 0.61–1.24)
GFR calc Af Amer: >60 mL/min (ref >60)
Glucose, Bld: 104 mg/dL — ABNORMAL HIGH (ref 65–99)
POTASSIUM: 3.9 mmol/L (ref 3.5–5.1)
SODIUM: 138 mmol/L (ref 135–145)

## 2016-04-27 LAB — CBC
HEMATOCRIT: 44.3 % (ref 39.0–52.0)
Hemoglobin: 15.9 g/dL (ref 13.0–17.0)
MCH: 33.1 pg (ref 26.0–34.0)
MCHC: 35.9 g/dL (ref 30.0–36.0)
MCV: 92.3 fL (ref 78.0–100.0)
PLATELETS: 238 10*3/uL (ref 150–400)
RBC: 4.8 MIL/uL (ref 4.22–5.81)
RDW: 14.1 % (ref 11.5–15.5)
WBC: 6.1 10*3/uL (ref 4.0–10.5)

## 2016-04-27 LAB — I-STAT TROPONIN, ED: TROPONIN I, POC: 0 ng/mL (ref 0.00–0.08)

## 2016-04-27 MED ORDER — ALBUTEROL SULFATE HFA 108 (90 BASE) MCG/ACT IN AERS
2.0000 | INHALATION_SPRAY | RESPIRATORY_TRACT | Status: DC | PRN
  Administered 2016-04-27: 2 via RESPIRATORY_TRACT
  Filled 2016-04-27: qty 6.7

## 2016-04-27 MED ORDER — BENZONATATE 100 MG PO CAPS: 100.0000 mg | ORAL_CAPSULE | Freq: Three times a day (TID) | ORAL | 0 refills | Status: DC

## 2016-04-27 MED ORDER — AZITHROMYCIN 250 MG PO TABS: 250.0000 mg | ORAL_TABLET | Freq: Every day | ORAL | 0 refills | Status: DC

## 2016-04-27 MED ORDER — PREDNISONE 20 MG PO TABS: ORAL_TABLET | ORAL | 0 refills | Status: DC

## 2016-04-27 NOTE — ED Triage Notes (Signed)
Pt has multiple complaints. Reports productive cough, chest pain, left leg weakness x 2 days. ekg done at triage. No acute distress noted.

## 2016-04-27 NOTE — ED Notes (Signed)
ED Provider at bedside. 

## 2016-04-27 NOTE — ED Provider Notes (Signed)
[facility]-EMERGENCY DEPT Provider Note   CSN: [id number] Arrival date & time: 04/27/16  0800     History   Chief Complaint Chief Complaint  Patient presents with  . Chest Pain  . Weakness  . Cough    HPI Craig Holden is a 60 y.o. male.  HPI   60 year old male with history of hypertension, prior stroke currently on Plavix, depression, presenting with complaints of chest pain and generalized weakness. Patient states for the past 2 weeks he has had a constant nonproductive cough with associatedchills, generalized fatigue, nausea, early satiety and weight loss. Last night he developed chest pain. Pain is increasing with coughing, rated as 4 out of 10, non radiating, nothing seems to make it better but no specific treatment tried.Pain has since resolved. States increasing pain when he lays on his left side. He admits to recent sick contact as well as having increased stress at work and at home.  He admits to a 50-pack-year smoking history. Denies any prior MI. Denies any exertional chest pain, lightheadedness, dizziness, nausea, diaphoresis associated with chest pain. He is not on any ACE inhibitor. He has history of bronchitis and states that this felt similar. No history of cancer. Denies any shortness of breath and no recent surgery, prolonged bed rest, unilateral leg swelling or calf pain, active cancer or hemoptysis.    Past Medical History:  Diagnosis Date  . CVA (cerebral infarction)   . Depression   . Hypertension   . Nerve damage   . PTSD (post-traumatic stress disorder)     Patient Active Problem List   Diagnosis Date Noted  . Occlusion of left internal carotid artery, s/p revascularization 04/21/2015  . 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 HLD (hyperlipidemia)   . Tobacco use disorder   . Marijuana abuse   . Preoperative evaluation to rule out surgical contraindication 01/01/2014  . Essential hypertension 01/01/2014  . History of stroke  01/01/2014    Past Surgical History:  Procedure Laterality Date  . ANTERIOR CERVICAL DECOMPRESSION/DISCECTOMY FUSION 4 LEVELS  01/20/2015   C4-C7  . BACK SURGERY    . HEMORROIDECTOMY    . HIP SURGERY    . RADIOLOGY WITH ANESTHESIA N/A 04/18/2015   Procedure: RADIOLOGY WITH ANESTHESIA;  Surgeon: Julieanne CottonSanjeev Deveshwar, MD;  Location: [facility] OR;  Service: Radiology;  Laterality: N/A;       Home Medications    Prior to Admission medications   Medication Sig Start Date End Date Taking? Authorizing Provider  albuterol (PROVENTIL HFA;VENTOLIN HFA) 108 (90 BASE) MCG/ACT inhaler Inhale 1-2 puffs into the lungs every 6 (six) hours as needed for wheezing. 06/12/11   April Palumbo, MD  aspirin 325 MG tablet Take 1 tablet (325 mg total) by mouth daily with breakfast. 04/21/15   Layne BentonSharon L Biby, NP  atorvastatin (LIPITOR) 80 MG tablet Take 1 tablet (80 mg total) by mouth daily at 6 PM. 04/21/15   Layne BentonSharon L Biby, NP  clopidogrel (PLAVIX) 75 MG tablet Take 1 tablet (75 mg total) by mouth daily with breakfast. 04/21/15   Layne BentonSharon L Biby, NP  gabapentin (NEURONTIN) 800 MG tablet Take 800 mg by mouth 3 (three) times daily.    Historical Provider, MD  hydrochlorothiazide (HYDRODIURIL) 25 MG tablet Take 12.5 mg by mouth daily.     Historical Provider, MD  losartan (COZAAR) 100 MG tablet Take 50 mg by mouth daily.    Historical Provider, MD  metroNIDAZOLE (FLAGYL) 500 MG tablet  Take 1 tablet (500 mg total) by mouth 2 (two) times daily. 10/22/15   Sofie RowerMike Stengel, MD  nicotine (RA NICOTINE) 21 mg/24hr patch Place 1 patch onto the skin daily.    Historical Provider, MD  sildenafil (VIAGRA) 100 MG tablet Take 100 mg by mouth daily as needed for erectile dysfunction.    Historical Provider, MD    Family History Family History  Problem Relation Age of Onset  . Heart attack Father   . Emphysema Father   . Hypertension Mother   . Diabetes Mother     Social History Social History  Substance Use Topics  . Smoking status:  Current Every Day Smoker    Packs/day: 1.00    Types: Cigarettes  . Smokeless tobacco: Not on file  . Alcohol use No     Allergies   Morphine sulfate   Review of Systems Review of Systems  All other systems reviewed and are negative.    Physical Exam Updated Vital Signs BP 157/92   Pulse 62   Temp 98.4 F (36.9 C) (Oral)   Resp 17   Ht 6\' 2"  (1.88 m)   SpO2 100%   Physical Exam  Constitutional: He appears well-developed and well-nourished. No distress.  HENT:  Head: Atraumatic.  Right Ear: External ear normal.  Left Ear: External ear normal.  Mouth/Throat: Oropharynx is clear and moist.  Eyes: Conjunctivae are normal.  Neck: Normal range of motion. Neck supple.  No JVD, no nuchal rigidity  Cardiovascular: Normal rate and regular rhythm.   Pulmonary/Chest:  Faint expiratory wheezes, and scattered rhonchi heard  Abdominal: Soft. There is no tenderness.  Musculoskeletal: He exhibits no edema.  Neurological: He is alert.  Skin: Capillary refill takes less than 2 seconds. No rash noted.  Psychiatric: He has a normal mood and affect.  Nursing note and vitals reviewed.    ED Treatments / Results  Labs (all labs ordered are listed, but only abnormal results are displayed) Labs Reviewed  BASIC METABOLIC PANEL - Abnormal; Notable for the following:       Result Value   Glucose, Bld 104 (*)    All other components within normal limits  CBC  I-STAT TROPOININ, ED    EKG  EKG Interpretation  Date/Time:  Wednesday April 27 2016 08:09:19 EST Ventricular Rate:  73 PR Interval:  172 QRS Duration: 78 QT Interval:  366 QTC Calculation: 403 R Axis:   73 Text Interpretation:  Normal sinus rhythm Septal infarct , age undetermined Abnormal ECG no change from previous. Confirmed by Donnald GarrePfeiffer, MD, Lebron ConnersMarcy [phone]) on 04/27/2016 11:32:06 AM       Radiology Dg Chest 2 View  Result Date: 04/27/2016 CLINICAL DATA:  Chest pain. EXAM: CHEST  2 VIEW COMPARISON:   Radiographs of Oct 21, 2015. FINDINGS: The heart size and mediastinal contours are within normal limits. Both lungs are clear. No pneumothorax or pleural effusion is noted. The visualized skeletal structures are unremarkable. IMPRESSION: No active cardiopulmonary disease. Electronically Signed   By: Lupita RaiderJames  Green Jr, M.D.   On: 04/27/2016 08:42    Procedures Procedures (including critical care time)  Medications Ordered in ED Medications  albuterol (PROVENTIL HFA;VENTOLIN HFA) 108 (90 Base) MCG/ACT inhaler 2 puff (not administered)     Initial Impression / Assessment and Plan / ED Course  I have reviewed the triage vital signs and the nursing notes.  Pertinent labs & imaging results that were available during my care of the patient were reviewed by me and  considered in my medical decision making (see chart for details).  Clinical Course     BP 157/92   Pulse 62   Temp 98.4 F (36.9 C) (Oral)   Resp 17   Ht 6\' 2"  (1.88 m)   SpO2 100%    Final Clinical Impressions(s) / ED Diagnoses   Final diagnoses:  Bronchitis    New Prescriptions New Prescriptions   AZITHROMYCIN (ZITHROMAX) 250 MG TABLET    Take 1 tablet (250 mg total) by mouth daily.   BENZONATATE (TESSALON) 100 MG CAPSULE    Take 1 capsule (100 mg total) by mouth every 8 (eight) hours.   PREDNISONE (DELTASONE) 20 MG TABLET    2 tabs po daily x 4 days   11:26 AM Patient presents with symptoms suggestive of bronchitis. Pain is atypical for ACS. He does not have any active chest pain at this time. EKG without acute ischemic changes. Chest x-ray is unremarkable. Normal troponin labs are reassuring. He does have scattered rhonchi and expiratory wheezes. Will provide steroid, albuterol inhaler, cough suppressant medication. Given the prolonged duration of his symptoms, we will treat with Z-Pak. He is scheduled to be seen by his PCP shortly for close follow-up. Return precaution discussed.  Care discussed with DR. Pfeiffer.      Fayrene Helper, PA-C 04/27/16 1137    Arby Barrette, MD 04/29/16 [id number]

## 2016-04-27 NOTE — Discharge Instructions (Signed)
Please use albuterol inhaler 2 puffs every 4 hours as needed for wheezing or shortness of breath.  Take Zpak for the full duration.  Take tessalon as needed for cough.  Take steroid for the next 4 days.  Follow up with your doctor for further care.

## 2016-07-02 ENCOUNTER — Emergency Department (HOSPITAL_COMMUNITY): Payer: Medicaid Other

## 2016-07-02 ENCOUNTER — Emergency Department (HOSPITAL_COMMUNITY)
Admission: EM | Admit: 2016-07-02 | Discharge: 2016-07-02 | Disposition: A | Discharge status: Home / Self Care | Payer: Medicaid Other | Attending: Emergency Medicine | Admitting: Emergency Medicine

## 2016-07-02 ENCOUNTER — Encounter (HOSPITAL_COMMUNITY): Payer: Self-pay | Admitting: Nurse Practitioner

## 2016-07-02 DIAGNOSIS — R197 Diarrhea, unspecified: Secondary | ICD-10-CM | POA: Diagnosis present

## 2016-07-02 DIAGNOSIS — Z79899 Other long term (current) drug therapy: Secondary | ICD-10-CM | POA: Diagnosis not present

## 2016-07-02 DIAGNOSIS — Z8673 Personal history of transient ischemic attack (TIA), and cerebral infarction without residual deficits: Secondary | ICD-10-CM | POA: Insufficient documentation

## 2016-07-02 DIAGNOSIS — K529 Noninfective gastroenteritis and colitis, unspecified: Secondary | ICD-10-CM | POA: Diagnosis not present

## 2016-07-02 DIAGNOSIS — R1032 Left lower quadrant pain: Secondary | ICD-10-CM | POA: Diagnosis not present

## 2016-07-02 DIAGNOSIS — F17210 Nicotine dependence, cigarettes, uncomplicated: Secondary | ICD-10-CM | POA: Insufficient documentation

## 2016-07-02 DIAGNOSIS — I10 Essential (primary) hypertension: Secondary | ICD-10-CM | POA: Insufficient documentation

## 2016-07-02 DIAGNOSIS — Z7982 Long term (current) use of aspirin: Secondary | ICD-10-CM | POA: Insufficient documentation

## 2016-07-02 LAB — CBC
HCT: 42.9 % (ref 39.0–52.0)
HEMOGLOBIN: 15 g/dL (ref 13.0–17.0)
MCH: 32.3 pg (ref 26.0–34.0)
MCHC: 35 g/dL (ref 30.0–36.0)
MCV: 92.5 fL (ref 78.0–100.0)
Platelets: 187 10*3/uL (ref 150–400)
RBC: 4.64 MIL/uL (ref 4.22–5.81)
RDW: 14.3 % (ref 11.5–15.5)
WBC: 7.3 10*3/uL (ref 4.0–10.5)

## 2016-07-02 LAB — URINALYSIS, ROUTINE W REFLEX MICROSCOPIC
BILIRUBIN URINE: NEGATIVE
Bacteria, UA: NONE SEEN
Glucose, UA: NEGATIVE mg/dL
Hgb urine dipstick: NEGATIVE
Ketones, ur: NEGATIVE mg/dL
NITRITE: NEGATIVE
PROTEIN: NEGATIVE mg/dL
Specific Gravity, Urine: >1.046 — ABNORMAL HIGH (ref 1.005–1.030)
pH: 5 (ref 5.0–8.0)

## 2016-07-02 LAB — COMPREHENSIVE METABOLIC PANEL
ALBUMIN: 3.8 g/dL (ref 3.5–5.0)
ALT: 8 U/L — ABNORMAL LOW (ref 17–63)
ANION GAP: 11 (ref 5–15)
AST: 16 U/L (ref 15–41)
Alkaline Phosphatase: 94 U/L (ref 38–126)
BILIRUBIN TOTAL: 0.8 mg/dL (ref 0.3–1.2)
BUN: 9 mg/dL (ref 6–20)
CO2: 23 mmol/L (ref 22–32)
Calcium: 9.2 mg/dL (ref 8.9–10.3)
Chloride: 101 mmol/L (ref 101–111)
Creatinine, Ser: 1.3 mg/dL — ABNORMAL HIGH (ref 0.61–1.24)
GFR calc Af Amer: >60 mL/min (ref >60)
GFR, EST NON AFRICAN AMERICAN: 57 mL/min — AB (ref >60)
GLUCOSE: 98 mg/dL (ref 65–99)
POTASSIUM: 3.5 mmol/L (ref 3.5–5.1)
Sodium: 135 mmol/L (ref 135–145)
TOTAL PROTEIN: 6.9 g/dL (ref 6.5–8.1)

## 2016-07-02 MED ORDER — ONDANSETRON 4 MG PO TBDP: 4.0000 mg | ORAL_TABLET | Freq: Three times a day (TID) | ORAL | 0 refills | Status: DC | PRN

## 2016-07-02 MED ORDER — ACETAMINOPHEN 500 MG PO TABS
1000.0000 mg | ORAL_TABLET | Freq: Once | ORAL | Status: AC
  Administered 2016-07-02: 1000 mg via ORAL
  Filled 2016-07-02: qty 2

## 2016-07-02 MED ORDER — SODIUM CHLORIDE 0.9 % IV BOLUS (SEPSIS)
1000.0000 mL | Freq: Once | INTRAVENOUS | Status: AC
  Administered 2016-07-02: 1000 mL via INTRAVENOUS

## 2016-07-02 MED ORDER — ONDANSETRON HCL 4 MG/2ML IJ SOLN
4.0000 mg | Freq: Once | INTRAMUSCULAR | Status: AC
  Administered 2016-07-02: 4 mg via INTRAVENOUS
  Filled 2016-07-02: qty 2

## 2016-07-02 MED ORDER — IOPAMIDOL (ISOVUE-300) INJECTION 61%
INTRAVENOUS | Status: AC
  Administered 2016-07-02: 100 mL
  Filled 2016-07-02: qty 100

## 2016-07-02 NOTE — ED Notes (Signed)
Patient transported to CT 

## 2016-07-02 NOTE — ED Notes (Signed)
Md aware of decrease in BP

## 2016-07-02 NOTE — ED Triage Notes (Signed)
Pt presents with c/o GI problem. The symptoms started this morning. He c/o fevers, weakness, abd pain, vomiting, diarrhea. He has been unable to tolerate any oral intake.

## 2016-07-02 NOTE — ED Notes (Addendum)
Patient asked for urine sample. He states that he is unable to give sample at this time 

## 2016-07-02 NOTE — ED Provider Notes (Signed)
[facility] DEPT Provider Note   CSN: [id number] Arrival date & time: 07/02/16  1644     History   Chief Complaint Chief Complaint  Patient presents with  . GI Problem    HPI Craig Holden is a 61 y.o. male.  HPI  The patient is a 61 year old male, he has a history of stroke, posterior medics stress disorder, hypertension and depression, states that he has daily pain chronically but over the last 12-24 hours has developed a fever with body aches, muscle aches, vomiting and diarrhea. He reports 2 episodes of watery brown diarrhea. There is no blood. He has been coughing for months and states that this has not changed. His daughter has been sick this week home from school with a cough but no fevers. The patient denies any difficulty urinating, he has been able to tolerate his medications but has had no food or water today and feels dehydrated. He does report some abdominal cramping located in the periumbilical suprapubic and left lower quadrant area which is mild and intermittent throughout the day as well.  No travel.  Past Medical History:  Diagnosis Date  . CVA (cerebral infarction)   . Depression   . Hypertension   . Nerve damage   . PTSD (post-traumatic stress disorder)     Patient Active Problem List   Diagnosis Date Noted  . Occlusion of left internal carotid artery, s/p revascularization 04/21/2015  . 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 HLD (hyperlipidemia)   . Tobacco use disorder   . Marijuana abuse   . Preoperative evaluation to rule out surgical contraindication 01/01/2014  . Essential hypertension 01/01/2014  . History of stroke 01/01/2014    Past Surgical History:  Procedure Laterality Date  . ANTERIOR CERVICAL DECOMPRESSION/DISCECTOMY FUSION 4 LEVELS  01/20/2015   C4-C7  . BACK SURGERY    . HEMORROIDECTOMY    . HIP SURGERY    . RADIOLOGY WITH ANESTHESIA N/A 04/18/2015   Procedure: RADIOLOGY WITH ANESTHESIA;  Surgeon:  Julieanne Cotton, MD;  Location: [facility] OR;  Service: Radiology;  Laterality: N/A;      Home Medications    Prior to Admission medications   Medication Sig Start Date End Date Taking? Authorizing Provider  albuterol (PROVENTIL HFA;VENTOLIN HFA) 108 (90 BASE) MCG/ACT inhaler Inhale 1-2 puffs into the lungs every 6 (six) hours as needed for wheezing. 06/12/11  Yes April Palumbo, MD  atorvastatin (LIPITOR) 80 MG tablet Take 1 tablet (80 mg total) by mouth daily at 6 PM. 04/21/15  Yes Layne Benton, NP  clopidogrel (PLAVIX) 75 MG tablet Take 1 tablet (75 mg total) by mouth daily with breakfast. 04/21/15  Yes Layne Benton, NP  gabapentin (NEURONTIN) 800 MG tablet Take 800 mg by mouth 3 (three) times daily.   Yes Historical Provider, MD  hydrochlorothiazide (HYDRODIURIL) 25 MG tablet Take 12.5 mg by mouth daily.    Yes Historical Provider, MD  losartan (COZAAR) 100 MG tablet Take 50 mg by mouth daily.   Yes Historical Provider, MD  sildenafil (VIAGRA) 100 MG tablet Take 100 mg by mouth daily as needed for erectile dysfunction.   Yes Historical Provider, MD  aspirin 325 MG tablet Take 1 tablet (325 mg total) by mouth daily with breakfast. Patient not taking: Reported on 04/27/2016 04/21/15   Layne Benton, NP  ondansetron (ZOFRAN ODT) 4 MG disintegrating tablet Take 1 tablet (4 mg total) by mouth every 8 (eight) hours as needed  for nausea. 07/02/16   Eber Hong, MD    Family History Family History  Problem Relation Age of Onset  . Heart attack Father   . Emphysema Father   . Hypertension Mother   . Diabetes Mother     Social History Social History  Substance Use Topics  . Smoking status: Current Every Day Smoker    Packs/day: 1.00    Types: Cigarettes  . Smokeless tobacco: Never Used  . Alcohol use No     Allergies   Morphine sulfate   Review of Systems Review of Systems  All other systems reviewed and are negative.    Physical Exam Updated Vital Signs BP 114/57   Pulse 83    Temp 98.6 F (37 C) (Oral)   Resp 21   SpO2 98%   Physical Exam  Constitutional: He appears well-developed and well-nourished. No distress.  HENT:  Head: Normocephalic and atraumatic.  Mouth/Throat: Oropharynx is clear and moist. No oropharyngeal exudate.  Eyes: Conjunctivae and EOM are normal. Pupils are equal, round, and reactive to light. Right eye exhibits no discharge. Left eye exhibits no discharge. No scleral icterus.  Neck: Normal range of motion. Neck supple. No JVD present. No thyromegaly present.  Cardiovascular: Normal rate, regular rhythm, normal heart sounds and intact distal pulses.  Exam reveals no gallop and no friction rub.   No murmur heard. Pulmonary/Chest: Effort normal and breath sounds normal. No respiratory distress. He has no wheezes. He has no rales.  Abdominal: Soft. He exhibits no distension and no mass. There is tenderness ( mild LLQ ttp, no guarding, increased BS).  Musculoskeletal: Normal range of motion. He exhibits no edema or tenderness.  Lymphadenopathy:    He has no cervical adenopathy.  Neurological: He is alert. Coordination normal.  Skin: Skin is warm and dry. No rash noted. No erythema.  Psychiatric: He has a normal mood and affect. His behavior is normal.  Nursing note and vitals reviewed.    ED Treatments / Results  Labs (all labs ordered are listed, but only abnormal results are displayed) Labs Reviewed  COMPREHENSIVE METABOLIC PANEL - Abnormal; Notable for the following:       Result Value   Creatinine, Ser 1.30 (*)    ALT 8 (*)    GFR calc non Af Amer 57 (*)    All other components within normal limits  CBC  URINALYSIS, ROUTINE W REFLEX MICROSCOPIC    EKG  EKG Interpretation None       Radiology Ct Abdomen Pelvis W Contrast  Result Date: 07/02/2016 CLINICAL DATA:  Left lower quadrant abdominal pain today. EXAM: CT ABDOMEN AND PELVIS WITH CONTRAST TECHNIQUE: Multidetector CT imaging of the abdomen and pelvis was performed  using the standard protocol following bolus administration of intravenous contrast. CONTRAST:  ISOVUE-300 IOPAMIDOL (ISOVUE-300) INJECTION 61% COMPARISON:  None. FINDINGS: Lower chest: No acute abnormality. Hepatobiliary: Numerous low-attenuation lesions scattered throughout the liver, conclusively characterized. These may represent benign cysts. Gallbladder and bile ducts are unremarkable. Pancreas: Unremarkable. No pancreatic ductal dilatation or surrounding inflammatory changes. Spleen: Normal in size without focal abnormality. Adrenals/Urinary Tract: Adrenal glands are unremarkable. Kidneys are normal, without renal calculi, focal lesion, or hydronephrosis. Bladder is unremarkable. Stomach/Bowel: Stomach is unremarkable. Generous volume of fluid throughout small bowel. No bowel obstruction. No extraluminal air. No inflammation of bowel. Vascular/Lymphatic: The abdominal aorta is normal in caliber with moderate atherosclerotic calcification. 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 Reproductive: Unremarkable Other: No ascites. No  focal inflammatory changes in the abdomen or pelvis. Musculoskeletal: No significant skeletal lesion. There is a unilateral L5 spondylolysis on the right. IMPRESSION: 1. Mildly prominent intraluminal fluid throughout small bowel, without focal inflammation or obstruction. This is nonspecific but could represent enteritis. 2. Numerous indeterminate low-attenuation lesions scattered throughout the liver. Although these may be benign, they cannot be characterized. MRI without with contrast would be optimal for characterization. 3. 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 4. 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 5. These results will be called to the ordering clinician or representative by the Radiologist Assistant, and communication documented in the PACS or zVision Dashboard. Electronically Signed   By: Ellery Plunkaniel R Mitchell M.D.   On: 07/02/2016 20:57     Procedures Procedures (including critical care time)  Medications Ordered in ED Medications  acetaminophen (TYLENOL) tablet 1,000 mg (1,000 mg Oral Given 07/02/16 1931)  ondansetron (ZOFRAN) injection 4 mg (4 mg Intravenous Given 07/02/16 1930)  sodium chloride 0.9 % bolus 1,000 mL (0 mLs Intravenous Stopped 07/02/16 2030)  iopamidol (ISOVUE-300) 61 % injection (100 mLs  Contrast Given 07/02/16 2011)  sodium chloride 0.9 % bolus 1,000 mL (0 mLs Intravenous Stopped 07/02/16 2208)     Initial Impression / Assessment and Plan / ED Course  I have reviewed the triage vital signs and the nursing notes.  Pertinent labs & imaging results that were available during my care of the patient were reviewed by me and considered in my medical decision making (see chart for details).  The patient is not ill-appearing, at this time his vital signs are totally normal including oxygen of 96%, blood pressure 120/82, pulse of 95, temperature is elevated at 100.4. He'll be given Tylenol, fluids, Zofran, basic labs have been evaluated with normal renal and liver function other than a creatinine of 1.3. This seems to be slightly elevated from prior value 2 months ago when it was 1.0. We'll obtain urinalysis, hydrate, reevaluate. He does not have any significant abdominal tenderness that would make me think about more severe problems however if he continues to have left lower quadrant pain we may need to evaluate for diverticulitis given his abdominal discomfort with fever and nausea.  The pt states he is feeling better after fluids and antiemetics.  CT shows some enteritis - stable for d/c.  No hypotension or tachycardia here. Pt informed of all results - in agreement for d/c.  Final Clinical Impressions(s) / ED Diagnoses   Final diagnoses:  Gastroenteritis    New Prescriptions New Prescriptions   ONDANSETRON (ZOFRAN ODT) 4 MG DISINTEGRATING TABLET    Take 1 tablet (4 mg total) by mouth every 8 (eight) hours as  needed for nausea.     Eber HongBrian Jassen Sarver, MD 07/02/16 2218

## 2016-07-02 NOTE — ED Notes (Signed)
Provided patient with warm blanket.  Updated on delay.

## 2016-07-02 NOTE — Discharge Instructions (Signed)
Zofran for nausea Drink plenty of fluids  Your testing shows that you have diarrhea and will likely continue for the next few days.   Return for severe abdominal pain / persistent vomiting

## 2016-11-13 IMAGING — CT CT HEAD W/O CM
2 series · 16 of 30 positions shown, 20 images · non-contrast
Comparison: 04/11/2013

CLINICAL DATA: Acute onset right-sided weakness, slurred speech
acute onset right-sided weakness, slurred speech, facial droop, and
confusion.

EXAM:
CT HEAD WITHOUT CONTRAST
TECHNIQUE: Contiguous axial images were obtained from the base of the skull
through the vertex without intravenous contrast.

[Series 201: head w/o, idose (1) · axial · non-contrast · 0.49mm/px · z∈[+96,+231]mm · 13 of 33 slices shown, 17 images]
[im 3/33  brain]
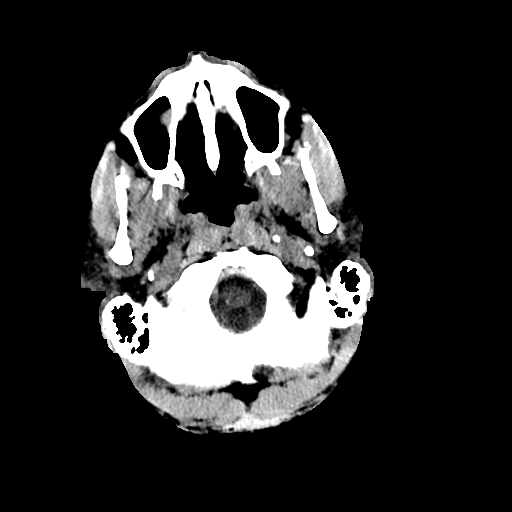
[im 3/33  bone]
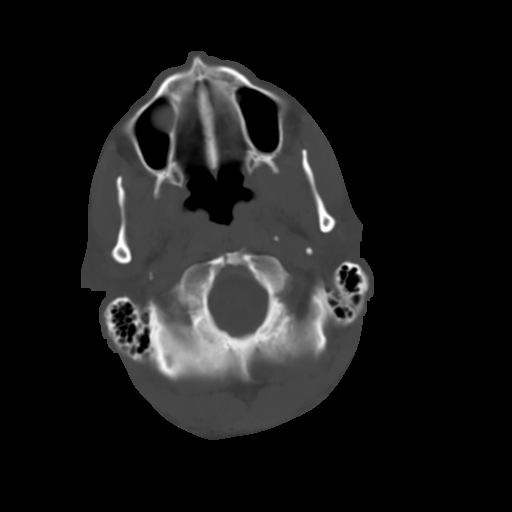
[im 5/33  brain]
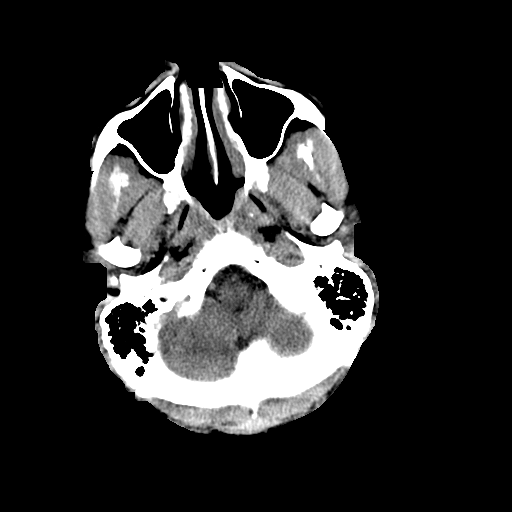
[im 7/33  brain]
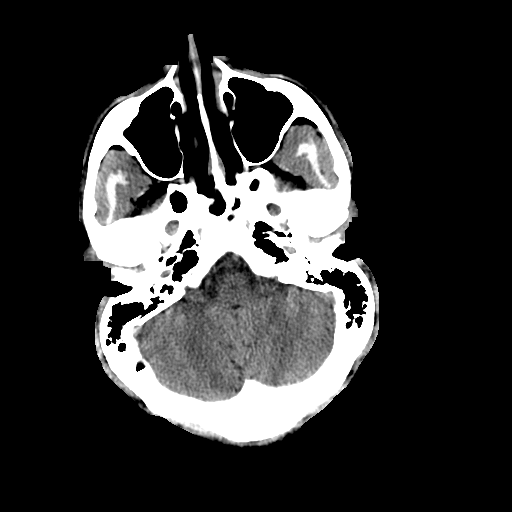
[im 10/33  brain]
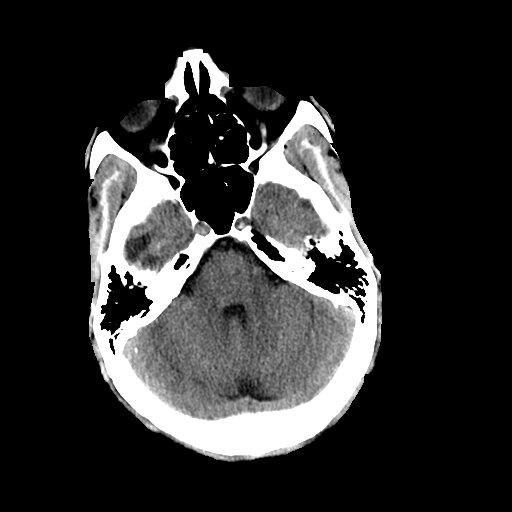
[im 12/33  brain]
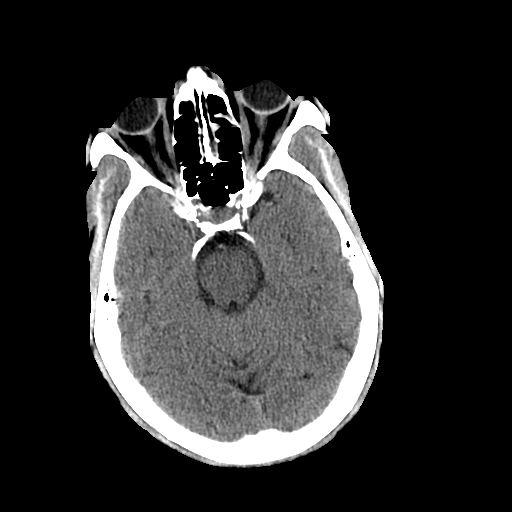
[im 12/33  bone]
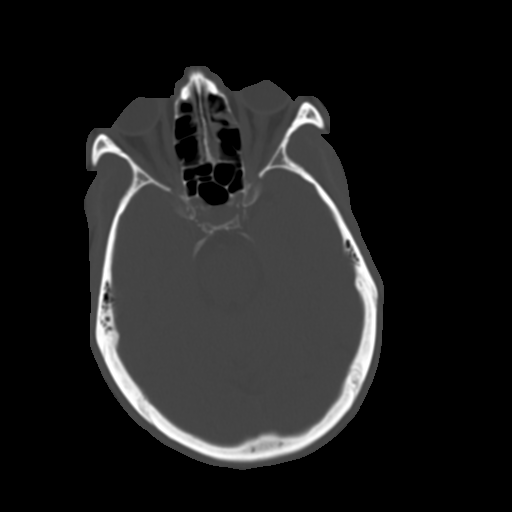
[im 14/33  brain]
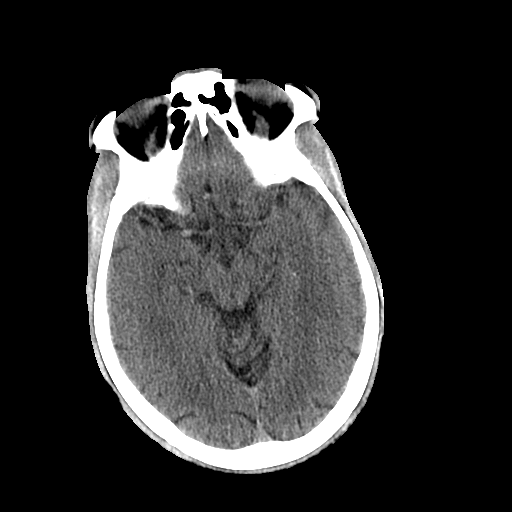
[im 17/33  brain]
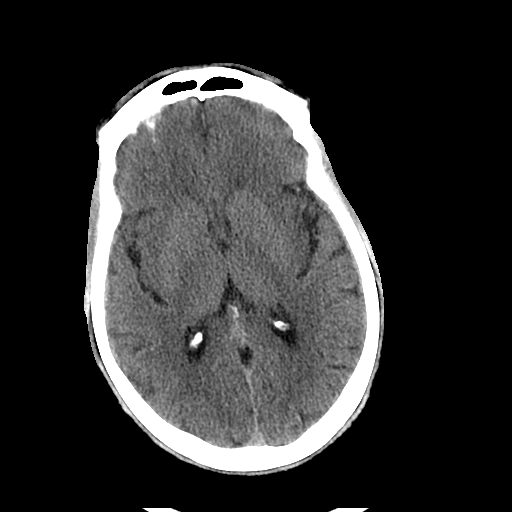
[im 19/33  brain]
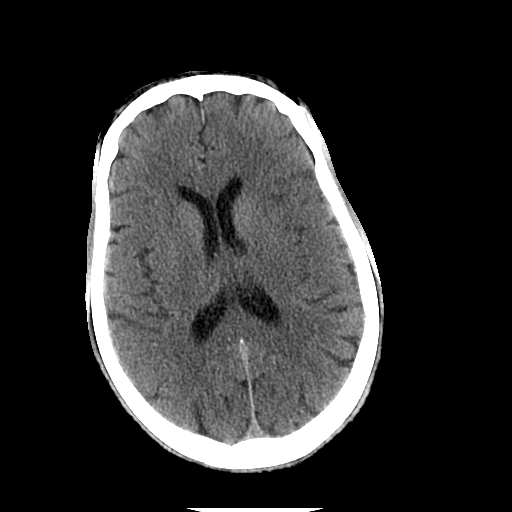
[im 21/33  brain]
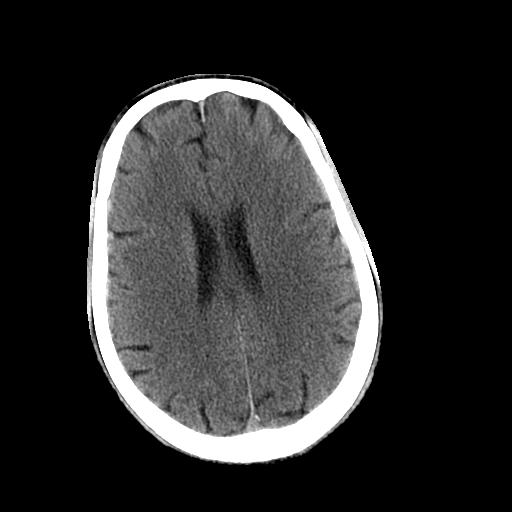
[im 21/33  bone]
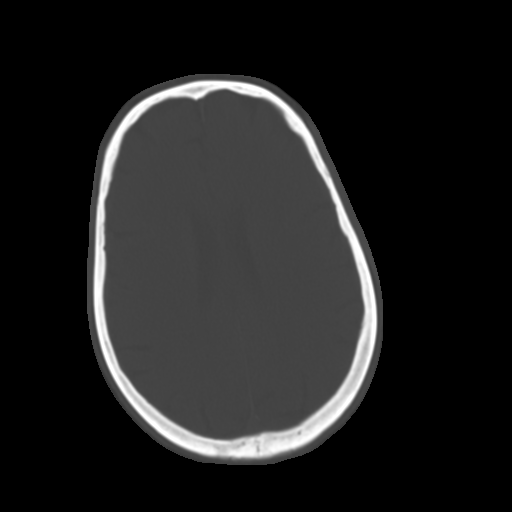
[im 23/33  brain]
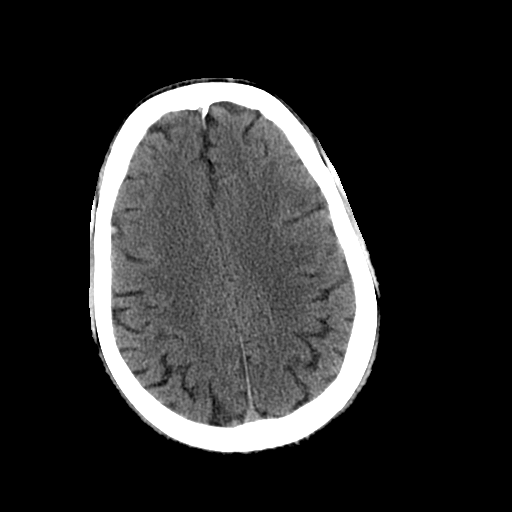
[im 26/33  brain]
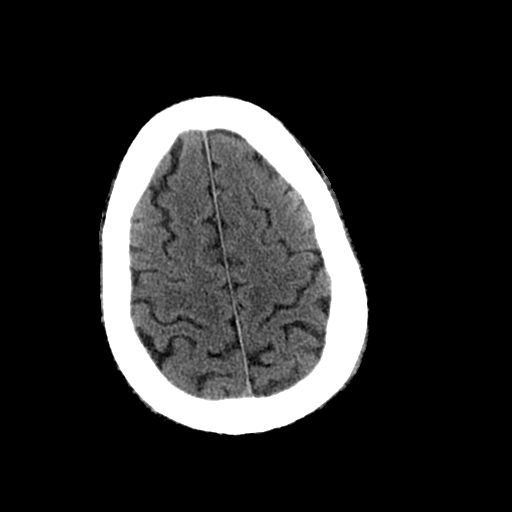
[im 28/33  brain]
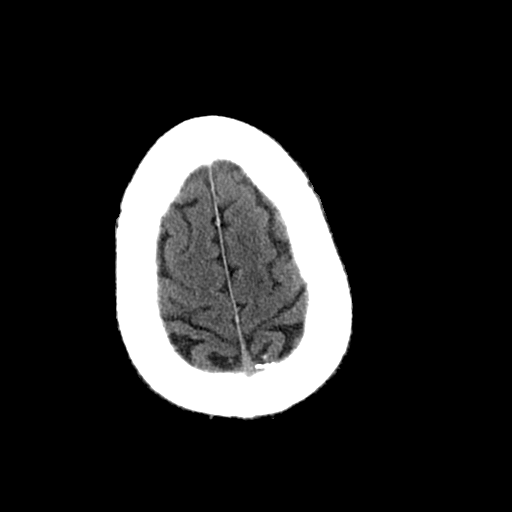
[im 30/33  brain]
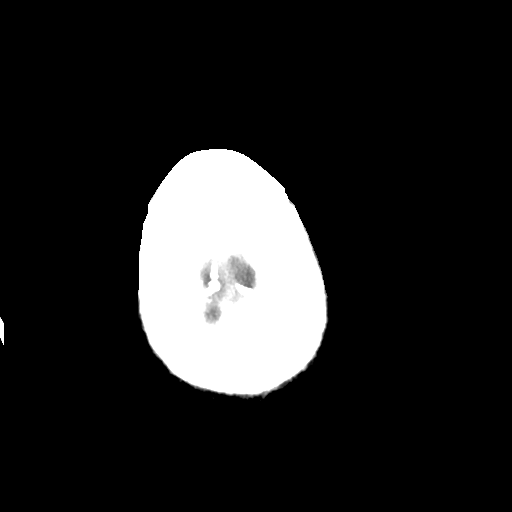
[im 30/33  bone]
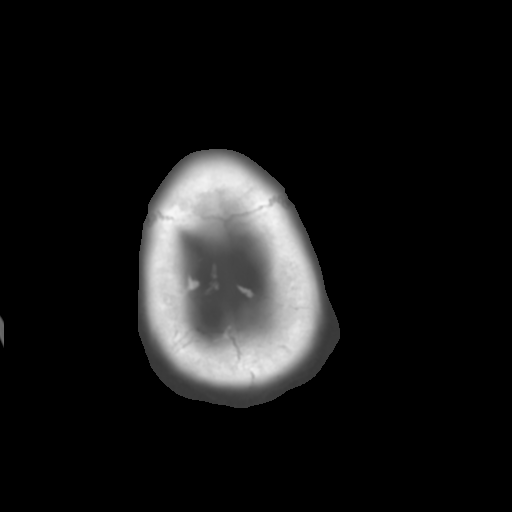

[Series 202: head w/o bone, idose (1) · axial · non-contrast · 0.49mm/px · z∈[+96,+141]mm · 3 of 33 slices shown]
[im 3/33  bone]
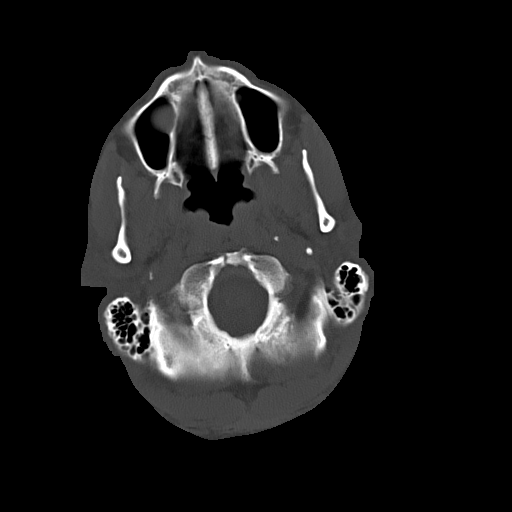
[im 7/33  bone]
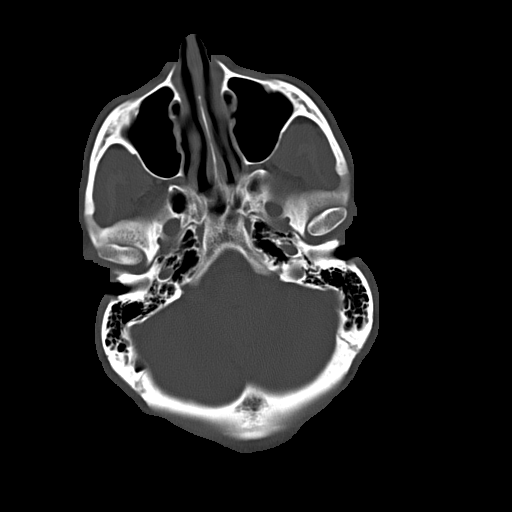
[im 12/33  bone]
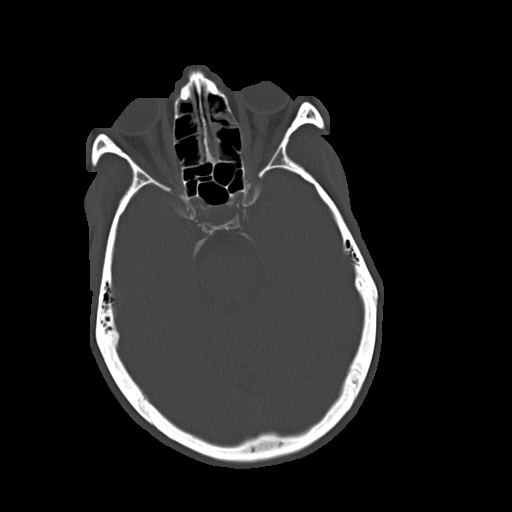

[16 of 30 positions shown; findings below may reference images not displayed]

FINDINGS: Brain: No evidence of acute infarction, hemorrhage, extra-axial
collection, ventriculomegaly, or mass effect.

Vascular: No hyperdense vessel or unexpected calcification.

Skull: Negative for fracture or focal lesion.

Sinuses/Orbits: No acute findings.

Other: None.
IMPRESSION: Negative noncontrast head CT.

These results were called by telephone at the time of interpretation
on 04/18/2015 at [DATE] to Dr. Schwendener, who verbally acknowledged
these results.

## 2016-11-13 IMAGING — CT CT ANGIO NECK
1 series · 1 of 2 positions shown · IV contrast (omnipaque)
Comparison: CT head April 18, 2015 at 7403 hours

CLINICAL DATA: Slurred speech and questionable facial droop,
RIGHT-sided weakness beginning at [DATE] p.m. today. History of stroke
in 4034 due to RIGHT anterior temporal at AV malformation. Known 30%
stenosis LEFT M1. History of hypertension and nerve damage.

EXAM:
CT PERFUSION HEAD WITH CONTRAST
CT ANGIOGRAPHY OF THE HEAD AND NECK
TECHNIQUE: Multidetector CT imaging of the head and neck was performed using
the standard protocol during bolus administration of intravenous
contrast. Multiplanar CT image reconstructions and MIPs were
obtained to evaluate the vascular anatomy. Carotid stenosis
measurements (when applicable) are obtained utilizing NASCET
criteria, using the distal internal carotid diameter as the
denominator. CT for fusion with post processed off line imaging.
CONTRAST:  100mL OMNIPAQUE IOHEXOL 350 MG/ML SOLN

[Series 100: scout · coronal · 0.6mm · 0.98mm/px · 1 of 2 slices shown]
[im 2/2]
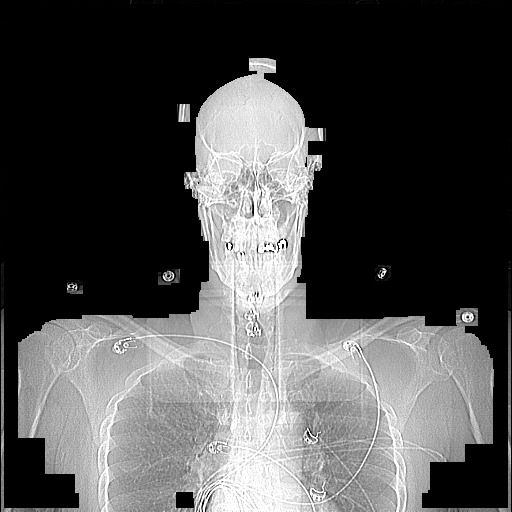

[1 of 2 positions shown; findings below may reference images not displayed]

FINDINGS: CT PERFUSION

Increased mean transit time LEFT frontal lobe and anterior LEFT
parietal lobe, with maintained cerebral blood volume compatible with
number/ischemia in the LEFT middle cerebral artery territory.

CTA NECK

Normal appearance of the thoracic arch, normal branch pattern. The
origins of the innominate, left Common carotid artery and subclavian
artery are widely patent. Eccentric calcific atherosclerosis of the
LEFT subclavian artery origin.

Bilateral Common carotid arteries are widely patent, coursing in a
straight line fashion. Eccentric calcific atherosclerosis in intimal
thickening of the RIGHT carotid bulb, without hemodynamically
significant stenosis. Expanded low-density thrombus fluid within the
LEFT internal carotid artery from the bifurcation cephalad,
completely excluded.

RIGHT vertebral artery is dominant. Normal appearance of the
vertebral arteries, which appear widely patent. Extrinsic deformity
due to degenerative cervical spine.

No dissection, no pseudoaneurysm. No abnormal luminal irregularity.
No contrast extravasation.

Soft tissues are unremarkable. Straightened cervical lordosis,
status post C4-5, C5-6 and C6-7 ACDF, non incorporated fusion
material. Uncovertebral hypertrophy resulting severe C5-6 and C6-7
neural foraminal narrowing. Congenital canal stenosis with
multilevel moderate neural canal to severe canal stenosis.

CTA HEAD

Anterior circulation: Widely patent RIGHT cervical internal carotid
arteries, petrous, cavernous and supra clinoid internal carotid
arteries. Less than 2 mm blister aneurysm proximal RIGHT cavernous
internal carotid artery. The LEFT internal carotid artery is
occluded to the level of the cavernous carotid were there is
thready, presumably retrograde flow. Widely patent anterior
communicating artery. Normal appearance of the anterior cerebral
arteries. Moderate stenosis LEFT M1 segment. Occluded LEFT M2
segment. Occluded LEFT M 3 segment.

Posterior circulation: Normal appearance of the vertebral arteries,
vertebrobasilar junction and basilar artery, as well as main branch
vessels. Robust bilateral posterior communicating arteries present.
Normal appearance of the posterior cerebral arteries.

RIGHT inferior temporal lobe arterial venous malformation, from with
enlarged contribution from RIGHT middle meningeal artery, multiple
prominent vessels with 5 mm aneurysmal contribution. Drainage to the
RIGHT vein of Labbe better characterized on prior catheter
angiogram, narrowing of the distal cortical vein, equivocal for
stenosis.
IMPRESSION: CT PERFUSION: Compensated LEFT middle cerebral artery territory
ischemia (penumbra).

CTA NECK: Occluded LEFT internal carotid artery at the origin, which
appears acute.

Atherosclerosis without hemodynamically significant stenosis RIGHT
ICA.

Congenital canal stenosis, status post C4-5 through C6-7 ACDF.
Multilevel severe neural foraminal narrowing and moderate to severe
canal stenosis.

CTA HEAD: Retrograde flow into distal LEFT internal carotid artery.
Moderate stenosis LEFT M1. Occluded LEFT M2 and LEFT and M3 segments
compatible with thromboembolic disease.

Complete circle of Willis.

RIGHT temporal AVM with suspected 5 mm feeding aneurysm. In
addition, less than 2 mm RIGHT internal carotid artery blister
aneurysm.

Acute findings discussed with and reconfirmed by Dr.Vaheed,
neurology on 04/18/2015 at [DATE].

## 2016-11-15 IMAGING — CR DG CHEST 1V PORT
2 series · 2 of 2 positions shown · non-contrast
Comparison: 01/13/2015.

CLINICAL DATA: Acute respiratory failure .

EXAM:
PORTABLE CHEST 1 VIEW

[AP (1 of 2)]
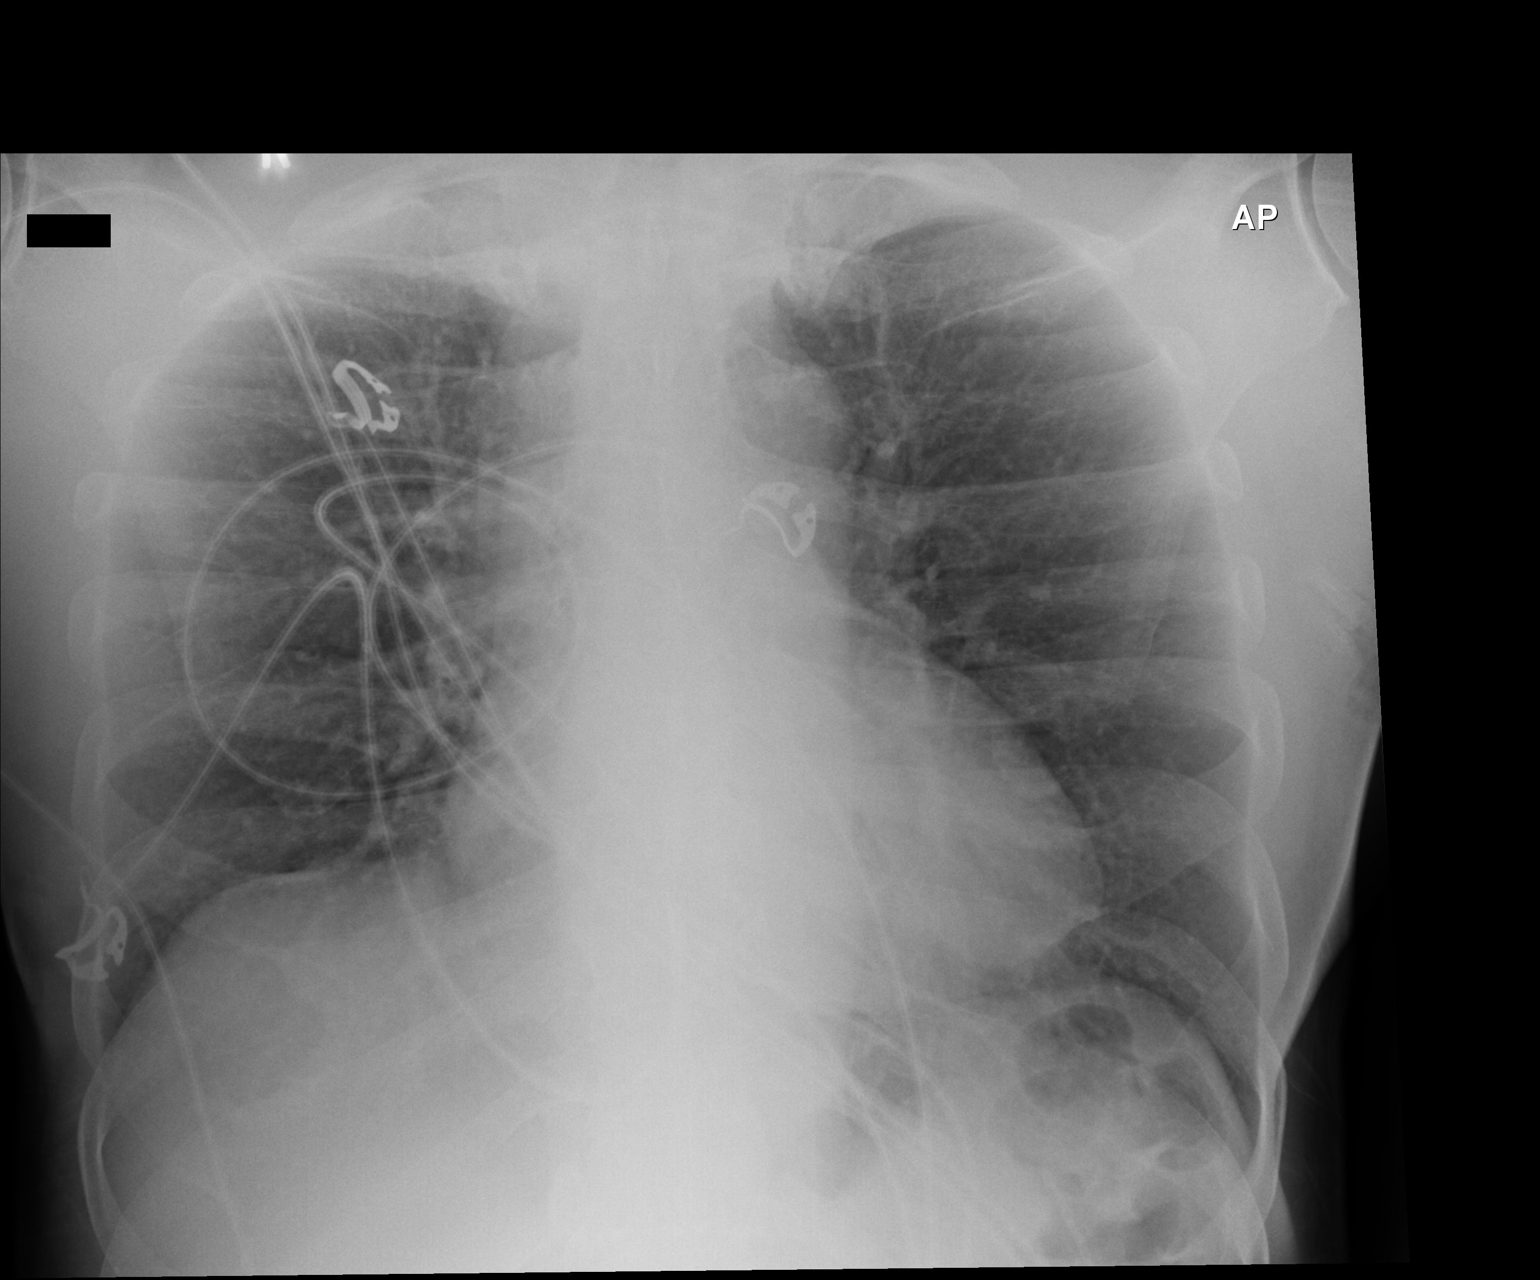

[AP (2 of 2)]
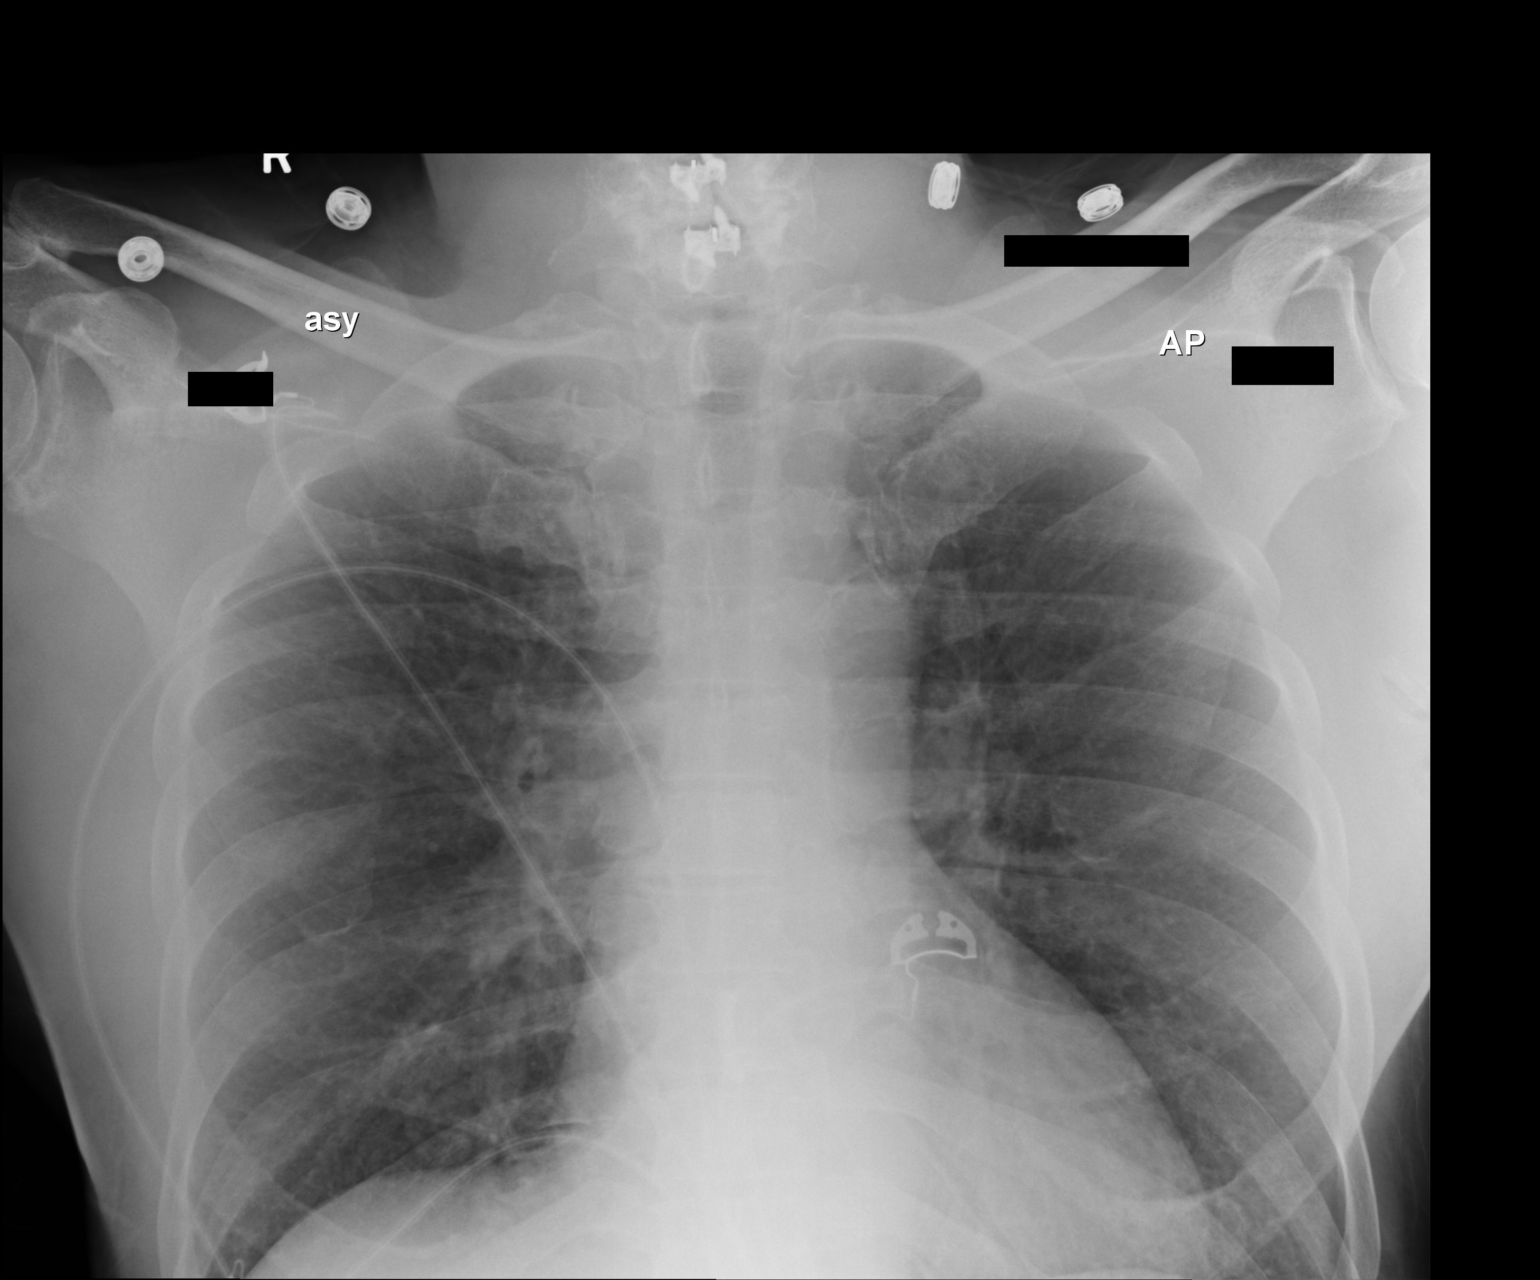

[2 of 2 positions shown; findings below may reference images not displayed]

FINDINGS: Cardiomegaly with mild pulmonary vascular and interstitial
prominence. Mild component congestive heart failure cannot be
excluded. No focal pulmonary infiltrate. No pleural effusion or
pneumothorax .
IMPRESSION: Cardiomegaly with mild pulmonary vascular prominence and mild
interstitial prominence. Mild component congestive heart failure
cannot be excluded .

## 2017-06-12 ENCOUNTER — Encounter: Payer: Self-pay | Admitting: Neurology

## 2017-08-31 ENCOUNTER — Encounter: Payer: Self-pay | Admitting: Neurology

## 2017-08-31 ENCOUNTER — Ambulatory Visit (INDEPENDENT_AMBULATORY_CARE_PROVIDER_SITE_OTHER): Payer: Self-pay | Admitting: Neurology

## 2017-08-31 VITALS — BP 86/60 | HR 84 | Resp 16 | Ht 74.0 in | Wt 175.0 lb

## 2017-08-31 DIAGNOSIS — R531 Weakness: Secondary | ICD-10-CM

## 2017-08-31 DIAGNOSIS — M48062 Spinal stenosis, lumbar region with neurogenic claudication: Secondary | ICD-10-CM

## 2017-08-31 DIAGNOSIS — I63412 Cerebral infarction due to embolism of left middle cerebral artery: Secondary | ICD-10-CM

## 2017-08-31 MED ORDER — PREGABALIN 75 MG PO CAPS
75.0000 mg | ORAL_CAPSULE | Freq: Two times a day (BID) | ORAL | 2 refills | Status: DC
Start: 2017-08-31 — End: 2017-11-16

## 2017-08-31 NOTE — Patient Instructions (Signed)
1.  We will check nerve study of left lower extremity 2.  We will refer you for physical therapy 3.  For nerve pain, we will try Lyrica 75mg  twice daily 4.  Follow up in 3 months.

## 2017-08-31 NOTE — Progress Notes (Signed)
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  History supplemented by referring provider's note and ED note  He has had problems with left leg pain and weakness for many years.  In 1978, he woke up with left hip pain and inability to move his left leg.  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  In 2000, he again developed severe left hip pain radiating from the left side of his back, causing weakness.    He began experiencing right leg pain as well.  He underwent multiple imaging of the lumbar spine.  MRI of the lumbar spine from 10/20/03 was personally reviewed and demonstrated multilevel spondylotic changes with spinal stenosis at L3-4, L4-5 and L5-S1.  A follow-up lumbar myelogram from 01/21/04 confirmed multifactorial multilevel spinal stenosis most marked on the right at L4-5 and diffuse bulge at L5-S1 with bilateral face disease causing bilateral right greater than left neural foraminal narrowing.  He underwent decompression and laminectomy of L4 and L5 in 2006.  However, he continues to have low back and bilateral leg pain and weakness.  He specifically reports burning pain along the lateral aspect of his left hip and upper leg.    Most recent available MRI of the lumbar spine was performed on 11/16/07, which demonstrated postsurgical changes at L4 and L5 but still with multilevel residual central and and lateral recess stenosis due to disc bulging.  Then  on 04/18/15, he had an embolic left MCA territorial stroke secondary to left ICA occlusion.  He underwent mechanical thrombectomy and revascularization with left ICA stent placement.  He is now on Plavix.  Recent Hgb A1c 5.4.  He is not on a statin but recent LDL was 50.  He reports residual right upper and lower extremity weakness.  He reports continued weakness and fatigue.  He reports poor appetite and has lost 40 lbs..  He has history of daytime somnolence and insomnia.  For pain, gabapentin 800mg  three times daily is ineffective.  He was seen in the ED on 10/21/15 for acute onset of generalized weakness and fatigue.  He reported lightheadedness, diaphoresis, nausea and vomting.  Fingerstick blood glucose was normal.  Workup, including EKG and troponins, were negative.  CT of head was personally reviewed and demonstrated demonstrated grossly normal gray-white differentiation with mild cortical volume loss but no acute stroke or bleed.  Since around 2005, he has required assistance walking.  PAST MEDICAL HISTORY: Past Medical History:  Diagnosis Date  . CVA (cerebral infarction)   . Depression   . Hypertension   . Nerve damage   . PTSD (post-traumatic stress disorder)     PAST SURGICAL HISTORY: Past Surgical History:  Procedure Laterality Date  . ANTERIOR CERVICAL DECOMPRESSION/DISCECTOMY FUSION 4 LEVELS  01/20/2015   C4-C7  . BACK SURGERY    . HEMORROIDECTOMY    . HIP SURGERY    . RADIOLOGY WITH ANESTHESIA N/A 04/18/2015   Procedure: RADIOLOGY WITH ANESTHESIA;  Surgeon: Julieanne Cotton, MD;  Location: [facility] OR;  Service: Radiology;  Laterality: N/A;  MEDICATIONS: Current Outpatient Medications on File Prior to Visit  Medication Sig Dispense Refill  . albuterol (PROVENTIL HFA;VENTOLIN HFA) 108 (90 BASE) MCG/ACT inhaler Inhale 1-2 puffs into the lungs every 6 (six) hours as needed for wheezing. 1 Inhaler 0  . clopidogrel (PLAVIX) 75 MG tablet Take 1 tablet (75 mg total) by mouth  daily with breakfast. 30 tablet 2  . gabapentin (NEURONTIN) 800 MG tablet Take 800 mg by mouth 3 (three) times daily.    . hydrochlorothiazide (HYDRODIURIL) 25 MG tablet Take 12.5 mg by mouth daily.     Marland Kitchen losartan (COZAAR) 100 MG tablet Take 50 mg by mouth daily.    . ondansetron (ZOFRAN ODT) 4 MG disintegrating tablet Take 1 tablet (4 mg total) by mouth every 8 (eight) hours as needed for nausea. 10 tablet 0  . sildenafil (VIAGRA) 100 MG tablet Take 100 mg by mouth daily as needed for erectile dysfunction.     No current facility-administered medications on file prior to visit.     ALLERGIES: Allergies  Allergen Reactions  . Morphine Sulfate Nausea And Vomiting    FAMILY HISTORY: Family History  Problem Relation Age of Onset  . Heart attack Father   . Emphysema Father   . Hypertension Mother   . Diabetes Mother     SOCIAL HISTORY: Social History   Socioeconomic History  . Marital status: Single    Spouse name: Not on file  . Number of children: Not on file  . Years of education: Not on file  . Highest education level: Not on file  Occupational History  . Not on file  Social Needs  . Financial resource strain: Not on file  . Food insecurity:    Worry: Not on file    Inability: Not on file  . Transportation needs:    Medical: Not on file    Non-medical: Not on file  Tobacco Use  . Smoking status: Current Every Day Smoker    Packs/day: 1.00    Types: Cigarettes  . Smokeless tobacco: Never Used  Substance and Sexual Activity  . Alcohol use: No  . Drug use: No    Types: Marijuana    Comment: denies  . Sexual activity: Not on file  Lifestyle  . Physical activity:    Days per week: Not on file    Minutes per session: Not on file  . Stress: Not on file  Relationships  . Social connections:    Talks on phone: Not on file    Gets together: Not on file    Attends religious service: Not on file    Active member of club or organization: Not on file    Attends  meetings of clubs or organizations: Not on file    Relationship status: Not on file  . Intimate partner violence:    Fear of current or ex partner: Not on file    Emotionally abused: Not on file    Physically abused: Not on file    Forced sexual activity: Not on file  Other Topics Concern  . Not on file  Social History Narrative  . Not on file    REVIEW OF SYSTEMS: Constitutional: No fevers, chills, or sweats, no generalized fatigue, change in appetite Eyes: No visual changes, double vision, eye pain Ear, nose and throat: No hearing loss, ear pain, nasal congestion, sore throat Cardiovascular: No chest pain, palpitations Respiratory:  No shortness of breath at rest or with exertion, wheezes GastrointestinaI: No nausea, vomiting, diarrhea,  abdominal pain, fecal incontinence Genitourinary:  No dysuria, urinary retention or frequency Musculoskeletal:  Back and leg pain Integumentary: No rash, pruritus, skin lesions Neurological: as above Psychiatric: depression, insomnia, anxiety Endocrine: No palpitations, fatigue, diaphoresis, mood swings, change in appetite, change in weight, increased thirst Hematologic/Lymphatic:  No purpura, petechiae. Allergic/Immunologic: no itchy/runny eyes, nasal congestion, recent allergic reactions, rashes  PHYSICAL EXAM: Vitals:   08/31/17 0819  BP: (!) 86/60  Pulse: 84  Resp: 16  SpO2: 97%   General: No acute distress.  Thin Head:  Normocephalic/atraumatic Eyes:  fundi examined but not visualized Neck: supple, no paraspinal tenderness, full range of motion Back: No paraspinal tenderness Heart: regular rate and rhythm Lungs: Clear to auscultation bilaterally. Vascular: No carotid bruits. Neurological Exam: Mental status: alert and oriented to person, place, and time, recent and remote memory intact, fund of knowledge intact, attention and concentration intact, speech fluent and not dysarthric, language intact. Cranial nerves: CN I: not  tested CN II: pupils equal, round and reactive to light, visual fields intact CN III, IV, VI:  full range of motion, no nystagmus, no ptosis CN V: facial sensation intact CN VII: upper and lower face symmetric CN VIII: hearing intact CN IX, X: gag intact, uvula midline CN XI: sternocleidomastoid and trapezius muscles intact CN XII: tongue midline Bulk & Tone: normal, no fasciculations. Motor:  5-/5 bilateral hip flexion.  Otherwise, 5/5 throughout Sensation:  Pinprick sensation decreased in right lower extremity and vibration sensation intact.. Deep Tendon Reflexes:  2+ throughout, toes downgoing.  Finger to nose testing:  Without dysmetria. Gait:  Unsteady, wide-based bilateral antalgic gait.  Unable to tandem walk.  Romberg negative.  IMPRESSION: Progressive lower extremity weakness in a patient with lumbar stenosis and history of left MCA territory stroke.  He reports continued pain, which likely is due to his lumbar stenosis.  Continued weakness is likely related to decompensation (as he is typically sedentary) and due to limitations by pain.  PLAN: 1.  We will check NCV-EMG of left lower extremity to evaluate for any active denervation or possibly myopathic changes. 2.  Order physical therapy. 3.  Gabapentin ineffective.  Order Lyrica 75mg  twice daily 4.  Follow up in 3 to 4 months.  Thank you for allowing me to take part in the care of this patient.  Shon MilletAdam Kahlia Lagunes, DO  CC:  Fleet ContrasEdwin Avbuere, MD  Felecia Shellinglga Thompson, MD

## 2017-09-07 ENCOUNTER — Ambulatory Visit (INDEPENDENT_AMBULATORY_CARE_PROVIDER_SITE_OTHER): Payer: Non-veteran care | Admitting: Neurology

## 2017-09-07 DIAGNOSIS — M48062 Spinal stenosis, lumbar region with neurogenic claudication: Secondary | ICD-10-CM

## 2017-09-07 DIAGNOSIS — M5416 Radiculopathy, lumbar region: Secondary | ICD-10-CM

## 2017-09-07 NOTE — Procedures (Signed)
[facility]  [address], [address]  [place], [place] [address] Tel: [phone] Fax:  [phone] Test Date:  09/07/2017  Patient: Craig DragonJames Maffett DOB: Oct 12, 1955 Physician: Nita Sickleonika Patel, DO  Sex: Male Height: 6\' 2"  Ref Phys: Shon MilletAdam Jaffe, DO  ID#: [id number] Temp: 33.8C Technician:    Patient Complaints: This is a 62 year-old man with history of lumbar surgery and left MCA stroke referred for evaluation of left leg weakness and pain.  NCV & EMG Findings: Extensive electrodiagnostic testing of the left lower extremity shows:  1. Left sural and superficial peroneal sensory responses are within normal limits.  2. Left peroneal and tibial motor responses are within normal limits.  3. Tibial H reflex study is within normal limits when adjusted for patient's height.  4. Chronic motor axon loss changes are seen affecting the left tibialis anterior and rectus femoris muscles, without accompanied active denervation.   Impression: 1. Chronic L4 radiculopathy affecting the left lower extremity, mild in degree electrically. 2. There is no evidence of a sensorimotor polyneuropathy or diffuse myopathy.   ___________________________ Nita Sickleonika Patel, DO    Nerve Conduction Studies Anti Sensory Summary Table   Site NR Peak (ms) Norm Peak (ms) P-T Amp (V) Norm P-T Amp  Left Sup Peroneal Anti Sensory (Ant Lat Mall)  33.8C  12 cm    2.6 <4.6 4.0 >3  Left Sural Anti Sensory (Lat Mall)  33.8C  Calf    3.8 <4.6 4.2 >3   Motor Summary Table   Site NR Onset (ms) Norm Onset (ms) O-P Amp (mV) Norm O-P Amp Site1 Site2 Delta-0 (ms) Dist (cm) Vel (m/s) Norm Vel (m/s)  Left Peroneal Motor (Ext Dig Brev)  33.8C  Ankle    4.5 <6.0 2.5 >2.5 B Fib Ankle 9.3 41.0 44 >40  B Fib    13.8  2.3  Poplt B Fib 1.4 8.0 57 >40  Poplt    15.2  2.3         Left Peroneal TA Motor (Tib Ant)  33.8C  Fib Head    4.0 <4.5 4.0 >3 Poplit Fib Head 1.1 7.0 64 >40  Poplit    5.1  3.9         Left Tibial  Motor (Abd Hall Brev)  33.8C  Ankle    4.8 <6.0 6.3 >4 Knee Ankle 9.9 48.0 48 >40  Knee    14.7  3.5          H Reflex Studies   NR H-Lat (ms) Lat Norm (ms) L-R H-Lat (ms)  Left Tibial (Gastroc)  33.8C     35.92 <35    EMG   Side Muscle Ins Act Fibs Psw Fasc Number Recrt Dur Dur. Amp Amp. Poly Poly. Comment  Left AntTibialis Nml Nml Nml Nml 1- Rapid Some 1+ Few 1+ Nml Nml N/A  Left Gastroc Nml Nml Nml Nml Nml Nml Nml Nml Nml Nml Nml Nml N/A  Left Flex Dig Long Nml Nml Nml Nml Nml Nml Nml Nml Nml Nml Nml Nml N/A  Left RectFemoris Nml Nml Nml Nml 1- Rapid Some 1+ Some 1+ Nml Nml N/A  Left GluteusMed Nml Nml Nml Nml Nml Nml Nml Nml Nml Nml Nml Nml N/A  Left BicepsFemS Nml Nml Nml Nml Nml Nml Nml Nml Nml Nml Nml Nml N/A  Left AdductorLong Nml Nml Nml Nml Nml Nml Nml Nml Nml Nml Nml Nml N/A      Waveforms:

## 2017-09-12 ENCOUNTER — Other Ambulatory Visit: Payer: Self-pay

## 2017-09-12 DIAGNOSIS — R531 Weakness: Secondary | ICD-10-CM

## 2017-09-12 DIAGNOSIS — M48062 Spinal stenosis, lumbar region with neurogenic claudication: Secondary | ICD-10-CM

## 2017-09-21 ENCOUNTER — Encounter: Payer: Self-pay | Admitting: Neurology

## 2017-11-16 ENCOUNTER — Encounter (HOSPITAL_COMMUNITY): Payer: Self-pay | Admitting: Emergency Medicine

## 2017-11-16 ENCOUNTER — Other Ambulatory Visit: Payer: Self-pay

## 2017-11-16 ENCOUNTER — Emergency Department (HOSPITAL_COMMUNITY)
Admission: EM | Admit: 2017-11-16 | Discharge: 2017-11-16 | Disposition: A | Discharge status: Home / Self Care | Payer: No Typology Code available for payment source | Attending: Emergency Medicine | Admitting: Emergency Medicine

## 2017-11-16 DIAGNOSIS — Z760 Encounter for issue of repeat prescription: Secondary | ICD-10-CM | POA: Diagnosis not present

## 2017-11-16 DIAGNOSIS — Z8673 Personal history of transient ischemic attack (TIA), and cerebral infarction without residual deficits: Secondary | ICD-10-CM | POA: Insufficient documentation

## 2017-11-16 DIAGNOSIS — I10 Essential (primary) hypertension: Secondary | ICD-10-CM | POA: Diagnosis not present

## 2017-11-16 DIAGNOSIS — R531 Weakness: Secondary | ICD-10-CM | POA: Diagnosis present

## 2017-11-16 DIAGNOSIS — F17210 Nicotine dependence, cigarettes, uncomplicated: Secondary | ICD-10-CM | POA: Insufficient documentation

## 2017-11-16 LAB — BASIC METABOLIC PANEL
Anion gap: 9 (ref 5–15)
BUN: 11 mg/dL (ref 6–20)
CHLORIDE: 106 mmol/L (ref 101–111)
CO2: 24 mmol/L (ref 22–32)
CREATININE: 0.95 mg/dL (ref 0.61–1.24)
Calcium: 9.6 mg/dL (ref 8.9–10.3)
GFR calc Af Amer: >60 mL/min (ref >60)
GFR calc non Af Amer: >60 mL/min (ref >60)
GLUCOSE: 88 mg/dL (ref 65–99)
POTASSIUM: 3.6 mmol/L (ref 3.5–5.1)
SODIUM: 139 mmol/L (ref 135–145)

## 2017-11-16 LAB — CBC
HCT: 47 % (ref 39.0–52.0)
HEMOGLOBIN: 16.2 g/dL (ref 13.0–17.0)
MCH: 32.5 pg (ref 26.0–34.0)
MCHC: 34.5 g/dL (ref 30.0–36.0)
MCV: 94.2 fL (ref 78.0–100.0)
PLATELETS: 178 10*3/uL (ref 150–400)
RBC: 4.99 MIL/uL (ref 4.22–5.81)
RDW: 13.7 % (ref 11.5–15.5)
WBC: 5.5 10*3/uL (ref 4.0–10.5)

## 2017-11-16 MED ORDER — PREGABALIN 75 MG PO CAPS: 75.0000 mg | ORAL_CAPSULE | Freq: Two times a day (BID) | ORAL | 0 refills | Status: DC

## 2017-11-16 NOTE — ED Triage Notes (Addendum)
Pt states he ran out of his lyrica 3 days ago and his body feels terrible. [facility] and [facility] sure [facility] could get a ref[facility]. Pt states he feels weak.

## 2017-11-16 NOTE — Discharge Instructions (Addendum)
Your blood work was reassuring.   I have written you a prescription for Lyrica.  Please follow-up with your regular doctor for further refills.  Return to the ER if you have any new or concerning symptoms.

## 2017-11-16 NOTE — ED Provider Notes (Signed)
[facility] EMERGENCY DEPARTMENT Provider Note   CSN: [id number] Arrival date & time: 11/16/17  1656     History   Chief Complaint Chief Complaint  Patient presents with  . Medication Refill    HPI Craig Holden is a 62 y.o. male.  HPI  Patient is a 62yo male with a history of CVA, tobacco use, depression, hypertension, hyperlipidemia who presents to the emergency department for medication refill.  Patient states that he ran out of his Lyrica 3 days ago and shortly after started feeling badly.  He reports that "my body feels terrible."  States that he feels generally weak, "like it is hard to hold my head up."  Wondering if you are dehydrated.  He states that he has not had much of an appetite, but has been able to drink fluids.  He is trying to schedule an appointment with his doctor to follow up at the [place], but has been unsuccessful and is waiting on a phone call back from the office secretary. He denies fever, chills, visual disturbance, focal numbness or weakness, headache, congestion, ear pain, sore throat, sob, cp, abdominal pain, nausea/vomiting, diarrhea, dysuria, urinary frequency.   Past Medical History:  Diagnosis Date  . CVA (cerebral infarction)   . Depression   . Hypertension   . Nerve damage   . PTSD (post-traumatic stress disorder)     Patient Active Problem List   Diagnosis Date Noted  . Occlusion of left internal carotid artery, s/p revascularization 04/21/2015  . 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 HLD (hyperlipidemia)   . Tobacco use disorder   . Marijuana abuse   . Preoperative evaluation to rule out surgical contraindication 01/01/2014  . Essential hypertension 01/01/2014  . History of stroke 01/01/2014    Past Surgical History:  Procedure Laterality Date  . ANTERIOR CERVICAL DECOMPRESSION/DISCECTOMY FUSION 4 LEVELS  01/20/2015   C4-C7  . BACK SURGERY    . HEMORROIDECTOMY    . HIP SURGERY    .  RADIOLOGY WITH ANESTHESIA N/A 04/18/2015   Procedure: RADIOLOGY WITH ANESTHESIA;  Surgeon: Julieanne Cotton, MD;  Location: [facility] OR;  Service: Radiology;  Laterality: N/A;        Home Medications    Prior to Admission medications   Medication Sig Start Date End Date Taking? Authorizing Provider  albuterol (PROVENTIL HFA;VENTOLIN HFA) 108 (90 BASE) MCG/ACT inhaler Inhale 1-2 puffs into the lungs every 6 (six) hours as needed for wheezing. 06/12/11   Palumbo, April, MD  clopidogrel (PLAVIX) 75 MG tablet Take 1 tablet (75 mg total) by mouth daily with breakfast. 04/21/15   Layne Benton, NP  gabapentin (NEURONTIN) 800 MG tablet Take 800 mg by mouth 3 (three) times daily.    [provider]  hydrochlorothiazide (HYDRODIURIL) 25 MG tablet Take 12.5 mg by mouth daily.     [provider]  losartan (COZAAR) 100 MG tablet Take 50 mg by mouth daily.    [provider]  ondansetron (ZOFRAN ODT) 4 MG disintegrating tablet Take 1 tablet (4 mg total) by mouth every 8 (eight) hours as needed for nausea. 07/02/16   Eber Hong, MD  pregabalin (LYRICA) 75 MG capsule Take 1 capsule (75 mg total) by mouth 2 (two) times daily. 08/31/17   Drema Dallas, DO  sildenafil (VIAGRA) 100 MG tablet Take 100 mg by mouth daily as needed for erectile dysfunction.    [provider]  Family History Family History  Problem Relation Age of Onset  . Heart attack Father   . Emphysema Father   . Hypertension Mother   . Diabetes Mother     Social History Social History   Tobacco Use  . Smoking status: Current Every Day Smoker    Packs/day: 1.00    Types: Cigarettes  . Smokeless tobacco: Never Used  Substance Use Topics  . Alcohol use: No  . Drug use: No    Types: Marijuana    Comment: denies     Allergies   Morphine sulfate   Review of Systems Review of Systems  Constitutional: Positive for fatigue. Negative for chills and fever.  HENT: Negative for congestion,  rhinorrhea and sore throat.   Eyes: Negative for visual disturbance.  Respiratory: Negative for shortness of breath.   Cardiovascular: Negative for chest pain.  Gastrointestinal: Negative for abdominal pain, nausea and vomiting.  Genitourinary: Negative for difficulty urinating, dysuria and frequency.  Musculoskeletal: Negative for gait problem.  Skin: Negative for rash.  Neurological: Positive for weakness. Negative for syncope, light-headedness, numbness and headaches.  Psychiatric/Behavioral: Negative for agitation.     Physical Exam Updated Vital Signs BP 137/82 (BP Location: Right Arm)   Pulse 65   Temp 98.3 F (36.8 C)   Resp 14   Ht 6\' 2"  (1.88 m)   Wt 79.4 kg (175 lb)   SpO2 99%   BMI 22.47 kg/m   Physical Exam  Constitutional: He is oriented to person, place, and time. He appears well-developed and well-nourished. No distress.  Sitting up in bed in no apparent distress, nontoxic-appearing.  HENT:  Head: Normocephalic and atraumatic.  Mouth/Throat: Oropharynx is clear and moist. No oropharyngeal exudate.  Eyes: Pupils are equal, round, and reactive to light. Conjunctivae and EOM are normal. Right eye exhibits no discharge. Left eye exhibits no discharge.  Neck: Normal range of motion. Neck supple.  Cardiovascular: Normal rate, regular rhythm and intact distal pulses.  No murmur heard. Pulmonary/Chest: Effort normal and breath sounds normal. No stridor. No respiratory distress. He has no wheezes. He has no rales.  Abdominal: Soft. Bowel sounds are normal. There is no tenderness.  Musculoskeletal: Normal range of motion.  Neurological: He is alert and oriented to person, place, and time. Coordination normal.  Mental Status:  Alert, oriented, thought content appropriate, able to give a coherent history. Speech fluent without evidence of aphasia. Able to follow 2 step commands without difficulty.  Cranial Nerves:  II:  Peripheral visual fields grossly normal, pupils  equal, round, reactive to light III,IV, VI: ptosis not present, extra-ocular motions intact bilaterally  V,VII: smile symmetric, facial light touch sensation equal VIII: hearing grossly normal to voice  X: uvula elevates symmetrically  XI: bilateral shoulder shrug symmetric and strong XII: midline tongue extension without fassiculations Motor:  Normal tone. 5/5 in upper and lower extremities bilaterally including strong and equal grip strength and dorsiflexion/plantar flexion Sensory: Pinprick and light touch normal in all extremities.  Gait: normal gait and balance  Skin: Skin is warm and dry. He is not diaphoretic.  Psychiatric: He has a normal mood and affect. His behavior is normal.  Nursing note and vitals reviewed.    ED Treatments / Results  Labs (all labs ordered are listed, but only abnormal results are displayed) Labs Reviewed  CBC  BASIC METABOLIC PANEL    EKG None  Radiology No results found.  Procedures Procedures (including critical care time)  Medications Ordered in ED Medications -  No data to display   Initial Impression / Assessment and Plan / ED Course  I have reviewed the triage vital signs and the nursing notes.  Pertinent labs & imaging results that were available during my care of the patient were reviewed by me and considered in my medical decision making (see chart for details).     Patient presents with complaint of body pain and generalized weakness after running out of his Lyrica 3 days ago.  No focal symptoms and normal neurological exam.  No concern for acute stroke.  Did get basic lab work including CBC and BMP which was WNL.  Plan to refill his Lyrica medication, as this is likely contributing to patient's symptoms today.  Have counseled him to follow-up with his PCP for further medication management.  Discussed reasons to return to the emergency department he agrees and voiced understanding to the above plan.  Final Clinical  Impressions(s) / ED Diagnoses   Final diagnoses:  Medication refill  Weakness    ED Discharge Orders        Ordered    pregabalin (LYRICA) 75 MG capsule  2 times daily     11/16/17 1956       Lawrence MarseillesShrosbree, Paige Monarrez J, PA-C 11/16/17 2354    Tilden Fossaees, Elizabeth, MD 11/17/17 [phone]

## 2017-11-16 NOTE — ED Provider Notes (Signed)
Patient placed in Quick Look pathway, seen and evaluated   Chief Complaint: chest pain  HPI:  Patient with left arm and chest pain sharp, cgottne worse, +s subjective weakness. [facility] and says " they didn't do anything." told is was a pinched nerve  ROS: Left arm pain  (one)  Physical Exam:   Gen: No distress  Neuro: Awake and Alert  Skin: Warm    Focused Exam: negative hoffman's test left hand, normal pulse   Initiation of care has begun. The patient has been counseled on the process, plan, and necessity for staying for the completion/evaluation, and the remainder of the medical screening examination    Arthor CaptainHarris, Deadrick Stidd, Cordelia Poche-C 11/16/17 1717    Linwood DibblesKnapp, Jon, MD 11/19/17 2025

## 2017-11-16 NOTE — ED Notes (Signed)
Blood work attempted to be collected x1. RN notified.

## 2017-12-08 ENCOUNTER — Ambulatory Visit (INDEPENDENT_AMBULATORY_CARE_PROVIDER_SITE_OTHER): Payer: No Typology Code available for payment source | Admitting: Neurology

## 2017-12-08 ENCOUNTER — Encounter: Payer: Self-pay | Admitting: Neurology

## 2017-12-08 VITALS — BP 128/80 | HR 81 | Ht 74.0 in | Wt 147.0 lb

## 2017-12-08 DIAGNOSIS — M4802 Spinal stenosis, cervical region: Secondary | ICD-10-CM

## 2017-12-08 DIAGNOSIS — M48062 Spinal stenosis, lumbar region with neurogenic claudication: Secondary | ICD-10-CM | POA: Diagnosis not present

## 2017-12-08 DIAGNOSIS — G992 Myelopathy in diseases classified elsewhere: Secondary | ICD-10-CM

## 2017-12-08 DIAGNOSIS — R531 Weakness: Secondary | ICD-10-CM | POA: Diagnosis not present

## 2017-12-08 DIAGNOSIS — I63412 Cerebral infarction due to embolism of left middle cerebral artery: Secondary | ICD-10-CM

## 2017-12-08 NOTE — Progress Notes (Signed)
NEUROLOGY FOLLOW UP OFFICE NOTE  Craig Holden [id number]  HISTORY OF PRESENT ILLNESS: Craig Holden is a 62 year old right-handed male with hypertension, hyperlipidemia, major depressive disorder, tobacco and marijuana use, and cerebrovascular disease with history of embolic left MCA infarct secondary to left ICA occlusion s/p mechanical thrombectomy in Novemeber 2016 and history of lumbar stenosis status post surgery and cervical ACDF who follows up for weakness.  UPDATE: He underwent NCV-EMG of the left lower extremity on 09/07/17 which demonstrated mild chronic L4 radiculopathy but otherwise no electrodiagnostic evidence of polyneuropathy or myopathy.  As gabapentin was ineffective, it was switched to Lyrica 75mg  twice daily.  It was ineffective but the [place] would not refill it due to cost, so they prescribed him amitriptyline 10mg .  However, he hasn't started it yet.  He has not started PT.  HISTORY:  He has had problems with left leg pain and weakness for many years.  In 1978, he woke up with left hip pain and inability to move his left leg.  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  In 2000, he again developed severe left hip pain radiating from the left side of his back, causing weakness.    He began experiencing right leg pain as well.  He underwent multiple imaging of the lumbar spine.  MRI of the lumbar spine from 10/20/03 was personally reviewed and demonstrated multilevel spondylotic changes with spinal stenosis at L3-4, L4-5 and L5-S1.  A follow-up lumbar myelogram from 01/21/04 confirmed multifactorial multilevel spinal stenosis most marked on the right at L4-5 and diffuse bulge at L5-S1 with bilateral face disease causing bilateral right greater than left neural foraminal narrowing.  He underwent decompression and laminectomy of L4 and L5 in 2006.  However, he continues to have low back and bilateral leg pain and weakness.  He specifically reports burning pain along the  lateral aspect of his left hip and upper leg.    MRI of the lumbar spine was performed on 11/16/07, which demonstrated postsurgical changes at L4 and L5 but still with multilevel residual central and and lateral recess stenosis due to disc bulging.  Due to continued neck pain, paresthesias in arms and weakness in legs, he had MRI of cervical spine in 2016 which demonstrated spondylosis with spinal stenosis and spinal cord compression worse at C4-5, C5-6 and C6-7.  He underwent ACDF at C4-5, C5-6 and C6-7 in August 2016.  Then on 04/18/15, he had an embolic left MCA territorial stroke secondary to left ICA occlusion.  He underwent mechanical thrombectomy and revascularization with left ICA stent placement.  He is now on Plavix.  Recent Hgb A1c 5.4.  He is not on a statin but recent LDL was 50.  He reports residual right upper and lower extremity weakness.  He reports continued weakness and fatigue.  He reports poor appetite and has lost 40 lbs..  He has history of daytime somnolence and insomnia.  For pain, gabapentin 800mg  three times daily is ineffective.   He was seen in the ED on 10/21/15 for acute onset of generalized weakness and fatigue.  He reported lightheadedness, diaphoresis, nausea and vomting.  Fingerstick blood glucose was normal.  Workup, including EKG and troponins, were negative.  CT of head was personally reviewed and demonstrated demonstrated grossly normal gray-white differentiation with mild cortical volume loss but no acute stroke or bleed.   Since around 2005, he has required assistance walking.  PAST MEDICAL HISTORY: Past Medical History:  Diagnosis Date  .  CVA (cerebral infarction)   . Depression   . Hypertension   . Nerve damage   . PTSD (post-traumatic stress disorder)     MEDICATIONS: Current Outpatient Medications on File Prior to Visit  Medication Sig Dispense Refill  . albuterol (PROVENTIL HFA;VENTOLIN HFA) 108 (90 BASE) MCG/ACT inhaler Inhale 1-2 puffs into the lungs  every 6 (six) hours as needed for wheezing. 1 Inhaler 0  . clopidogrel (PLAVIX) 75 MG tablet Take 1 tablet (75 mg total) by mouth daily with breakfast. 30 tablet 2  . gabapentin (NEURONTIN) 800 MG tablet Take 800 mg by mouth 3 (three) times daily.    . hydrochlorothiazide (HYDRODIURIL) 25 MG tablet Take 12.5 mg by mouth daily.     Marland Kitchen losartan (COZAAR) 100 MG tablet Take 50 mg by mouth daily.    . ondansetron (ZOFRAN ODT) 4 MG disintegrating tablet Take 1 tablet (4 mg total) by mouth every 8 (eight) hours as needed for nausea. 10 tablet 0  . pregabalin (LYRICA) 75 MG capsule Take 1 capsule (75 mg total) by mouth 2 (two) times daily. 60 capsule 0  . sildenafil (VIAGRA) 100 MG tablet Take 100 mg by mouth daily as needed for erectile dysfunction.     No current facility-administered medications on file prior to visit.     ALLERGIES: Allergies  Allergen Reactions  . Morphine Sulfate Nausea And Vomiting    FAMILY HISTORY: Family History  Problem Relation Age of Onset  . Heart attack Father   . Emphysema Father   . Hypertension Mother   . Diabetes Mother     SOCIAL HISTORY: Social History   Socioeconomic History  . Marital status: Single    Spouse name: Not on file  . Number of children: Not on file  . Years of education: Not on file  . Highest education level: Not on file  Occupational History  . Not on file  Social Needs  . Financial resource strain: Not on file  . Food insecurity:    Worry: Not on file    Inability: Not on file  . Transportation needs:    Medical: Not on file    Non-medical: Not on file  Tobacco Use  . Smoking status: Current Every Day Smoker    Packs/day: 1.00    Types: Cigarettes  . Smokeless tobacco: Never Used  Substance and Sexual Activity  . Alcohol use: No  . Drug use: No    Types: Marijuana    Comment: denies  . Sexual activity: Not on file  Lifestyle  . Physical activity:    Days per week: Not on file    Minutes per session: Not on file   . Stress: Not on file  Relationships  . Social connections:    Talks on phone: Not on file    Gets together: Not on file    Attends religious service: Not on file    Active member of club or organization: Not on file    Attends meetings of clubs or organizations: Not on file    Relationship status: Not on file  . Intimate partner violence:    Fear of current or ex partner: Not on file    Emotionally abused: Not on file    Physically abused: Not on file    Forced sexual activity: Not on file  Other Topics Concern  . Not on file  Social History Narrative  . Not on file    REVIEW OF SYSTEMS: Constitutional: No fevers, chills, or sweats,  no generalized fatigue Eyes: No visual changes, double vision, eye pain Ear, nose and throat: No hearing loss, ear pain, nasal congestion, sore throat Cardiovascular: No chest pain, palpitations Respiratory:  No shortness of breath at rest or with exertion, wheezes GastrointestinaI: No nausea, vomiting, diarrhea, abdominal pain, fecal incontinence Genitourinary:  No dysuria, urinary retention or frequency Musculoskeletal:  Back and leg pain Integumentary: No rash, pruritus, skin lesions Neurological: as above Psychiatric: No depression, insomnia, anxiety Endocrine: Decreased appetite Hematologic/Lymphatic:  No purpura, petechiae. Allergic/Immunologic: no itchy/runny eyes, nasal congestion, recent allergic reactions, rashes  PHYSICAL EXAM: Vitals:   12/08/17 0903  BP: 128/80  Pulse: 81  SpO2: 96%   General: No acute distress.  Patient appears well-groomed.   Head:  Normocephalic/atraumatic Eyes:  Fundi examined but not visualized Neck: supple, no paraspinal tenderness, full range of motion Heart:  Regular rate and rhythm Lungs:  Clear to auscultation bilaterally Back: No paraspinal tenderness Neurological Exam: alert and oriented to person, place, and time. Attention span and concentration intact, recent and remote memory intact, fund  of knowledge intact.  Speech fluent and not dysarthric, language intact.  CN II-XII intact. Bulk and tone normal, muscle strength 5-/5 bilateral hip flexion, othewise 5/5 throughout.  Sensation to light touch  intact.  Deep tendon reflexes 2+ throughout.  Finger to nose testing intact.  Unsteady wide-based bilateral antalgic gait.    IMPRESSION: Lower extremity weakness in a patient with history of cervical and lumbar stenosis and history of left MCA territory stroke.  He reports continued pain, which likely is due to his lumbar stenosis.  He reports now that the weakness and pain is not progressed but rather continued residual symptoms.  I think the pain is playing a significant factor.  PLAN: 1.  I advised to try the amitriptyline 10mg  at bedtime.  We can increase dose in 4 weeks if needed. 2.  Recommend PT 3.  Follow up in 5 months  18 minutes spent face to face with patient, over 50% spent discussing management.   Shon MilletAdam Jyllian Haynie, DO  CC:  Dr. Concepcion ElkAvbuere

## 2017-12-08 NOTE — Patient Instructions (Signed)
1.  Start the amitriptyline 10mg  at bedtime.  It should help with nerve pain, sleep and to increase weight.  If pain not improved in 4 weeks, contact me and we can increase dose 2.  We will refer you to physical therapy. 3.  Follow up in 5 months.

## 2018-05-15 NOTE — Progress Notes (Signed)
NEUROLOGY FOLLOW UP OFFICE NOTE  Craig HildaJames B Speiser [id number]  HISTORY OF PRESENT ILLNESS: Craig HildaJames B. 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  UPDATE: Last visit, I recommended starting amitriptyline 10mg  at bedtime to address neuropathic pain and recommended physical therapy to help with leg weakness.  Pain improved and decided not to start amitriptyline.  [facility] Lyrica [facility] but hasn't filled it.  He has naproxen as needed but hasn't needed to use it..  He has a walker and wheelchair if needed. [place] but he doesn't [place] yet.    He notes loss of appetite.  He says food doesn't taste right.  He has lost close to 30 lbs over the past year.    HISTORY: He has had problems with left leg pain and weakness for many years.  In 1978, he woke up with left hip pain and inability to move his left leg.  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  In 2000, he again developed severe left hip pain radiating from the left side of his back, causing weakness.    He began experiencing right leg pain as well.  He underwent multiple imaging of the lumbar spine.  MRI of the lumbar spine from 10/20/03 was personally reviewed and demonstrated multilevel spondylotic changes with spinal stenosis at L3-4, L4-5 and L5-S1.  A follow-up lumbar myelogram from 01/21/04 confirmed multifactorial multilevel spinal stenosis most marked on the right at L4-5 and diffuse bulge at L5-S1 with bilateral face disease causing bilateral right greater than left neural foraminal narrowing.  He underwent decompression and laminectomy of L4 and L5 in 2006.   However, he continues to have low back and bilateral leg pain and weakness.  He specifically reports burning pain along the lateral aspect of his left hip and upper leg.    MRI of the lumbar spine was performed on 11/16/07, which demonstrated postsurgical changes at L4 and L5 but still with multilevel residual central and and lateral recess stenosis due to disc bulging.  Due to continued neck pain, paresthesias in arms and weakness in legs, he had MRI of cervical spine in 2016 which demonstrated spondylosis with spinal stenosis and spinal cord compression worse at C4-5, C5-6 and C6-7.  He underwent ACDF at C4-5, C5-6 and C6-7 in August 2016.  Then on 04/18/15, he had an embolic left MCA territorial stroke secondary to left ICA occlusion.  He underwent mechanical thrombectomy and revascularization with left ICA stent placement.  He is now on Plavix.  Recent Hgb A1c 5.4.  He is not on a statin but recent LDL was 50.  He reports residual right upper and lower extremity weakness.  He reports continued weakness and fatigue.  He reports poor appetite and has lost 40 lbs..  He has history of daytime somnolence and insomnia.  For pain, gabapentin 800mg  three times daily is ineffective.  He was seen in the ED on 10/21/15 for acute onset of generalized weakness and fatigue.  He reported lightheadedness, diaphoresis, nausea and vomting.  Fingerstick blood glucose was normal.  Workup, including EKG and troponins, were negative.  CT of head was personally reviewed and demonstrated demonstrated grossly normal gray-white differentiation  with mild cortical volume loss but no acute stroke or bleed.  He underwent NCV-EMG of the left lower extremity on 09/07/17 which demonstrated mild chronic L4 radiculopathy but otherwise no electrodiagnostic evidence of polyneuropathy or myopathy.  Since around 2005, he has required assistance walking.  Prior medications for neuropathic pain:  Gabapentin (ineffective), Lyrica (ineffective  and [facility] would not [facility] cost)  PAST MEDICAL HISTORY: Past Medical History:  Diagnosis Date  . CVA (cerebral infarction)   . Depression   . Hypertension   . Nerve damage   . PTSD (post-traumatic stress disorder)     MEDICATIONS: Current Outpatient Medications on File Prior to Visit  Medication Sig Dispense Refill  . albuterol (PROVENTIL HFA;VENTOLIN HFA) 108 (90 BASE) MCG/ACT inhaler Inhale 1-2 puffs into the lungs every 6 (six) hours as needed for wheezing. 1 Inhaler 0  . clopidogrel (PLAVIX) 75 MG tablet Take 1 tablet (75 mg total) by mouth daily with breakfast. 30 tablet 2  . gabapentin (NEURONTIN) 800 MG tablet Take 800 mg by mouth 3 (three) times daily.    . hydrochlorothiazide (HYDRODIURIL) 25 MG tablet Take 12.5 mg by mouth daily.     Marland Kitchen losartan (COZAAR) 100 MG tablet Take 50 mg by mouth daily.    . ondansetron (ZOFRAN ODT) 4 MG disintegrating tablet Take 1 tablet (4 mg total) by mouth every 8 (eight) hours as needed for nausea. 10 tablet 0  . pregabalin (LYRICA) 75 MG capsule Take 1 capsule (75 mg total) by mouth 2 (two) times daily. 60 capsule 0  . sildenafil (VIAGRA) 100 MG tablet Take 100 mg by mouth daily as needed for erectile dysfunction.     No current facility-administered medications on file prior to visit.     ALLERGIES: Allergies  Allergen Reactions  . Morphine Sulfate Nausea And Vomiting    FAMILY HISTORY: Family History  Problem Relation Age of Onset  . Heart attack Father   . Emphysema Father   . Hypertension Mother   . Diabetes Mother    SOCIAL HISTORY: Social History   Socioeconomic History  . Marital status: Single    Spouse name: Not on file  . Number of children: Not on file  . Years of education: Not on file  . Highest education level: Not on file  Occupational History  . Not on file  Social Needs  . Financial resource strain: Not on file  . Food insecurity:    Worry: Not on file    Inability: Not on file  . Transportation needs:     Medical: Not on file    Non-medical: Not on file  Tobacco Use  . Smoking status: Current Every Day Smoker    Packs/day: 1.00    Types: Cigarettes  . Smokeless tobacco: Never Used  Substance and Sexual Activity  . Alcohol use: No  . Drug use: No    Types: Marijuana    Comment: denies  . Sexual activity: Not on file  Lifestyle  . Physical activity:    Days per week: Not on file    Minutes per session: Not on file  . Stress: Not on file  Relationships  . Social connections:    Talks on phone: Not on file    Gets together: Not on file    Attends religious service: Not on file    Active member of club or organization: Not on file    Attends meetings of clubs or organizations: Not on file    Relationship status: Not  on file  . Intimate partner violence:    Fear of current or ex partner: Not on file    Emotionally abused: Not on file    Physically abused: Not on file    Forced sexual activity: Not on file  Other Topics Concern  . Not on file  Social History Narrative  . Not on file    REVIEW OF SYSTEMS: Constitutional: loss of appetite Eyes: No visual changes, double vision, eye pain Ear, nose and throat: No hearing loss, ear pain, nasal congestion, sore throat Cardiovascular: No chest pain, palpitations Respiratory:  No shortness of breath at rest or with exertion, wheezes GastrointestinaI: No nausea, vomiting, diarrhea, abdominal pain, fecal incontinence Genitourinary:  No dysuria, urinary retention or frequency Musculoskeletal:  Back pain Integumentary: No rash, pruritus, skin lesions Neurological: as above Psychiatric: No depression, insomnia, anxiety Endocrine: Loss of appetite and weight Hematologic/Lymphatic:  No purpura, petechiae. Allergic/Immunologic: no itchy/runny eyes, nasal congestion, recent allergic reactions, rashes  PHYSICAL EXAM: Blood pressure 124/76, pulse 83, height 6\' 2"  (1.88 m), weight 156 lb (70.8 kg), SpO2 97 %. General: No acute distress.   Patient appears well-groomed.  thin body habitus. Head:  Normocephalic/atraumatic Eyes:  Fundi examined but not visualized Neck: supple, no paraspinal tenderness, full range of motion Heart:  Regular rate and rhythm Lungs:  Clear to auscultation bilaterally Back: No paraspinal tenderness Neurological Exam: alert and oriented to person, place, and time. Attention span and concentration intact, recent and remote memory intact, fund of knowledge intact.  Speech fluent and not dysarthric, language intact.  CN II-XII intact. Bulk and tone normal, muscle strength 5-/5 bilateral hip flexion, otherwise 5/5 throughout.  Sensation to temperature and vibration intact.  Deep tendon reflexes 2+ throughout, toes downgoing.  Finger to nose testing intact.  Unsteady wide-based bilateral antalgic gait.  Romberg with sway  IMPRESSION: 1.  Lower extremity weakness in a patient with history of cervical and lumbar stenosis and history of left MCA territory stroke.  He reports continued pain, which is likely due to his lumbar stenosis.  I think pain is playing a significant factor in his weakness. 2.  Weight loss  PLAN: 1.  At this time, he will continue physical therapy and use naproxen if needed.  If pain increases, he can either restart Lyrica or try amitriptyline. 2.  Advised to start Ensure to increase caloric intake.  Being followed by his PCP 3. Follow up in 6 months.  19 minutes spent face to face with patient, over 50% spent discussing management.  Shon Millet, DO  CC: Fleet Contras, MD

## 2018-05-16 ENCOUNTER — Encounter: Payer: Self-pay | Admitting: Neurology

## 2018-05-16 ENCOUNTER — Ambulatory Visit (INDEPENDENT_AMBULATORY_CARE_PROVIDER_SITE_OTHER): Payer: No Typology Code available for payment source | Admitting: Neurology

## 2018-05-16 VITALS — BP 124/76 | HR 83 | Ht 74.0 in | Wt 156.0 lb

## 2018-05-16 DIAGNOSIS — M48062 Spinal stenosis, lumbar region with neurogenic claudication: Secondary | ICD-10-CM | POA: Diagnosis not present

## 2018-05-16 DIAGNOSIS — I63412 Cerebral infarction due to embolism of left middle cerebral artery: Secondary | ICD-10-CM

## 2018-05-16 NOTE — Patient Instructions (Signed)
1. Continue physical therapy   2. Follow up in 6 months

## 2018-11-15 ENCOUNTER — Ambulatory Visit: Payer: No Typology Code available for payment source | Admitting: Neurology

## 2019-11-07 ENCOUNTER — Ambulatory Visit: Payer: No Typology Code available for payment source | Attending: Internal Medicine

## 2019-11-07 DIAGNOSIS — Z23 Encounter for immunization: Secondary | ICD-10-CM

## 2019-11-07 NOTE — Progress Notes (Signed)
   Covid-19 Vaccination Clinic  Name:  Craig Holden    MRN: [id number] DOB: 1956/04/02  11/07/2019  [name] was observed post Covid-19 immunization for 15 minutes without incident. He was provided with Vaccine Information Sheet and instruction to access the V-Safe system.   [name] was instructed to call 911 with any severe reactions post vaccine: Marland Kitchen Difficulty breathing  . Swelling of face and throat  . A fast heartbeat  . A bad rash all over body  . Dizziness and weakness   Immunizations Administered    Name Date Dose VIS Date Route   Pfizer COVID-19 Vaccine 11/07/2019  8:30 AM 0.3 mL 07/24/2018 Intramuscular   Manufacturer: [employer], [employer]   Lot: [id number]   NDC: [address]-2

## 2019-11-28 ENCOUNTER — Ambulatory Visit: Payer: No Typology Code available for payment source | Attending: Internal Medicine

## 2019-11-28 DIAGNOSIS — Z23 Encounter for immunization: Secondary | ICD-10-CM

## 2019-11-28 NOTE — Progress Notes (Signed)
   Covid-19 Vaccination Clinic  Name:  Craig Holden    MRN: [id number] DOB: [birth year]  11/28/2019  [name] was observed post Covid-19 immunization for 15 minutes without incident. He was provided with Vaccine Information Sheet and instruction to access the V-Safe system.   [name] was instructed to call 911 with any severe reactions post vaccine: Marland Kitchen Difficulty breathing  . Swelling of face and throat  . A fast heartbeat  . A bad rash all over body  . Dizziness and weakness   Immunizations Administered    Name Date Dose VIS Date Route   Pfizer COVID-19 Vaccine 11/28/2019  8:14 AM 0.3 mL 07/24/2018 Intramuscular   Manufacturer: [employer], [employer]   Lot: [id number]   NDC: [address]-4

## 2020-12-09 ENCOUNTER — Other Ambulatory Visit: Payer: Self-pay

## 2020-12-09 ENCOUNTER — Encounter (HOSPITAL_COMMUNITY): Payer: Self-pay

## 2020-12-09 ENCOUNTER — Emergency Department (HOSPITAL_COMMUNITY): Payer: No Typology Code available for payment source

## 2020-12-09 ENCOUNTER — Emergency Department (HOSPITAL_COMMUNITY)
Admission: EM | Admit: 2020-12-09 | Discharge: 2020-12-09 | Disposition: A | Discharge status: Home / Self Care | Payer: No Typology Code available for payment source | Attending: Emergency Medicine | Admitting: Emergency Medicine

## 2020-12-09 DIAGNOSIS — I10 Essential (primary) hypertension: Secondary | ICD-10-CM | POA: Insufficient documentation

## 2020-12-09 DIAGNOSIS — M79601 Pain in right arm: Secondary | ICD-10-CM | POA: Diagnosis present

## 2020-12-09 DIAGNOSIS — F17210 Nicotine dependence, cigarettes, uncomplicated: Secondary | ICD-10-CM | POA: Diagnosis not present

## 2020-12-09 DIAGNOSIS — Z79899 Other long term (current) drug therapy: Secondary | ICD-10-CM | POA: Insufficient documentation

## 2020-12-09 DIAGNOSIS — Z7902 Long term (current) use of antithrombotics/antiplatelets: Secondary | ICD-10-CM | POA: Insufficient documentation

## 2020-12-09 MED ORDER — ACETAMINOPHEN 325 MG PO TABS
650.0000 mg | ORAL_TABLET | Freq: Once | ORAL | Status: AC
Start: 2020-12-09 — End: 2020-12-09
  Administered 2020-12-09: 650 mg via ORAL
  Filled 2020-12-09: qty 2

## 2020-12-09 NOTE — Discharge Instructions (Addendum)
EKG showed no evidence of any cardiac issue.  Chest x-ray shows no infectious process.  Overall suspect that this is a muscle related pain.  Recommend 650 mg of Tylenol every 6 hours as needed for pain.  Follow-up with your primary care doctor about your tongue lesion as well.  No concern for stroke.

## 2020-12-09 NOTE — ED Provider Notes (Signed)
[facility] EMERGENCY DEPARTMENT Provider Note   CSN: [id number] Arrival date & time: 12/09/20  0735     History Chief Complaint  Patient presents with   Arm Pain    Craig Holden is a 65 y.o. male.  The history is provided by the patient.  Illness Location:  Right arm Severity:  Mild Onset quality:  Gradual Duration:  2 days Timing:  Constant Progression:  Unchanged Chronicity:  New Context:  Pain from right elbow up into right shoulder, axilla Relieved by:  Nothing Worsened by:  Movement Associated symptoms: no abdominal pain, no chest pain, no cough, no fatigue, no fever, no loss of consciousness and no shortness of breath       Past Medical History:  Diagnosis Date   CVA (cerebral infarction)    Depression    Hypertension    Nerve damage    PTSD (post-traumatic stress disorder)     Patient Active Problem List   Diagnosis Date Noted   Occlusion of left internal carotid artery, s/p revascularization 04/21/2015   Stroke (cerebrum) (HCC) - L MCA embolic d/t L ICA occlusion    HLD (hyperlipidemia)    Tobacco use disorder    Marijuana abuse    Preoperative evaluation to rule out surgical contraindication 01/01/2014   Essential hypertension 01/01/2014   History of stroke 01/01/2014    Past Surgical History:  Procedure Laterality Date   ANTERIOR CERVICAL DECOMPRESSION/DISCECTOMY FUSION 4 LEVELS  01/20/2015   C4-C7   BACK SURGERY     HEMORROIDECTOMY     HIP SURGERY     RADIOLOGY WITH ANESTHESIA N/A 04/18/2015   Procedure: RADIOLOGY WITH ANESTHESIA;  Surgeon: Julieanne Cotton, MD;  Location: [facility] OR;  Service: Radiology;  Laterality: N/A;       Family History  Problem Relation Age of Onset   Heart attack Father    Emphysema Father    Hypertension Mother    Diabetes Mother     Social History   Tobacco Use   Smoking status: Every Day    Packs/day: 1.00    Pack years: 0.00    Types: Cigarettes   Smokeless tobacco: Never   Substance Use Topics   Alcohol use: No   Drug use: No    Types: Marijuana    Comment: denies    Home Medications Prior to Admission medications   Medication Sig Start Date End Date Taking? Authorizing Provider  albuterol (PROVENTIL HFA;VENTOLIN HFA) 108 (90 BASE) MCG/ACT inhaler Inhale 1-2 puffs into the lungs every 6 (six) hours as needed for wheezing. 06/12/11   Palumbo, April, MD  clopidogrel (PLAVIX) 75 MG tablet Take 1 tablet (75 mg total) by mouth daily with breakfast. 04/21/15   Layne Benton, NP  gabapentin (NEURONTIN) 800 MG tablet Take 800 mg by mouth 3 (three) times daily.    [provider]  hydrochlorothiazide (HYDRODIURIL) 25 MG tablet Take 12.5 mg by mouth daily.     [provider]  losartan (COZAAR) 100 MG tablet Take 50 mg by mouth daily.    [provider]  ondansetron (ZOFRAN ODT) 4 MG disintegrating tablet Take 1 tablet (4 mg total) by mouth every 8 (eight) hours as needed for nausea. Patient not taking: Reported on 05/16/2018 07/02/16   Eber Hong, MD  pregabalin (LYRICA) 75 MG capsule Take 1 capsule (75 mg total) by mouth 2 (two) times daily. Patient not taking: Reported on 05/16/2018 11/16/17   Kellie Shropshire, PA-C  sildenafil (VIAGRA) 100  MG tablet Take 100 mg by mouth daily as needed for erectile dysfunction.    [provider]    Allergies    Morphine sulfate  Review of Systems   Review of Systems  Constitutional:  Negative for fatigue and fever.  Respiratory:  Negative for cough and shortness of breath.   Cardiovascular:  Negative for chest pain.  Gastrointestinal:  Negative for abdominal pain.  Musculoskeletal:  Positive for arthralgias. Negative for joint swelling.  Neurological:  Negative for loss of consciousness, weakness and numbness.   Physical Exam Updated Vital Signs  ED Triage Vitals  Enc Vitals Group     BP 12/09/20 0741 126/74     Pulse Rate 12/09/20 0741 79     Resp 12/09/20 0741 15     Temp  12/09/20 0741 97.6 F (36.4 C)     Temp Source 12/09/20 0741 Oral     SpO2 12/09/20 0741 100 %     Weight --      Height --      Head Circumference --      Peak Flow --      Pain Score 12/09/20 0740 5     Pain Loc --      Pain Edu? --      Excl. in GC? --      Physical Exam Constitutional:      General: He is not in acute distress.    Appearance: He is not ill-appearing.  HENT:     Mouth/Throat:     Mouth: Mucous membranes are moist.  Eyes:     Pupils: Pupils are equal, round, and reactive to light.  Cardiovascular:     Pulses: Normal pulses.     Heart sounds: Normal heart sounds.  Pulmonary:     Effort: Pulmonary effort is normal. No respiratory distress.     Breath sounds: No stridor. No wheezing or rhonchi.  Musculoskeletal:        General: No swelling, tenderness or deformity. Normal range of motion.     Comments: Patient has some discomfort with flexion extension at the right elbow  Skin:    General: Skin is warm.     Capillary Refill: Capillary refill takes less than 2 seconds.  Neurological:     General: No focal deficit present.     Mental Status: He is alert. Mental status is at baseline.     Sensory: No sensory deficit.     Comments: Has 4+ out of 5 strength in his right upper extremity which is baseline per patient from prior stroke, 5+ out of 5 strength in the left upper extremity, normal sensation    ED Results / Procedures / Treatments   Labs (all labs ordered are listed, but only abnormal results are displayed) Labs Reviewed - No data to display  EKG EKG Interpretation  Date/Time:  Wednesday December 09 2020 08:15:52 EDT Ventricular Rate:  60 PR Interval:  172 QRS Duration: 80 QT Interval:  402 QTC Calculation: 402 R Axis:   80 Text Interpretation: Normal sinus rhythm Anteroseptal infarct , age undetermined Abnormal ECG Confirmed by Virgina Norfolk [phone]) on 12/09/2020 8:22:15 AM  Radiology DG Chest 2 View  Result Date: 12/09/2020 CLINICAL DATA:   65 year old male with history of right arm pain for the past 2-3 days. EXAM: CHEST - 2 VIEW COMPARISON:  Chest x-ray 04/27/2016. FINDINGS: Lung volumes are normal. No consolidative airspace disease. No pleural effusions. No pneumothorax. No pulmonary nodule or mass noted. Pulmonary vasculature and  the cardiomediastinal silhouette are within normal limits. IMPRESSION: No radiographic evidence of acute cardiopulmonary disease. Electronically Signed   By: Trudie Reed M.D.   On: 12/09/2020 09:00    Procedures Procedures   Medications Ordered in ED Medications  acetaminophen (TYLENOL) tablet 650 mg (650 mg Oral Given 12/09/20 3646)    ED Course  I have reviewed the triage vital signs and the nursing notes.  Pertinent labs & imaging results that were available during my care of the patient were reviewed by me and considered in my medical decision making (see chart for details).    MDM Rules/Calculators/A&P                          Craig Holden is here with right arm pain.  History of stroke with right-sided deficits.  Normal vitals.  No fever.  Denies any specific trauma.  Pain mostly in the right elbow that radiates up into the right axilla.  Pain with range of motion of the right elbow but there is no swelling, no deformity, no focal tenderness.  Suspect likely something musculoskeletal.  No active chest pain or shortness of breath.  Clear breath sounds.  Chest x-ray without evidence of infection or pneumothorax.  EKG shows sinus rhythm with no ischemic changes.  Doubt ACS.  Overall neurologically he is intact and at his baseline.  No concern for stroke.  Recommend Tylenol, ibuprofen, follow-up with primary care doctor.  Patient also having to talk about a little bump that he had on the tip of his tongue that did not appear to be infectious or any type of emergent process.  Recommend he follow-up with primary care doctor to discuss possibly getting a biopsy.  This chart was dictated using  voice recognition software.  Despite best efforts to proofread,  errors can occur which can change the documentation meaning.   Final Clinical Impression(s) / ED Diagnoses Final diagnoses:  Right arm pain    Rx / DC Orders ED Discharge Orders     None        Virgina Norfolk, DO 12/09/20 8032

## 2020-12-09 NOTE — ED Notes (Signed)
Patient transported to x-ray. ?

## 2020-12-09 NOTE — ED Triage Notes (Addendum)
Patient complains of right arm pain intermittent x 2 days. Denies trauma, pain worse with ROM. No obvious weakness, denies numbness. Alert and oriented No arm drift, grip weaker on right but patient reports due to previous stroke

## 2021-01-21 ENCOUNTER — Observation Stay (HOSPITAL_COMMUNITY): Payer: Medicare Other

## 2021-01-21 ENCOUNTER — Emergency Department (HOSPITAL_COMMUNITY): Payer: Medicare Other

## 2021-01-21 ENCOUNTER — Encounter (HOSPITAL_COMMUNITY): Payer: Self-pay

## 2021-01-21 ENCOUNTER — Inpatient Hospital Stay (HOSPITAL_COMMUNITY)
Admission: EM | Admit: 2021-01-21 | Discharge: 2021-01-23 | DRG: 101 | Disposition: A | Discharge status: Home / Self Care | Payer: Medicare Other | Attending: Internal Medicine | Admitting: Internal Medicine

## 2021-01-21 ENCOUNTER — Other Ambulatory Visit: Payer: Self-pay

## 2021-01-21 DIAGNOSIS — Z8673 Personal history of transient ischemic attack (TIA), and cerebral infarction without residual deficits: Secondary | ICD-10-CM

## 2021-01-21 DIAGNOSIS — F17200 Nicotine dependence, unspecified, uncomplicated: Secondary | ICD-10-CM | POA: Diagnosis present

## 2021-01-21 DIAGNOSIS — I69398 Other sequelae of cerebral infarction: Secondary | ICD-10-CM

## 2021-01-21 DIAGNOSIS — Z20822 Contact with and (suspected) exposure to covid-19: Secondary | ICD-10-CM | POA: Diagnosis present

## 2021-01-21 DIAGNOSIS — Z833 Family history of diabetes mellitus: Secondary | ICD-10-CM

## 2021-01-21 DIAGNOSIS — I10 Essential (primary) hypertension: Secondary | ICD-10-CM | POA: Diagnosis present

## 2021-01-21 DIAGNOSIS — Z79899 Other long term (current) drug therapy: Secondary | ICD-10-CM

## 2021-01-21 DIAGNOSIS — Z825 Family history of asthma and other chronic lower respiratory diseases: Secondary | ICD-10-CM

## 2021-01-21 DIAGNOSIS — F17210 Nicotine dependence, cigarettes, uncomplicated: Secondary | ICD-10-CM | POA: Diagnosis present

## 2021-01-21 DIAGNOSIS — Z885 Allergy status to narcotic agent status: Secondary | ICD-10-CM

## 2021-01-21 DIAGNOSIS — R569 Unspecified convulsions: Principal | ICD-10-CM

## 2021-01-21 DIAGNOSIS — Z981 Arthrodesis status: Secondary | ICD-10-CM

## 2021-01-21 DIAGNOSIS — E872 Acidosis: Secondary | ICD-10-CM | POA: Diagnosis present

## 2021-01-21 DIAGNOSIS — R7989 Other specified abnormal findings of blood chemistry: Secondary | ICD-10-CM | POA: Diagnosis present

## 2021-01-21 DIAGNOSIS — Z7902 Long term (current) use of antithrombotics/antiplatelets: Secondary | ICD-10-CM

## 2021-01-21 DIAGNOSIS — E785 Hyperlipidemia, unspecified: Secondary | ICD-10-CM | POA: Diagnosis present

## 2021-01-21 DIAGNOSIS — Z8249 Family history of ischemic heart disease and other diseases of the circulatory system: Secondary | ICD-10-CM

## 2021-01-21 LAB — URINALYSIS, ROUTINE W REFLEX MICROSCOPIC
Bilirubin Urine: NEGATIVE
Glucose, UA: NEGATIVE mg/dL
Hgb urine dipstick: NEGATIVE
Ketones, ur: NEGATIVE mg/dL
Leukocytes,Ua: NEGATIVE
Nitrite: NEGATIVE
Protein, ur: 30 mg/dL — AB
Specific Gravity, Urine: 1.02 (ref 1.005–1.030)
pH: 5 (ref 5.0–8.0)

## 2021-01-21 LAB — RAPID URINE DRUG SCREEN, HOSP PERFORMED
Amphetamines: NOT DETECTED
Barbiturates: NOT DETECTED
Benzodiazepines: POSITIVE — AB
Cocaine: NOT DETECTED
Opiates: NOT DETECTED
Tetrahydrocannabinol: POSITIVE — AB

## 2021-01-21 LAB — COMPREHENSIVE METABOLIC PANEL
ALT: 9 U/L (ref 0–44)
AST: 16 U/L (ref 15–41)
Albumin: 3.6 g/dL (ref 3.5–5.0)
Alkaline Phosphatase: 86 U/L (ref 38–126)
Anion gap: 9 (ref 5–15)
BUN: 9 mg/dL (ref 8–23)
CO2: 24 mmol/L (ref 22–32)
Calcium: 9.3 mg/dL (ref 8.9–10.3)
Chloride: 105 mmol/L (ref 98–111)
Creatinine, Ser: 1.1 mg/dL (ref 0.61–1.24)
GFR, Estimated: >60 mL/min (ref >60)
Glucose, Bld: 98 mg/dL (ref 70–99)
Potassium: 3.9 mmol/L (ref 3.5–5.1)
Sodium: 138 mmol/L (ref 135–145)
Total Bilirubin: 0.9 mg/dL (ref 0.3–1.2)
Total Protein: 6.7 g/dL (ref 6.5–8.1)

## 2021-01-21 LAB — CBC WITH DIFFERENTIAL/PLATELET
Abs Immature Granulocytes: 0.04 10*3/uL (ref 0.00–0.07)
Basophils Absolute: 0 10*3/uL (ref 0.0–0.1)
Basophils Relative: 0 %
Eosinophils Absolute: 0 10*3/uL (ref 0.0–0.5)
Eosinophils Relative: 0 %
HCT: 43 % (ref 39.0–52.0)
Hemoglobin: 14.7 g/dL (ref 13.0–17.0)
Immature Granulocytes: 1 %
Lymphocytes Relative: 17 %
Lymphs Abs: 1.2 10*3/uL (ref 0.7–4.0)
MCH: 32.2 pg (ref 26.0–34.0)
MCHC: 34.2 g/dL (ref 30.0–36.0)
MCV: 94.1 fL (ref 80.0–100.0)
Monocytes Absolute: 0.6 10*3/uL (ref 0.1–1.0)
Monocytes Relative: 9 %
Neutro Abs: 5.1 10*3/uL (ref 1.7–7.7)
Neutrophils Relative %: 73 %
Platelets: 197 10*3/uL (ref 150–400)
RBC: 4.57 MIL/uL (ref 4.22–5.81)
RDW: 14.4 % (ref 11.5–15.5)
WBC: 6.9 10*3/uL (ref 4.0–10.5)
nRBC: 0 % (ref 0.0–0.2)

## 2021-01-21 LAB — TROPONIN I (HIGH SENSITIVITY)
Troponin I (High Sensitivity): 28 ng/L — ABNORMAL HIGH (ref <18)
Troponin I (High Sensitivity): 39 ng/L — ABNORMAL HIGH (ref <18)

## 2021-01-21 LAB — LACTIC ACID, PLASMA
Lactic Acid, Venous: 2.5 mmol/L (ref 0.5–1.9)
Lactic Acid, Venous: 1.3 mmol/L (ref 0.5–1.9)

## 2021-01-21 LAB — CBG MONITORING, ED: Glucose-Capillary: 100 mg/dL — ABNORMAL HIGH (ref 70–99)

## 2021-01-21 LAB — RESP PANEL BY RT-PCR (FLU A&B, COVID) ARPGX2
Influenza A by PCR: NEGATIVE
Influenza B by PCR: NEGATIVE
SARS Coronavirus 2 by RT PCR: NEGATIVE

## 2021-01-21 MED ORDER — LORAZEPAM 2 MG/ML IJ SOLN: 1.0000 mg | INTRAMUSCULAR | Status: DC | PRN

## 2021-01-21 MED ORDER — LORAZEPAM 2 MG/ML IJ SOLN: 1.0000 mg | Freq: Once | INTRAMUSCULAR | Status: DC

## 2021-01-21 MED ORDER — LEVETIRACETAM 500 MG PO TABS
500.0000 mg | ORAL_TABLET | Freq: Two times a day (BID) | ORAL | Status: DC
  Administered 2021-01-22 – 2021-01-23 (×3): 500 mg via ORAL
  Filled 2021-01-21 (×3): qty 1

## 2021-01-21 MED ORDER — LORAZEPAM 2 MG/ML IJ SOLN
4.0000 mg | INTRAMUSCULAR | Status: AC | PRN
Start: 2021-01-21 — End: 2021-01-21
  Administered 2021-01-21 (×2): 4 mg via INTRAVENOUS
  Filled 2021-01-21 (×2): qty 2

## 2021-01-21 MED ORDER — CLOPIDOGREL BISULFATE 75 MG PO TABS
75.0000 mg | ORAL_TABLET | Freq: Every day | ORAL | Status: DC
  Administered 2021-01-22 – 2021-01-23 (×2): 75 mg via ORAL
  Filled 2021-01-21 (×2): qty 1

## 2021-01-21 MED ORDER — ATORVASTATIN CALCIUM 10 MG PO TABS
20.0000 mg | ORAL_TABLET | Freq: Every day | ORAL | Status: DC
  Administered 2021-01-22 – 2021-01-23 (×2): 20 mg via ORAL
  Filled 2021-01-21 (×2): qty 2

## 2021-01-21 MED ORDER — ALBUTEROL SULFATE HFA 108 (90 BASE) MCG/ACT IN AERS
1.0000 | INHALATION_SPRAY | Freq: Four times a day (QID) | RESPIRATORY_TRACT | Status: DC | PRN
  Filled 2021-01-21: qty 6.7

## 2021-01-21 MED ORDER — ENOXAPARIN SODIUM 40 MG/0.4ML IJ SOSY
40.0000 mg | PREFILLED_SYRINGE | INTRAMUSCULAR | Status: DC
  Administered 2021-01-22 – 2021-01-23 (×2): 40 mg via SUBCUTANEOUS
  Filled 2021-01-21 (×2): qty 0.4

## 2021-01-21 MED ORDER — HYDROCHLOROTHIAZIDE 25 MG PO TABS
12.5000 mg | ORAL_TABLET | Freq: Every day | ORAL | Status: DC
  Administered 2021-01-22 – 2021-01-23 (×2): 12.5 mg via ORAL
  Filled 2021-01-21 (×2): qty 1

## 2021-01-21 MED ORDER — LORAZEPAM 2 MG/ML IJ SOLN
2.0000 mg | Freq: Once | INTRAMUSCULAR | Status: AC
  Administered 2021-01-21: 2 mg via INTRAVENOUS
  Filled 2021-01-21: qty 1

## 2021-01-21 MED ORDER — SODIUM CHLORIDE 0.9 % IV SOLN: INTRAVENOUS | Status: DC

## 2021-01-21 MED ORDER — LEVETIRACETAM 500 MG/5ML IV SOLN
2000.0000 mg | Freq: Once | INTRAVENOUS | Status: AC
Start: 2021-01-21 — End: 2021-01-22
  Administered 2021-01-22: 2000 mg via INTRAVENOUS
  Filled 2021-01-21 (×2): qty 20

## 2021-01-21 MED ORDER — LOSARTAN POTASSIUM 50 MG PO TABS
50.0000 mg | ORAL_TABLET | Freq: Every day | ORAL | Status: DC
  Administered 2021-01-22 – 2021-01-23 (×2): 50 mg via ORAL
  Filled 2021-01-21 (×2): qty 1

## 2021-01-21 NOTE — Consult Note (Signed)
NEURO HOSPITALIST CONSULT NOTE   Requesting physician: Dr. Toniann Fail  Reason for Consult: New onset seizures x 2  History obtained from:   Chart     HPI:                                                                                                                                          Craig Holden is an 65 y.o. male with a PMHx of left MCA CVA, depression, HTN, PTSD and ACDF who presents from home after having had a seizure. His wife stated that he fell to the ground shaking and that it lasted for 5-6 minutes. The patient and wife were in the same room at the time of seizure onset. Wife heard gurgling sounds and a thump. She found that he had fallen face forward onto the floor, and she noticed generalized, tonic-clonic movements. The patient was combative when EMS arrived. After their arrival, the patient had another seizure that lasted about 90 seconds, for which he was administered 5 mg of Versed. He has had no further seizures, but is postictal. He has no prior history of seizures. At baseline he is conversant and ambulates on his own, but has mild balance difficulty related to his prior stroke.   In the ED, tongue bite was noted.   ED labs revealed an elevated lactate. CT showed an old left MCA stroke, with no acute abnormality.   Past Medical History:  Diagnosis Date   CVA (cerebral infarction)    Depression    Hypertension    Nerve damage    PTSD (post-traumatic stress disorder)     Past Surgical History:  Procedure Laterality Date   ANTERIOR CERVICAL DECOMPRESSION/DISCECTOMY FUSION 4 LEVELS  01/20/2015   C4-C7   BACK SURGERY     HEMORROIDECTOMY     HIP SURGERY     RADIOLOGY WITH ANESTHESIA N/A 04/18/2015   Procedure: RADIOLOGY WITH ANESTHESIA;  Surgeon: Julieanne Cotton, MD;  Location: [facility] OR;  Service: Radiology;  Laterality: N/A;    Family History  Problem Relation Age of Onset   Heart attack Father    Emphysema Father    Hypertension  Mother    Diabetes Mother            Social History:  reports that he has been smoking cigarettes. He has been smoking an average of 1 pack per day. He has never used smokeless tobacco. He reports that he does not drink alcohol and does not use drugs.  Allergies  Allergen Reactions   Morphine Sulfate Nausea And Vomiting    MEDICATIONS:  No current facility-administered medications on file prior to encounter.   Current Outpatient Medications on File Prior to Encounter  Medication Sig Dispense Refill   albuterol (PROVENTIL HFA;VENTOLIN HFA) 108 (90 BASE) MCG/ACT inhaler Inhale 1-2 puffs into the lungs every 6 (six) hours as needed for wheezing. 1 Inhaler 0   atorvastatin (LIPITOR) 20 MG tablet Take 20 mg by mouth daily.     clopidogrel (PLAVIX) 75 MG tablet Take 1 tablet (75 mg total) by mouth daily with breakfast. 30 tablet 2   hydrochlorothiazide (HYDRODIURIL) 25 MG tablet Take 12.5 mg by mouth daily.      losartan (COZAAR) 100 MG tablet Take 50 mg by mouth daily.     ondansetron (ZOFRAN ODT) 4 MG disintegrating tablet Take 1 tablet (4 mg total) by mouth every 8 (eight) hours as needed for nausea. (Patient not taking: No sig reported) 10 tablet 0   pregabalin (LYRICA) 75 MG capsule Take 1 capsule (75 mg total) by mouth 2 (two) times daily. (Patient not taking: No sig reported) 60 capsule 0     ROS:                                                                                                                                       Unable to obtain due to postictal state.    Blood pressure 132/79, pulse 98, temperature 98.2 F (36.8 C), temperature source Oral, resp. rate 18, SpO2 96 %.   General Examination:                                                                                                       Physical Exam  HEENT-  [place]/AT  Lungs- Respirations  unlabored Extremities- No edema  Neurological Examination Mental Status: Confused and agitated with some movements but non cooperative and not following commands or answering questions. Initially was stating with fluent speech that he wanted to go out and smoke.  Cranial Nerves: Eyes closed tightly and will not open for assessment of pupils or tracking. Face grossly symmetric.  Motor: Right side somewhat weaker than left when agitated and moving spontaneously, resisting staff. Unable to formally assess due to not following commands.  No clinical seizure activity appreciated.  Sensory: Unable to formally assess Deep Tendon Reflexes: Not cooperative Cerebellar/Gait: Unable to assess   Lab Results: Basic Metabolic Panel: Recent Labs  Lab 01/21/21 1646  NA 138  K 3.9  CL 105  CO2 24  GLUCOSE 98  BUN 9  CREATININE 1.10  CALCIUM 9.3    CBC: Recent Labs  Lab 01/21/21 1646  WBC 6.9  NEUTROABS 5.1  HGB 14.7  HCT 43.0  MCV 94.1  PLT 197    Cardiac Enzymes: No results for input(s): CKTOTAL, CKMB, CKMBINDEX, TROPONINI in the last 168 hours.  Lipid Panel: No results for input(s): CHOL, TRIG, HDL, CHOLHDL, VLDL, LDLCALC in the last 168 hours.  Imaging: CT HEAD WO CONTRAST  Result Date: 01/21/2021 CLINICAL DATA:  Seizure EXAM: CT HEAD WITHOUT CONTRAST TECHNIQUE: Contiguous axial images were obtained from the base of the skull through the vertex without intravenous contrast. COMPARISON:  10/21/2015 FINDINGS: Brain: Probable old left MCA infarct in the insular region with encephalomalacia. No acute intracranial abnormality. Specifically, no hemorrhage, hydrocephalus, mass lesion, acute infarction, or significant intracranial injury. Vascular: No hyperdense vessel or unexpected calcification. Skull: No acute calvarial abnormality. Sinuses/Orbits: No acute findings Other: None IMPRESSION: Old left MCA infarct with encephalomalacia. No acute intracranial abnormality. Electronically  Signed   By: Charlett Nose M.D.   On: 01/21/2021 17:39   DG Chest Port 1 View  Result Date: 01/21/2021 CLINICAL DATA:  Recent seizure activity, initial encounter EXAM: PORTABLE CHEST 1 VIEW COMPARISON:  12/09/2020 FINDINGS: Cardiac shadow is stable. Lungs are well aerated bilaterally. No focal infiltrate or sizable effusion is seen. Postsurgical changes in the cervical spine are noted. IMPRESSION: No active disease. Electronically Signed   By: Alcide Clever M.D.   On: 01/21/2021 19:55     Assessment: 65 year old male with prior left MCA stroke presenting with new onset seizures x 2. He received 5 mg Versed in the field.  1. Exam reveals mild left sided weakness in the context of poor cooperation due to postictal state.  2. CT head: Old cortically-based left MCA infarct with encephalomalacia. No acute intracranial abnormality. 3. Most likely etiology for new onset seizures is pre-existing potentially epileptogenic lesion, as evidenced by the old stroke seen on CT. Treatment of first time seizure with long-term anticonvulsants is indicated if EEG is positive or if there is an underlying lesion on imaging.  4. Na and Ca normal. Renal labs normal. WBC normal. UTox positive for THC.   Recommendations: 1. STAT EEG 2. STAT MRI brain with and without contrast 3. Frequent neuro checks 4. Keppra 2000 mg has been loaded.  5. Continue Keppra at 500 mg po BID.  6. Inpatient seizure precautions.  7. Outpatient seizure precautions: Per [facility] statutes, patients with seizures are not allowed to drive until  they have been seizure-free for six months. Use caution when using heavy equipment or power tools. Avoid working on ladders or at heights. Take showers instead of baths. Ensure the water temperature is not too high on the home water heater. Do not go swimming alone. When caring for infants or small children, sit down when holding, feeding, or changing them to minimize risk of injury to the child in  the event you have a seizure. Also, Maintain good sleep hygiene. Avoid alcohol.   Electronically signed: Dr. Caryl Pina 01/21/2021, 8:48 PM

## 2021-01-21 NOTE — ED Triage Notes (Signed)
Pt BIB [facility] from home c/o a seizure. Pt's wife states that he fell to the ground shaking and it lasted for 2-3 mins. Pt was combative when EMS got there. Once ems arrived pt had another seizure that lasted about 90 secs. Pt received 5 of versed.

## 2021-01-21 NOTE — Progress Notes (Signed)
STAT EEG complete - results pending. ? ?

## 2021-01-21 NOTE — Progress Notes (Signed)
Received report from Loistine Simas, RN of ED.

## 2021-01-21 NOTE — ED Provider Notes (Signed)
[facility] EMERGENCY DEPARTMENT Provider Note   CSN: [id number] Arrival date & time: 01/21/21  1614     History Chief Complaint  Patient presents with   Seizures    Craig Holden is a 65 y.o. male.  Craig Holden was lying on the couch this afternoon.  This morning he had awoken and was complaining with some abdominal pain.  His wife states that she thought it was secondary to constipation.  She was in the same room as he, and she heard a gurgling sounds and a thump.  She found that he had fallen face forward onto the floor, and she noticed generalized, tonic-clonic motions.  His usual baseline is ambulatory and conversive with some mild balance problems related to a prior CVA.  The history is provided by the spouse. The history is limited by the condition of the patient.  Seizures Seizure activity on arrival: no   Seizure type:  Grand mal Preceding symptoms comment:  None known Initial focality:  None Episode characteristics: abnormal movements, combativeness, confusion, generalized shaking and partial responsiveness   Episode characteristics: no incontinence and no tongue biting   Postictal symptoms: confusion   Return to baseline: no   Severity:  Severe Duration: one that lasted 5-6 minutes, one that lasted 90 seconds. Timing:  Clustered (2) Number of seizures this episode:  2 Progression:  Improving Context comment:  No history of seizure Recent head injury:  No recent head injuries PTA treatment:  Midazolam History of seizures: no       Past Medical History:  Diagnosis Date   CVA (cerebral infarction)    Depression    Hypertension    Nerve damage    PTSD (post-traumatic stress disorder)     Patient Active Problem List   Diagnosis Date Noted   Occlusion of left internal carotid artery, s/p revascularization 04/21/2015   Stroke (cerebrum) (HCC) - L MCA embolic d/t L ICA occlusion    HLD (hyperlipidemia)    Tobacco use disorder     Marijuana abuse    Preoperative evaluation to rule out surgical contraindication 01/01/2014   Essential hypertension 01/01/2014   History of stroke 01/01/2014    Past Surgical History:  Procedure Laterality Date   ANTERIOR CERVICAL DECOMPRESSION/DISCECTOMY FUSION 4 LEVELS  01/20/2015   C4-C7   BACK SURGERY     HEMORROIDECTOMY     HIP SURGERY     RADIOLOGY WITH ANESTHESIA N/A 04/18/2015   Procedure: RADIOLOGY WITH ANESTHESIA;  Surgeon: Julieanne Cotton, MD;  Location: [facility] OR;  Service: Radiology;  Laterality: N/A;       Family History  Problem Relation Age of Onset   Heart attack Father    Emphysema Father    Hypertension Mother    Diabetes Mother     Social History   Tobacco Use   Smoking status: Every Day    Packs/day: 1.00    Types: Cigarettes   Smokeless tobacco: Never  Substance Use Topics   Alcohol use: No   Drug use: No    Types: Marijuana    Comment: denies    Home Medications Prior to Admission medications   Medication Sig Start Date End Date Taking? Authorizing Provider  albuterol (PROVENTIL HFA;VENTOLIN HFA) 108 (90 BASE) MCG/ACT inhaler Inhale 1-2 puffs into the lungs every 6 (six) hours as needed for wheezing. 06/12/11   Palumbo, April, MD  clopidogrel (PLAVIX) 75 MG tablet Take 1 tablet (75 mg total) by mouth daily with breakfast. 04/21/15  Layne Benton, NP  gabapentin (NEURONTIN) 800 MG tablet Take 800 mg by mouth 3 (three) times daily.    [provider]  hydrochlorothiazide (HYDRODIURIL) 25 MG tablet Take 12.5 mg by mouth daily.     [provider]  losartan (COZAAR) 100 MG tablet Take 50 mg by mouth daily.    [provider]  ondansetron (ZOFRAN ODT) 4 MG disintegrating tablet Take 1 tablet (4 mg total) by mouth every 8 (eight) hours as needed for nausea. Patient not taking: Reported on 05/16/2018 07/02/16   Eber Hong, MD  pregabalin (LYRICA) 75 MG capsule Take 1 capsule (75 mg total) by mouth 2 (two) times  daily. Patient not taking: Reported on 05/16/2018 11/16/17   Kellie Shropshire, PA-C  sildenafil (VIAGRA) 100 MG tablet Take 100 mg by mouth daily as needed for erectile dysfunction.    [provider]    Allergies    Morphine sulfate  Review of Systems   Review of Systems  Unable to perform ROS: Mental status change  Gastrointestinal:  Positive for abdominal pain.  Neurological:  Positive for seizures.   Physical Exam Updated Vital Signs BP (!) 174/143   Pulse 85   Temp 98.2 F (36.8 C) (Oral)   Resp 18   SpO2 90%   Physical Exam Vitals and nursing note reviewed.  Constitutional:      Appearance: He is ill-appearing.  HENT:     Head: Normocephalic and atraumatic.  Eyes:     Pupils: Pupils are equal.  Cardiovascular:     Rate and Rhythm: Normal rate and regular rhythm.     Heart sounds: Normal heart sounds.  Pulmonary:     Effort: Pulmonary effort is normal.     Breath sounds: Normal breath sounds.  Abdominal:     General: There is no distension.     Tenderness: There is no abdominal tenderness. There is no guarding.  Musculoskeletal:     Cervical back: Normal range of motion.     Right lower leg: No edema.     Left lower leg: No edema.  Skin:    General: Skin is warm and dry.  Neurological:     Mental Status: He is lethargic and confused.     GCS: GCS eye subscore is 1. GCS verbal subscore is 3. GCS motor subscore is 5.  Psychiatric:     Comments: Combative, altered    ED Results / Procedures / Treatments   Labs (all labs ordered are listed, but only abnormal results are displayed) Labs Reviewed  URINALYSIS, ROUTINE W REFLEX MICROSCOPIC - Abnormal; Notable for the following components:      Result Value   Protein, ur 30 (*)    Bacteria, UA RARE (*)    All other components within normal limits  RAPID URINE DRUG SCREEN, HOSP PERFORMED - Abnormal; Notable for the following components:   Benzodiazepines POSITIVE (*)    Tetrahydrocannabinol  POSITIVE (*)    All other components within normal limits  LACTIC ACID, PLASMA - Abnormal; Notable for the following components:   Lactic Acid, Venous 2.5 (*)    All other components within normal limits  CBG MONITORING, ED - Abnormal; Notable for the following components:   Glucose-Capillary 100 (*)    All other components within normal limits  TROPONIN I (HIGH SENSITIVITY) - Abnormal; Notable for the following components:   Troponin I (High Sensitivity) 28 (*)    All other components within normal limits  TROPONIN I (HIGH  SENSITIVITY) - Abnormal; Notable for the following components:   Troponin I (High Sensitivity) 39 (*)    All other components within normal limits  RESP PANEL BY RT-PCR (FLU A&B, COVID) ARPGX2  COMPREHENSIVE METABOLIC PANEL  CBC WITH DIFFERENTIAL/PLATELET  LACTIC ACID, PLASMA  HIV ANTIBODY (ROUTINE TESTING W REFLEX)  CBC  CREATININE, SERUM  BASIC METABOLIC PANEL  CBC  MAGNESIUM    EKG EKG Interpretation  Date/Time:  Thursday January 21 2021 16:34:14 EDT Ventricular Rate:  80 PR Interval:  174 QRS Duration: 85 QT Interval:  380 QTC Calculation: 439 R Axis:   88 Text Interpretation: Sinus rhythm Anterior infarct, old Minimal ST elevation, lateral leads similar to prior no acute ischemia Confirmed by Pieter PartridgeWright, Doloris Servantes ([id number]) on 01/21/2021 5:07:52 PM  Radiology CT HEAD WO CONTRAST  Result Date: 01/21/2021 CLINICAL DATA:  Seizure EXAM: CT HEAD WITHOUT CONTRAST TECHNIQUE: Contiguous axial images were obtained from the base of the skull through the vertex without intravenous contrast. COMPARISON:  10/21/2015 FINDINGS: Brain: Probable old left MCA infarct in the insular region with encephalomalacia. No acute intracranial abnormality. Specifically, no hemorrhage, hydrocephalus, mass lesion, acute infarction, or significant intracranial injury. Vascular: No hyperdense vessel or unexpected calcification. Skull: No acute calvarial abnormality. Sinuses/Orbits: No acute  findings Other: None IMPRESSION: Old left MCA infarct with encephalomalacia. No acute intracranial abnormality. Electronically Signed   By: Charlett NoseKevin  Dover M.D.   On: 01/21/2021 17:39   DG Chest Port 1 View  Result Date: 01/21/2021 CLINICAL DATA:  Recent seizure activity, initial encounter EXAM: PORTABLE CHEST 1 VIEW COMPARISON:  12/09/2020 FINDINGS: Cardiac shadow is stable. Lungs are well aerated bilaterally. No focal infiltrate or sizable effusion is seen. Postsurgical changes in the cervical spine are noted. IMPRESSION: No active disease. Electronically Signed   By: Alcide CleverMark  Lukens M.D.   On: 01/21/2021 19:55    Procedures .Critical Care  Date/Time: 01/21/2021 8:06 PM Performed by: Koleen DistanceWright, Dishawn Bhargava G, MD Authorized by: Koleen DistanceWright, Jing Howatt G, MD   Critical care provider statement:    Critical care time (minutes):  30   Critical care time was exclusive of:  Separately billable procedures and treating other patients and teaching time   Critical care was necessary to treat or prevent imminent or life-threatening deterioration of the following conditions:  CNS failure or compromise   Critical care was time spent personally by me on the following activities:  Development of treatment plan with patient or surrogate, discussions with consultants, evaluation of patient's response to treatment, examination of patient, obtaining history from patient or surrogate, ordering and performing treatments and interventions, ordering and review of radiographic studies, ordering and review of laboratory studies, pulse oximetry, re-evaluation of patient's condition and review of old charts   I assumed direction of critical care for this patient from another provider in my specialty: no     Care discussed with: admitting provider     Medications Ordered in ED Medications  0.9 %  sodium chloride infusion ( Intravenous New Bag/Given 01/21/21 1828)  LORazepam (ATIVAN) injection 4 mg (4 mg Intravenous Given 01/21/21 1705)    ED  Course  I have reviewed the triage vital signs and the nursing notes.  Pertinent labs & imaging results that were available during my care of the patient were reviewed by me and considered in my medical decision making (see chart for details).  Clinical Course as of 01/21/21 2236  Thu Jan 21, 2021  2003 DG Chest [place] [AW]  2048 I spoke with Dr.  Toniann Fail. He will admit. Requests neuro c/s. Dr. Otelia Limes was called and requests MRI with and without contrast. He has ordered EEG. [AW]  2051 Patient's wife states that he came around fully and was asking about cigarettes. He is currently asleep but calm.   [AW]    Clinical Course User Index [AW] Koleen Distance, MD   MDM Rules/Calculators/A&P                           Craig Holden presented to the hospital with syncope versus seizure.  He was noted to be quite hypertensive and was either postictal or altered.  I was suspicious for a primary neurologic problem such as a head bleed, brain tumor, or new stroke.  However, I also entertained the thought of a cardiac arrhythmia or other etiology of syncope.  He was given Ativan for continued agitation in order to obtain diagnostic studies.  Lactic acid was slightly elevated which could make seizure activity more likely than syncope.  No acute findings on neuroimaging.  ED work-up otherwise unremarkable.  He will be admitted for further evaluation and treatment. Final Clinical Impression(s) / ED Diagnoses Final diagnoses:  Seizure-like activity [facility])    Rx / DC Orders ED Discharge Orders     None        Koleen Distance, MD 01/21/21 2237

## 2021-01-21 NOTE — ED Notes (Signed)
P receieved ativan. Tried to reorient pt but was unsuccessful, pt kept trying to get off the MRI table. Helped staff get pt back onto stretcher.

## 2021-01-21 NOTE — ED Notes (Signed)
MRI contacted stating the pt was unable to stay still on table. MD contacted for Ativan order.

## 2021-01-21 NOTE — ED Notes (Signed)
Pt off the floor to MRI.

## 2021-01-21 NOTE — H&P (Signed)
History and Physical    Craig Holden:[id number] DOB: 1955-12-29 DOA: 01/21/2021  PCP: Fleet Contras, MD  Patient coming from: Home.  Chief Complaint: Seizure-like activity.  History obtained from patient's wife, ER physician.  HPI: Craig Holden is a 65 y.o. male with history of prior stroke with some mild weakness of the right side, ongoing tobacco abuse and hypertension presents to the ER after patient had a seizure-like activity at home.  Patient's wife states that patient had a seizure-like activity while sitting on the couch this evening.  Lasted for few seconds following which patient lost consciousness.  Patient did bite his tongue done.  She immediately called EMS.  EMS on arrival patient had another seizure which lasted 90 seconds.  As per the report patient had a generalized tonic-clonic seizure.  EMS gave patient Versed 5 mg and brought to the ER.  ED Course: In the ER patient was combative and agitated was given IV Ativan and CT head was done which shows old left MCA infarct.  Patient was sedated and by the time I examined patient became alert awake oriented to his name and place moving all extremities.  Lab work was largely unremarkable except for mildly elevated lactic acid which improved with fluids and mildly elevated high sensitive troponin with nonspecific EKG changes.  On-call neurologist Dr. Otelia Limes was consulted.  Patient admitted for possible new onset seizures.  COVID test negative.  Urine drug screen is positive for benzodiazepine and marijuana.  Review of Systems: As per HPI, rest all negative.   Past Medical History:  Diagnosis Date   CVA (cerebral infarction)    Depression    Hypertension    Nerve damage    PTSD (post-traumatic stress disorder)     Past Surgical History:  Procedure Laterality Date   ANTERIOR CERVICAL DECOMPRESSION/DISCECTOMY FUSION 4 LEVELS  01/20/2015   C4-C7   BACK SURGERY     HEMORROIDECTOMY     HIP SURGERY     RADIOLOGY  WITH ANESTHESIA N/A 04/18/2015   Procedure: RADIOLOGY WITH ANESTHESIA;  Surgeon: Julieanne Cotton, MD;  Location: [facility] OR;  Service: Radiology;  Laterality: N/A;     reports that he has been smoking cigarettes. He has been smoking an average of 1 pack per day. He has never used smokeless tobacco. He reports that he does not drink alcohol and does not use drugs.  Allergies  Allergen Reactions   Morphine Sulfate Nausea And Vomiting    Family History  Problem Relation Age of Onset   Heart attack Father    Emphysema Father    Hypertension Mother    Diabetes Mother     Prior to Admission medications   Medication Sig Start Date End Date Taking? Authorizing Provider  albuterol (PROVENTIL HFA;VENTOLIN HFA) 108 (90 BASE) MCG/ACT inhaler Inhale 1-2 puffs into the lungs every 6 (six) hours as needed for wheezing. 06/12/11  Yes Palumbo, April, MD  atorvastatin (LIPITOR) 20 MG tablet Take 20 mg by mouth daily.   Yes [provider]  clopidogrel (PLAVIX) 75 MG tablet Take 1 tablet (75 mg total) by mouth daily with breakfast. 04/21/15  Yes Layne Benton, NP  hydrochlorothiazide (HYDRODIURIL) 25 MG tablet Take 12.5 mg by mouth daily.    Yes [provider]  losartan (COZAAR) 100 MG tablet Take 50 mg by mouth daily.   Yes [provider]  ondansetron (ZOFRAN ODT) 4 MG disintegrating tablet Take 1 tablet (4 mg total) by mouth every 8 (eight)  hours as needed for nausea. Patient not taking: No sig reported 07/02/16   Eber HongMiller, Brian, MD  pregabalin (LYRICA) 75 MG capsule Take 1 capsule (75 mg total) by mouth 2 (two) times daily. Patient not taking: No sig reported 11/16/17   Kellie ShropshireShrosbree, Emily J, PA-C    Physical Exam: Constitutional: Moderately built and nourished. Vitals:   01/21/21 1830 01/21/21 1900 01/21/21 2000 01/21/21 2100  BP: 134/78 116/68 132/79 (!) 103/55  Pulse: 88 86 98 91  Resp: 20 19 18  (!) 21  Temp:    (!) 97.4 F (36.3 C)  TempSrc:    Oral  SpO2: 94% 96% 96%  97%   Eyes: Anicteric no pallor. ENMT: No discharge from the ears eyes nose and mouth. Neck: No mass felt.  No neck rigidity. Respiratory: No rhonchi or crepitations. Cardiovascular: S1-S2 heard. Abdomen: Soft nontender bowel sound present. Musculoskeletal: No edema. Skin: No rash. Neurologic: Alert awake oriented to his name and place moving all extremities.  Pupils are reacting to light. Psychiatric: Oriented to name and place.   Labs on Admission: I have personally reviewed following labs and imaging studies  CBC: Recent Labs  Lab 01/21/21 1646  WBC 6.9  NEUTROABS 5.1  HGB 14.7  HCT 43.0  MCV 94.1  PLT 197   Basic Metabolic Panel: Recent Labs  Lab 01/21/21 1646  NA 138  K 3.9  CL 105  CO2 24  GLUCOSE 98  BUN 9  CREATININE 1.10  CALCIUM 9.3   GFR: CrCl cannot be calculated (Unknown ideal weight.). Liver Function Tests: Recent Labs  Lab 01/21/21 1646  AST 16  ALT 9  ALKPHOS 86  BILITOT 0.9  PROT 6.7  ALBUMIN 3.6   No results for input(s): LIPASE, AMYLASE in the last 168 hours. No results for input(s): AMMONIA in the last 168 hours. Coagulation Profile: No results for input(s): INR, PROTIME in the last 168 hours. Cardiac Enzymes: No results for input(s): CKTOTAL, CKMB, CKMBINDEX, TROPONINI in the last 168 hours. BNP (last 3 results) No results for input(s): PROBNP in the last 8760 hours. HbA1C: No results for input(s): HGBA1C in the last 72 hours. CBG: Recent Labs  Lab 01/21/21 1720  GLUCAP 100*   Lipid Profile: No results for input(s): CHOL, HDL, LDLCALC, TRIG, CHOLHDL, LDLDIRECT in the last 72 hours. Thyroid Function Tests: No results for input(s): TSH, T4TOTAL, FREET4, T3FREE, THYROIDAB in the last 72 hours. Anemia Panel: No results for input(s): VITAMINB12, FOLATE, FERRITIN, TIBC, IRON, RETICCTPCT in the last 72 hours. Urine analysis:    Component Value Date/Time   COLORURINE YELLOW 01/21/2021 1825   APPEARANCEUR CLEAR 01/21/2021 1825    LABSPEC 1.020 01/21/2021 1825   PHURINE 5.0 01/21/2021 1825   GLUCOSEU NEGATIVE 01/21/2021 1825   HGBUR NEGATIVE 01/21/2021 1825   BILIRUBINUR NEGATIVE 01/21/2021 1825   KETONESUR NEGATIVE 01/21/2021 1825   PROTEINUR 30 (A) 01/21/2021 1825   UROBILINOGEN 1.0 04/11/2013 1924   NITRITE NEGATIVE 01/21/2021 1825   LEUKOCYTESUR NEGATIVE 01/21/2021 1825   Sepsis Labs: @LABRCNTIP (procalcitonin:4,lacticidven:4) ) Recent Results (from the past 240 hour(s))  Resp Panel by RT-PCR (Flu A&B, Covid) Nasopharyngeal Swab     Status: None   Collection Time: 01/21/21  5:40 PM   Specimen: Nasopharyngeal Swab; Nasopharyngeal(NP) swabs in vial transport medium  Result Value Ref Range Status   SARS Coronavirus 2 by RT PCR NEGATIVE NEGATIVE Final    Comment: (NOTE) SARS-CoV-2 target nucleic acids are NOT DETECTED.  The SARS-CoV-2 RNA is generally detectable in upper respiratory  specimens during the acute phase of infection. The lowest concentration of SARS-CoV-2 viral copies this assay can detect is 138 copies/mL. A negative result does not preclude SARS-Cov-2 infection and should not be used as the sole basis for treatment or other patient management decisions. A negative result may occur with  improper specimen collection/handling, submission of specimen other than nasopharyngeal swab, presence of viral mutation(s) within the areas targeted by this assay, and inadequate number of viral copies(<138 copies/mL). A negative result must be combined with clinical observations, patient history, and epidemiological information. The expected result is Negative.  Fact Sheet for Patients:  BloggerCourse.com  Fact Sheet for Healthcare Providers:  SeriousBroker.it  This test is no t yet approved or cleared by the [place] FDA and  has been authorized for detection and/or diagnosis of SARS-CoV-2 by FDA under an Emergency Use Authorization (EUA). This  EUA will remain  in effect (meaning this test can be used) for the duration of the COVID-19 declaration under Section 564(b)(1) of the Act, 21 U.S.C.section 360bbb-3(b)(1), unless the authorization is terminated  or revoked sooner.       Influenza A by PCR NEGATIVE NEGATIVE Final   Influenza B by PCR NEGATIVE NEGATIVE Final    Comment: (NOTE) The Xpert Xpress SARS-CoV-2/FLU/RSV plus assay is intended as an aid in the diagnosis of influenza from Nasopharyngeal swab specimens and should not be used as a sole basis for treatment. Nasal washings and aspirates are unacceptable for Xpert Xpress SARS-CoV-2/FLU/RSV testing.  Fact Sheet for Patients: BloggerCourse.com  Fact Sheet for Healthcare Providers: SeriousBroker.it  This test is not yet approved or cleared by the [place] FDA and has been authorized for detection and/or diagnosis of SARS-CoV-2 by FDA under an Emergency Use Authorization (EUA). This EUA will remain in effect (meaning this test can be used) for the duration of the COVID-19 declaration under Section 564(b)(1) of the Act, 21 U.S.C. section 360bbb-3(b)(1), unless the authorization is terminated or revoked.  Performed at [facility], [address] [place], [place] [address]      Radiological Exams on Admission: CT HEAD WO CONTRAST  Result Date: 01/21/2021 CLINICAL DATA:  Seizure EXAM: CT HEAD WITHOUT CONTRAST TECHNIQUE: Contiguous axial images were obtained from the base of the skull through the vertex without intravenous contrast. COMPARISON:  10/21/2015 FINDINGS: Brain: Probable old left MCA infarct in the insular region with encephalomalacia. No acute intracranial abnormality. Specifically, no hemorrhage, hydrocephalus, mass lesion, acute infarction, or significant intracranial injury. Vascular: No hyperdense vessel or unexpected calcification. Skull: No acute calvarial abnormality. Sinuses/Orbits: No  acute findings Other: None IMPRESSION: Old left MCA infarct with encephalomalacia. No acute intracranial abnormality. Electronically Signed   By: Charlett Nose M.D.   On: 01/21/2021 17:39   DG Chest Port 1 View  Result Date: 01/21/2021 CLINICAL DATA:  Recent seizure activity, initial encounter EXAM: PORTABLE CHEST 1 VIEW COMPARISON:  12/09/2020 FINDINGS: Cardiac shadow is stable. Lungs are well aerated bilaterally. No focal infiltrate or sizable effusion is seen. Postsurgical changes in the cervical spine are noted. IMPRESSION: No active disease. Electronically Signed   By: Alcide Clever M.D.   On: 01/21/2021 19:55    EKG: Independently reviewed.  Normal sinus rhythm with nonspecific ST changes.  Assessment/Plan Principal Problem:   Seizures (HCC) Active Problems:   Essential hypertension   History of stroke   Tobacco use disorder   Seizure (HCC)    Possible new onset seizure -discussed with on-call neurologist Dr. Otelia Limes.  Patient is  placed on Keppra 2 g loading dose and 5 mg p.o. twice daily.  EEG has been ordered MRI brain with and without contrast has been ordered.  Seizure precautions.  As needed Ativan. Hypertension on ARB and diuretics. Prior stroke on Plavix and statins. Tobacco abuse advised about quitting.   Mildly elevated high sensitive troponins.  Denies any chest pain.  Will trend cardiac markers.   DVT prophylaxis: Lovenox. Code Status: Full code. Family Communication: Patient's wife. Disposition Plan: Home. Consults called: Neurologist. Admission status: Observation.   Eduard Clos MD [facility] Pager [phone].  If 7PM-7AM, please contact night-coverage [email or web address] Password [facility]  01/21/2021, 10:15 PM

## 2021-01-22 ENCOUNTER — Observation Stay (HOSPITAL_COMMUNITY): Payer: Medicare Other

## 2021-01-22 DIAGNOSIS — Z885 Allergy status to narcotic agent status: Secondary | ICD-10-CM | POA: Diagnosis not present

## 2021-01-22 DIAGNOSIS — Z825 Family history of asthma and other chronic lower respiratory diseases: Secondary | ICD-10-CM | POA: Diagnosis not present

## 2021-01-22 DIAGNOSIS — Z833 Family history of diabetes mellitus: Secondary | ICD-10-CM | POA: Diagnosis not present

## 2021-01-22 DIAGNOSIS — E872 Acidosis: Secondary | ICD-10-CM | POA: Diagnosis present

## 2021-01-22 DIAGNOSIS — R7989 Other specified abnormal findings of blood chemistry: Secondary | ICD-10-CM | POA: Diagnosis present

## 2021-01-22 DIAGNOSIS — Z8249 Family history of ischemic heart disease and other diseases of the circulatory system: Secondary | ICD-10-CM | POA: Diagnosis not present

## 2021-01-22 DIAGNOSIS — I10 Essential (primary) hypertension: Secondary | ICD-10-CM | POA: Diagnosis present

## 2021-01-22 DIAGNOSIS — F17200 Nicotine dependence, unspecified, uncomplicated: Secondary | ICD-10-CM

## 2021-01-22 DIAGNOSIS — R569 Unspecified convulsions: Secondary | ICD-10-CM | POA: Diagnosis present

## 2021-01-22 DIAGNOSIS — Z7902 Long term (current) use of antithrombotics/antiplatelets: Secondary | ICD-10-CM | POA: Diagnosis not present

## 2021-01-22 DIAGNOSIS — Z8673 Personal history of transient ischemic attack (TIA), and cerebral infarction without residual deficits: Secondary | ICD-10-CM

## 2021-01-22 DIAGNOSIS — Z79899 Other long term (current) drug therapy: Secondary | ICD-10-CM | POA: Diagnosis not present

## 2021-01-22 DIAGNOSIS — Z20822 Contact with and (suspected) exposure to covid-19: Secondary | ICD-10-CM | POA: Diagnosis present

## 2021-01-22 DIAGNOSIS — Z981 Arthrodesis status: Secondary | ICD-10-CM | POA: Diagnosis not present

## 2021-01-22 DIAGNOSIS — I69398 Other sequelae of cerebral infarction: Secondary | ICD-10-CM | POA: Diagnosis not present

## 2021-01-22 DIAGNOSIS — E785 Hyperlipidemia, unspecified: Secondary | ICD-10-CM | POA: Diagnosis present

## 2021-01-22 DIAGNOSIS — F17210 Nicotine dependence, cigarettes, uncomplicated: Secondary | ICD-10-CM | POA: Diagnosis present

## 2021-01-22 LAB — CBC
HCT: 39.1 % (ref 39.0–52.0)
Hemoglobin: 14 g/dL (ref 13.0–17.0)
MCH: 33 pg (ref 26.0–34.0)
MCHC: 35.8 g/dL (ref 30.0–36.0)
MCV: 92.2 fL (ref 80.0–100.0)
Platelets: 186 10*3/uL (ref 150–400)
RBC: 4.24 MIL/uL (ref 4.22–5.81)
RDW: 14.2 % (ref 11.5–15.5)
WBC: 9.3 10*3/uL (ref 4.0–10.5)
nRBC: 0 % (ref 0.0–0.2)

## 2021-01-22 LAB — BASIC METABOLIC PANEL
Anion gap: 10 (ref 5–15)
BUN: 8 mg/dL (ref 8–23)
CO2: 23 mmol/L (ref 22–32)
Calcium: 9 mg/dL (ref 8.9–10.3)
Chloride: 104 mmol/L (ref 98–111)
Creatinine, Ser: 0.96 mg/dL (ref 0.61–1.24)
GFR, Estimated: >60 mL/min (ref >60)
Glucose, Bld: 99 mg/dL (ref 70–99)
Potassium: 3.5 mmol/L (ref 3.5–5.1)
Sodium: 137 mmol/L (ref 135–145)

## 2021-01-22 LAB — MAGNESIUM: Magnesium: 1.7 mg/dL (ref 1.7–2.4)

## 2021-01-22 LAB — HIV ANTIBODY (ROUTINE TESTING W REFLEX): HIV Screen 4th Generation wRfx: NONREACTIVE

## 2021-01-22 MED ORDER — LORAZEPAM 2 MG/ML IJ SOLN
2.0000 mg | Freq: Once | INTRAMUSCULAR | Status: AC
  Administered 2021-01-22: 2 mg via INTRAVENOUS

## 2021-01-22 MED ORDER — LORAZEPAM 2 MG/ML IJ SOLN
INTRAMUSCULAR | Status: AC
  Filled 2021-01-22: qty 1

## 2021-01-22 MED ORDER — NICOTINE 21 MG/24HR TD PT24
21.0000 mg | MEDICATED_PATCH | Freq: Every day | TRANSDERMAL | Status: DC
  Administered 2021-01-22 – 2021-01-23 (×2): 21 mg via TRANSDERMAL
  Filled 2021-01-22 (×2): qty 1

## 2021-01-22 MED ORDER — HALOPERIDOL LACTATE 5 MG/ML IJ SOLN: 2.0000 mg | Freq: Four times a day (QID) | INTRAMUSCULAR | Status: DC | PRN

## 2021-01-22 NOTE — Care Management (Signed)
[facility] notified of ER visit subsequent admission authorization for ER admit  [id number]

## 2021-01-22 NOTE — Progress Notes (Signed)
Neurology Progress Note  Patient ID: Craig Holden is a 65 y.o. with PMHx of  has a past medical history of CVA (cerebral infarction), Depression, Hypertension, Nerve damage, and PTSD (post-traumatic stress disorder).  Initially consulted for: Seizure  Subjective: No acute complaints at this time other than being stuck in the hospital.  Eager to go home.  Exam: Vitals:   01/22/21 1615 01/22/21 2026  BP: 135/79 135/79  Pulse: 79 82  Resp: 19 17  Temp: 98.1 F (36.7 C) 98.1 F (36.7 C)  SpO2: 94% 96%   Gen: In bed, comfortable  Resp: non-labored breathing, no grossly audible wheezing Cardiac: Perfusing extremities well  Abd: soft, nt  Neuro: MS: Awake, alert, oriented to person/place/time.  Did not know that he was here for having had a seizure but was able to retain this information when I provided to him.  Fluent speech.  No evidence of neglect. CN: EOMI to tracking examiner, subtle right facial droop, tongue midline, Motor/coordination: Mild right upper extremity pronator drift, right lower extremity is noticeably weaker/less coordinated than the left and barely antigravity, while the left lower extremity is 5/5 in hip flexion Sensory: Reports sensation is equal to light touch in all 4 extremities   Pertinent Labs:  CBC and BMP within normal limits, HIV negative  EEG: Description: No clear posterior dominant rhythm was seen. Sleep was characterized by sleep spindles (12 to 14 Hz), maximal frontocentral region. There is an excessive amount of 15 to 18 Hz, beta activity distributed symmetrically and diffusely.  Sharp transients were noted in right temporal region.  Hyperventilation and photic stimulation were not performed.   ABNORMALITY -Excessive beta, generalized IMPRESSION: This study is within normal limits. The excessive beta activity seen in the background is most likely due to the effect of benzodiazepine and is a benign EEG pattern. No seizures or definite  epileptiform discharges were seen throughout the recording.    Impression: Suspect patient may have Ativan induced delirium precluding MRI scan.  At this time case is quite appropriate and willing to be rescanned.  Discussed with MRI team who will attempt to scan the patient.  Recommendations: -Neurology will follow up MRI brain, if this is negative patient may be followed on an outpatient basis. -Continue Keppra 500 mg twice daily -At time of discharge seizure precautions should be reviewed with the patient and included in discharge instructions  Standard seizure precautions: Per [facility] statutes, patients with seizures are not allowed to drive until  they have been seizure-free for six months. Use caution when using heavy equipment or power tools. Avoid working on ladders or at heights. Take showers instead of baths. Ensure the water temperature is not too high on the home water heater. Do not go swimming alone. When caring for infants or small children, sit down when holding, feeding, or changing them to minimize risk of injury to the child in the event you have a seizure.  To reduce risk of seizures, maintain good sleep hygiene avoid alcohol and illicit drug use, take all anti-seizure medications as prescribed.   Brooke Dare MD-PhD [facility] [phone]   Greater than 25 minutes were spent in the care of this patient today, greater than 50% of bedside performing a detailed mental status exam and explained to him the likelihood of post stroke epilepsy and the need for further imaging to confirm there is no acute intracranial process that would require further management

## 2021-01-22 NOTE — Procedures (Addendum)
Patient Name: Craig Holden  MRN: [id number]  Epilepsy Attending: Charlsie Quest  Referring Physician/Provider: Dr Caryl Pina Date: 01/22/2021 Duration: 28.39 mins  Patient history: 65 year old male with prior left MCA stroke presenting with new onset seizures x 2.  EEG to evaluate for seizures.   Level of alertness: Awake, asleep  AEDs during EEG study: LEV  Technical aspects: This EEG study was done with scalp electrodes positioned according to the 10-20 International system of electrode placement. Electrical activity was acquired at a sampling rate of 500Hz  and reviewed with a high frequency filter of 70Hz  and a low frequency filter of 1Hz . EEG data were recorded continuously and digitally stored.   Description: No clear posterior dominant rhythm was seen. Sleep was characterized by sleep spindles (12 to 14 Hz), maximal frontocentral region. There is an excessive amount of 15 to 18 Hz, beta activity distributed symmetrically and diffusely.  Sharp transients were noted in right temporal region.  Hyperventilation and photic stimulation were not performed.    ABNORMALITY -Excessive beta, generalized  IMPRESSION: This study is within normal limits. The excessive beta activity seen in the background is most likely due to the effect of benzodiazepine and is a benign EEG pattern. No seizures or definite epileptiform discharges were seen throughout the recording.  Bonnita Newby 

## 2021-01-22 NOTE — Plan of Care (Signed)
  Problem: Education: Goal: Knowledge of General Education information will improve Description Including pain rating scale, medication(s)/side effects and non-pharmacologic comfort measures Outcome: Progressing   Problem: Health Behavior/Discharge Planning: Goal: Ability to manage health-related needs will improve Outcome: Progressing   

## 2021-01-22 NOTE — Progress Notes (Signed)
PROGRESS NOTE    Craig Holden  ZOX:[id number] DOB: [birth year] DOA: 01/21/2021 PCP: Fleet ContrasAvbuere, Edwin, MD   Chief Complaint  Patient presents with   Seizures    Brief Narrative:   Craig Holden is a 65 y.o. male with history of prior stroke with some mild weakness of the right side, ongoing tobacco abuse and hypertension presents to the ER after patient had a seizure-like activity at home.  Patient's wife states that patient had a seizure-like activity while sitting on the couch this evening.  Lasted for few seconds following which patient lost consciousness.  Patient did bite his tongue done.  She immediately called EMS.  EMS on arrival patient had another seizure which lasted 90 seconds.  As per the report patient had a generalized tonic-clonic seizure.  EMS gave patient Versed 5 mg and brought to the ER.   ED Course: In the ER patient was combative and agitated was given IV Ativan and CT head was done which shows old left MCA infarct.  Patient was sedated and by the time I examined patient became alert awake oriented to his name and place moving all extremities.  Lab work was largely unremarkable except for mildly elevated lactic acid which improved with fluids and mildly elevated high sensitive troponin with nonspecific EKG changes.  On-call neurologist Dr. Otelia LimesLindzen was consulted.  Patient admitted for possible new onset seizures.  COVID test negative.  Urine drug screen is positive for benzodiazepine and marijuana.    Assessment & Plan:   Principal Problem:   Seizures (HCC) Active Problems:   Essential hypertension   History of stroke   Tobacco use disorder   Seizure (HCC)  Possible new onset seizure -Neurology input greatly appreciated, loaded milligrams of Keppra, continue with Keppra 100 mg p.o. twice daily . -EEG was obtained, no evidence of current seizure activities . -On as needed and Ativan for seizures. -Continue with seizures precaution. - attempted MRI brain  with/without contrast x2 with premedication with 2mg   of Ativan, but could not be done given patient's significant confusion. -No seizure precautions. -We will await further recommendations from neurology  Altered mental status -Per wife patient is much more confused than his baseline, this is most likely due to postictal status -Awaiting MRI  Lactic acidosis -Likely due to seizures  Hypertension on ARB and diuretics.  Prior stroke on Plavix and statins.  Tobacco abuse advised about quitting.    Mildly elevated high sensitive troponins.  Denies any chest pain.  Will trend cardiac markers.     DVT prophylaxis: Lovenox Code Status: Full Family Communication: D/W wife at bedside Disposition:   Status is: Observation  The patient will require care spanning > 2 midnights and should be moved to inpatient because: Altered mental status and Ongoing diagnostic testing needed not appropriate for outpatient work up  Dispo: The patient is from: Home              Anticipated d/c is to: Home              Patient currently is not medically stable to d/c.  Still awaiting further work-up including MRI, patient remains significantly confused, significant change from baseline.   Difficult to place patient No       Consultants:  neurology   Subjective:  Patient himself is confused, reports he wants to smoke, wife at bedside reported that husband is very confused and not far from his baseline  Objective: Vitals:   01/22/21 0454 01/22/21 0830 01/22/21  1215 01/22/21 1615  BP: 121/75 114/80 138/87 135/79  Pulse: 73 76 84 79  Resp: 18 15 16 19   Temp: 98.7 F (37.1 C) 98.8 F (37.1 C) 98 F (36.7 C) 98.1 F (36.7 C)  TempSrc: Axillary Oral Axillary Axillary  SpO2: 92% 97% 94% 94%    Intake/Output Summary (Last 24 hours) at 01/22/2021 1643 Last data filed at 01/22/2021 0500 Gross per 24 hour  Intake --  Output 400 ml  Net -400 ml   There were no vitals filed for this  visit.  Examination:  Awake , patient is pleasant, but he is  confused, oriented x2, with diminished cognition and insight Symmetrical Chest wall movement, Good air movement bilaterally, CTAB RRR,No Gallops,Rubs or new Murmurs, No Parasternal Heave +ve B.Sounds, Abd Soft, No tenderness, No rebound - guarding or rigidity. No Cyanosis, Clubbing or edema, No new Rash or bruise        Data Reviewed: I have personally reviewed following labs and imaging studies  CBC: Recent Labs  Lab 01/21/21 1646 01/22/21 0729  WBC 6.9 9.3  NEUTROABS 5.1  --   HGB 14.7 14.0  HCT 43.0 39.1  MCV 94.1 92.2  PLT 197 186    Basic Metabolic Panel: Recent Labs  Lab 01/21/21 1646 01/22/21 0729  NA 138 137  K 3.9 3.5  CL 105 104  CO2 24 23  GLUCOSE 98 99  BUN 9 8  CREATININE 1.10 0.96  CALCIUM 9.3 9.0  MG  --  1.7    GFR: CrCl cannot be calculated (Unknown ideal weight.).  Liver Function Tests: Recent Labs  Lab 01/21/21 1646  AST 16  ALT 9  ALKPHOS 86  BILITOT 0.9  PROT 6.7  ALBUMIN 3.6    CBG: Recent Labs  Lab 01/21/21 1720  GLUCAP 100*     Recent Results (from the past 240 hour(s))  Resp Panel by RT-PCR (Flu A&B, Covid) Nasopharyngeal Swab     Status: None   Collection Time: 01/21/21  5:40 PM   Specimen: Nasopharyngeal Swab; Nasopharyngeal(NP) swabs in vial transport medium  Result Value Ref Range Status   SARS Coronavirus 2 by RT PCR NEGATIVE NEGATIVE Final    Comment: (NOTE) SARS-CoV-2 target nucleic acids are NOT DETECTED.  The SARS-CoV-2 RNA is generally detectable in upper respiratory specimens during the acute phase of infection. The lowest concentration of SARS-CoV-2 viral copies this assay can detect is 138 copies/mL. A negative result does not preclude SARS-Cov-2 infection and should not be used as the sole basis for treatment or other patient management decisions. A negative result may occur with  improper specimen collection/handling, submission of  specimen other than nasopharyngeal swab, presence of viral mutation(s) within the areas targeted by this assay, and inadequate number of viral copies(<138 copies/mL). A negative result must be combined with clinical observations, patient history, and epidemiological information. The expected result is Negative.  Fact Sheet for Patients:  01/23/21  Fact Sheet for Healthcare Providers:  BloggerCourse.com  This test is no t yet approved or cleared by the [place] FDA and  has been authorized for detection and/or diagnosis of SARS-CoV-2 by FDA under an Emergency Use Authorization (EUA). This EUA will remain  in effect (meaning this test can be used) for the duration of the COVID-19 declaration under Section 564(b)(1) of the Act, 21 U.S.C.section 360bbb-3(b)(1), unless the authorization is terminated  or revoked sooner.       Influenza A by PCR NEGATIVE NEGATIVE Final   Influenza B  by PCR NEGATIVE NEGATIVE Final    Comment: (NOTE) The Xpert Xpress SARS-CoV-2/FLU/RSV plus assay is intended as an aid in the diagnosis of influenza from Nasopharyngeal swab specimens and should not be used as a sole basis for treatment. Nasal washings and aspirates are unacceptable for Xpert Xpress SARS-CoV-2/FLU/RSV testing.  Fact Sheet for Patients: BloggerCourse.com  Fact Sheet for Healthcare Providers: SeriousBroker.it  This test is not yet approved or cleared by the [place] FDA and has been authorized for detection and/or diagnosis of SARS-CoV-2 by FDA under an Emergency Use Authorization (EUA). This EUA will remain in effect (meaning this test can be used) for the duration of the COVID-19 declaration under Section 564(b)(1) of the Act, 21 U.S.C. section 360bbb-3(b)(1), unless the authorization is terminated or revoked.  Performed at [facility], [address],  [place], [place] [address]          Radiology Studies: CT HEAD WO CONTRAST  Result Date: 01/21/2021 CLINICAL DATA:  Seizure EXAM: CT HEAD WITHOUT CONTRAST TECHNIQUE: Contiguous axial images were obtained from the base of the skull through the vertex without intravenous contrast. COMPARISON:  10/21/2015 FINDINGS: Brain: Probable old left MCA infarct in the insular region with encephalomalacia. No acute intracranial abnormality. Specifically, no hemorrhage, hydrocephalus, mass lesion, acute infarction, or significant intracranial injury. Vascular: No hyperdense vessel or unexpected calcification. Skull: No acute calvarial abnormality. Sinuses/Orbits: No acute findings Other: None IMPRESSION: Old left MCA infarct with encephalomalacia. No acute intracranial abnormality. Electronically Signed   By: Charlett Nose M.D.   On: 01/21/2021 17:39   DG Chest Port 1 View  Result Date: 01/21/2021 CLINICAL DATA:  Recent seizure activity, initial encounter EXAM: PORTABLE CHEST 1 VIEW COMPARISON:  12/09/2020 FINDINGS: Cardiac shadow is stable. Lungs are well aerated bilaterally. No focal infiltrate or sizable effusion is seen. Postsurgical changes in the cervical spine are noted. IMPRESSION: No active disease. Electronically Signed   By: Alcide Clever M.D.   On: 01/21/2021 19:55   EEG adult  Result Date: 01/22/2021 Charlsie Quest, MD     01/22/2021  8:52 AM Patient Name: ANDRAY ASSEFA MRN: [id number] Epilepsy Attending: Charlsie Quest Referring Physician/Provider: Dr Caryl Pina Date: 01/22/2021 Duration: 28.39 mins Patient history: 65 year old male with prior left MCA stroke presenting with new onset seizures x 2.  EEG to evaluate for seizures. Level of alertness: Awake, asleep AEDs during EEG study: LEV Technical aspects: This EEG study was done with scalp electrodes positioned according to the 10-20 International system of electrode placement. Electrical activity was acquired at a sampling rate of 500Hz  and  reviewed with a high frequency filter of 70Hz  and a low frequency filter of 1Hz . EEG data were recorded continuously and digitally stored. Description: No clear posterior dominant rhythm was seen. Sleep was characterized by sleep spindles (12 to 14 Hz), maximal frontocentral region. There is an excessive amount of 15 to 18 Hz, beta activity distributed symmetrically and diffusely.  Sharp transients were noted in right temporal region.  Hyperventilation and photic stimulation were not performed.  ABNORMALITY -Excessive beta, generalized IMPRESSION: This study is within normal limits. The excessive beta activity seen in the background is most likely due to the effect of benzodiazepine and is a benign EEG pattern. No seizures or definite epileptiform discharges were seen throughout the recording. Priyanka        Scheduled Meds:  atorvastatin  20 mg Oral Daily   clopidogrel  75 mg Oral Daily  enoxaparin (LOVENOX) injection  40 mg Subcutaneous Q24H   hydrochlorothiazide  12.5 mg Oral Daily   levETIRAcetam  500 mg Oral BID   LORazepam  1 mg Intravenous Once   losartan  50 mg Oral Daily   nicotine  21 mg Transdermal Daily   Continuous Infusions:  sodium chloride 75 mL/hr at 01/22/21 1146     LOS: 0 days       Huey Bienenstock, MD [facility]   To contact the attending provider between 7A-7P or the covering provider during after hours 7P-7A, please log into the web site [email or web address] and access using universal [place] password for that web site. If you do not have the password, please call the hospital operator.  01/22/2021, 4:43 PM

## 2021-01-22 NOTE — Progress Notes (Signed)
Pt brought to MRI for exam. Pt was screened, setup for exam and placed in MRI. Pt moved continuously. Attempted to redirect pt verbally but continued to move. Attempted motion reduction imaging but still too motion degraded. Unable to obtain diagnostic imaging in pt's current state. Pt sent back to room.

## 2021-01-23 ENCOUNTER — Other Ambulatory Visit: Payer: Self-pay

## 2021-01-23 ENCOUNTER — Inpatient Hospital Stay (HOSPITAL_COMMUNITY): Payer: Medicare Other

## 2021-01-23 DIAGNOSIS — I10 Essential (primary) hypertension: Secondary | ICD-10-CM

## 2021-01-23 LAB — CBC
HCT: 38.8 % — ABNORMAL LOW (ref 39.0–52.0)
Hemoglobin: 13.6 g/dL (ref 13.0–17.0)
MCH: 32.5 pg (ref 26.0–34.0)
MCHC: 35.1 g/dL (ref 30.0–36.0)
MCV: 92.6 fL (ref 80.0–100.0)
Platelets: 189 10*3/uL (ref 150–400)
RBC: 4.19 MIL/uL — ABNORMAL LOW (ref 4.22–5.81)
RDW: 14 % (ref 11.5–15.5)
WBC: 6.5 10*3/uL (ref 4.0–10.5)
nRBC: 0 % (ref 0.0–0.2)

## 2021-01-23 LAB — BASIC METABOLIC PANEL
Anion gap: 8 (ref 5–15)
BUN: 7 mg/dL — ABNORMAL LOW (ref 8–23)
CO2: 23 mmol/L (ref 22–32)
Calcium: 8.8 mg/dL — ABNORMAL LOW (ref 8.9–10.3)
Chloride: 107 mmol/L (ref 98–111)
Creatinine, Ser: 0.99 mg/dL (ref 0.61–1.24)
GFR, Estimated: >60 mL/min (ref >60)
Glucose, Bld: 82 mg/dL (ref 70–99)
Potassium: 3.2 mmol/L — ABNORMAL LOW (ref 3.5–5.1)
Sodium: 138 mmol/L (ref 135–145)

## 2021-01-23 MED ORDER — POTASSIUM CHLORIDE CRYS ER 20 MEQ PO TBCR
40.0000 meq | EXTENDED_RELEASE_TABLET | Freq: Four times a day (QID) | ORAL | Status: DC
  Administered 2021-01-23: 40 meq via ORAL
  Filled 2021-01-23: qty 2

## 2021-01-23 MED ORDER — ENSURE ENLIVE PO LIQD
237.0000 mL | Freq: Two times a day (BID) | ORAL | Status: DC
  Administered 2021-01-23: 237 mL via ORAL

## 2021-01-23 MED ORDER — GADOBUTROL 1 MMOL/ML IV SOLN
7.5000 mL | Freq: Once | INTRAVENOUS | Status: AC | PRN
  Administered 2021-01-23: 7.5 mL via INTRAVENOUS

## 2021-01-23 MED ORDER — LEVETIRACETAM 500 MG PO TABS: 500.0000 mg | ORAL_TABLET | Freq: Two times a day (BID) | ORAL | 3 refills | Status: DC

## 2021-01-23 MED ORDER — ADULT MULTIVITAMIN W/MINERALS CH
1.0000 | ORAL_TABLET | Freq: Every day | ORAL | Status: DC
  Administered 2021-01-23: 1 via ORAL
  Filled 2021-01-23: qty 1

## 2021-01-23 MED ORDER — ENSURE ENLIVE PO LIQD: 237.0000 mL | Freq: Two times a day (BID) | ORAL | 12 refills | Status: DC

## 2021-01-23 MED ORDER — NICOTINE 21 MG/24HR TD PT24: 21.0000 mg | MEDICATED_PATCH | Freq: Every day | TRANSDERMAL | 0 refills | Status: DC

## 2021-01-23 NOTE — Progress Notes (Signed)
Initial Nutrition Assessment  DOCUMENTATION CODES:   Not applicable  INTERVENTION:   Ensure Enlive po BID, each supplement provides 350 kcal and 20 grams of protein MVI with minerals daily   NUTRITION DIAGNOSIS:   Increased nutrient needs related to acute illness as evidenced by estimated needs.  GOAL:   Patient will meet greater than or equal to 90% of their needs  MONITOR:   PO intake, Supplement acceptance, Weight trends, Labs, I & O's  REASON FOR ASSESSMENT:   Malnutrition Screening Tool    ASSESSMENT:   Patient with PMH significant for previous CVA, depression, HTN, and PTSD. Presents this admission with possible new onset seizure.  Patient in MRI upon RD assessment. Last meal completion charted as 75%. RD to provide supplementation to maximize kcal and protein this admission.   Weight history limited over the last year. Will need to obtain nutrition history and NFPE to assess for malnutrition.   Drips: NS @ 75 ml/hr  Medications: 40 mEq Kcl BID Labs: K 3.2 (L)  Diet Order:   Diet Order             Diet Heart Room service appropriate? Yes; Fluid consistency: Thin  Diet effective now                   EDUCATION NEEDS:   Not appropriate for education at this time  Skin:  Skin Assessment: Reviewed RN Assessment  Last BM:  unknown  Height:   Ht Readings from Last 1 Encounters:  01/23/21 6\' 1"  (1.854 m)    Weight:   Wt Readings from Last 1 Encounters:  01/23/21 72.6 kg    BMI:  Body mass index is 21.12 kg/m.  Estimated Nutritional Needs:   Kcal:  2200-2400 kcal  Protein:  110-125 grams  Fluid:  >/= 2 L/day  01/25/21 MS, RD, LDN, CNSC Clinical Nutrition Pager listed in AMION

## 2021-01-23 NOTE — Discharge Instructions (Signed)
-  At time of discharge seizure precautions should be reviewed with the patient and included in discharge instructions   Standard seizure precautions: Per [facility] statutes, patients with seizures are not allowed to drive until  they have been seizure-free for six months. Use caution when using heavy equipment or power tools. Avoid working on ladders or at heights. Take showers instead of baths. Ensure the water temperature is not too high on the home water heater. Do not go swimming alone. When caring for infants or small children, sit down when holding, feeding, or changing them to minimize risk of injury to the child in the event you have a seizure.  To reduce risk of seizures, maintain good sleep hygiene avoid alcohol and illicit drug use, take all anti-seizure medications as prescribed.

## 2021-01-23 NOTE — Plan of Care (Signed)
Due to MRI volumes and the volume of urgent/emergent scans the patient could not be scanned overnight.  Per MRI technician the will be planning to scan him shortly this morning.  Should the patient fail MRI scan again, given this is the third attempt, will obtain head CT scan for stability instead.  Neurology will follow up this brain imaging.  Please notify me if patient does not tolerate MRI scan or simply order head CT.   If brain imaging is reassuring, no further inpatient work-up is required and patient should have close neurology follow-up as well as the seizure precautions reviewed as documented in my prior note.  Brooke Dare MD-PhD [facility] [phone] Available 7 AM to 7 PM, outside these hours please contact Neurologist on call listed on AMION

## 2021-01-23 NOTE — Discharge Summary (Signed)
Physician Discharge Summary  Craig Holden WCB:[id number] DOB: 1956-03-31 DOA: 01/21/2021  PCP: Craig Contras, MD  Admit date: 01/21/2021 Discharge date: 01/23/2021  Admitted From: Home Disposition:  Home   Recommendations for Outpatient Follow-up:  Follow up with PCP in 1-2 weeks Patient to follow with neurology as an, ambulatory referral has been done   Seizure precautions and has been given and explained to the patient and wife at bedside.  Home Health:65  Discharge Condition:Stable CODE STATUS:FULL Diet recommendation: Heart Healthy  Brief/Interim Summary:   new onset seizure -This is most likely in the setting of his previous CVA, neurology input greatly appreciated, CT head/MRI with no acute findings, only significant for his chronic left MCA infarct. -He was loaded with Keppra in ED, and started on Keppra 500 mg oral twice daily.  And this was continued on discharge -EEG was obtained, no evidence of current seizure activities . -Cussed with neurology, no further recommendation on discharge, to follow with neurology as an outpatient, ambulatory referral has been done, Keppra has been sent to his pharmacy of choice. -Patient and wife were given and explained seizure precautions as noted on discharge instructions.   Altered mental status due to postictal status -Patient initially lethargic, then significantly altered, confused requiring some as needed haloperidol, this is most likely due to postictal status, this morning he is awake alert and appropriate, pleasant, back to his baseline.     Lactic acidosis -Likely due to seizures   Hypertension on ARB and diuretics.   Prior stroke on Plavix and statins.   Tobacco abuse advised about quitting.     Mildly elevated high sensitive troponins.  Denies any chest pain.  Non-ACS pattern 28>>39 Principal Problem:   Seizures (HCC) Active Problems:   Essential hypertension   History of stroke   Tobacco use disorder    Seizure [facility])    Discharge Instructions  Discharge Instructions     Diet - low sodium heart healthy   Complete by: As directed    Discharge instructions   Complete by: As directed    -At time of discharge seizure precautions was reviewed with the patient and wife and included in discharge instructions   Standard seizure precautions: Per [facility] statutes, patients with seizures are not allowed to drive until  they have been seizure-free for six months. Use caution when using heavy equipment or power tools. Avoid working on ladders or at heights. Take showers instead of baths. Ensure the water temperature is not too high on the home water heater. Do not go swimming alone. When caring for infants or small children, sit down when holding, feeding, or changing them to minimize risk of injury to the child in the event you have a seizure.  To reduce risk of seizures, maintain good sleep hygiene avoid alcohol and illicit drug use, take all anti-seizure medications as prescribed.   Increase activity slowly   Complete by: As directed       Allergies as of 01/23/2021       Reactions   Morphine Sulfate Nausea And Vomiting        Medication List     TAKE these medications    albuterol 108 (90 Base) MCG/ACT inhaler Commonly known as: VENTOLIN HFA Inhale 1-2 puffs into the lungs every 6 (six) hours as needed for wheezing.   atorvastatin 20 MG tablet Commonly known as: LIPITOR Take 20 mg by mouth daily.   clopidogrel 75 MG tablet Commonly known as: PLAVIX Take 1 tablet (  75 mg total) by mouth daily with breakfast.   feeding supplement Liqd Take 237 mLs by mouth 2 (two) times daily between meals. Start taking on: January 24, 2021   hydrochlorothiazide 25 MG tablet Commonly known as: HYDRODIURIL Take 12.5 mg by mouth daily.   levETIRAcetam 500 MG tablet Commonly known as: Keppra Take 1 tablet (500 mg total) by mouth 2 (two) times daily.   losartan 100 MG  tablet Commonly known as: COZAAR Take 50 mg by mouth daily.   nicotine 21 mg/24hr patch Commonly known as: NICODERM CQ - dosed in mg/24 hours Place 1 patch (21 mg total) onto the skin daily. Start taking on: January 24, 2021   ondansetron 4 MG disintegrating tablet Commonly known as: Zofran ODT Take 1 tablet (4 mg total) by mouth every 8 (eight) hours as needed for nausea.   pregabalin 75 MG capsule Commonly known as: Lyrica Take 1 capsule (75 mg total) by mouth 2 (two) times daily.        Follow-up Information     Craig Contras, MD Follow up.   Specialty: Internal Medicine Contact information: [address] [place] [address] [phone]                Allergies  Allergen Reactions   Morphine Sulfate Nausea And Vomiting    Consultations: Neurology   Procedures/Studies: CT HEAD WO CONTRAST  Result Date: 01/21/2021 CLINICAL DATA:  Seizure EXAM: CT HEAD WITHOUT CONTRAST TECHNIQUE: Contiguous axial images were obtained from the base of the skull through the vertex without intravenous contrast. COMPARISON:  10/21/2015 FINDINGS: Brain: Probable old left MCA infarct in the insular region with encephalomalacia. No acute intracranial abnormality. Specifically, no hemorrhage, hydrocephalus, mass lesion, acute infarction, or significant intracranial injury. Vascular: No hyperdense vessel or unexpected calcification. Skull: No acute calvarial abnormality. Sinuses/Orbits: No acute findings Other: None IMPRESSION: Old left MCA infarct with encephalomalacia. No acute intracranial abnormality. Electronically Signed   By: Charlett Nose M.D.   On: 01/21/2021 17:39   MR Brain W and Wo Contrast  Result Date: 01/23/2021 CLINICAL DATA:  Seizure, abnormal neuro exam. EXAM: MRI HEAD WITHOUT AND WITH CONTRAST TECHNIQUE: Multiplanar, multiecho pulse sequences of the brain and surrounding structures were obtained without and with intravenous contrast. CONTRAST:  7.61mL GADAVIST  GADOBUTROL 1 MMOL/ML IV SOLN COMPARISON:  Head CT 01/21/2021 and MRI 04/19/2015 FINDINGS: Brain: There is no evidence of an acute infarct, mass, midline shift, or extra-axial fluid collection. A moderate-sized chronic left MCA infarct is again noted involving the insula, frontal lobe, and parietal lobe. 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 There is a chronic lacunar infarct involving the left basal ganglia/genu of the left internal capsule. A chronic microhemorrhage is noted in the posterior left temporal lobe. There is mild cerebral atrophy. An arteriovenous malformation in the anterior right temporal region with multiple enlarged draining veins is similar in appearance to a 08/07/2009 MRI. There is mild encephalomalacia in the right temporal lobe. The hippocampi are symmetric in size and signal. A developmental venous anomaly is noted in the left frontal lobe. Vascular: Major intracranial vascular flow voids are preserved. Right temporal AVM. Skull and upper cervical spine: Unremarkable bone marrow signal. Sinuses/Orbits: Unremarkable orbits. Moderate bilateral ethmoid sinus mucosal thickening. Trace right mastoid fluid. Other: None. IMPRESSION: 1. No acute intracranial abnormality. 2. Chronic left MCA infarct. 3. Right temporal arteriovenous malformation, similar to  a 08/07/2009 MRI. Electronically Signed   By: Sebastian AcheAllen  Grady M.D.   On: 01/23/2021 11:24   DG Chest Port 1 View  Result Date: 01/21/2021 CLINICAL DATA:  Recent seizure activity, initial encounter EXAM: PORTABLE CHEST 1 VIEW COMPARISON:  12/09/2020 FINDINGS: Cardiac shadow is stable. Lungs are well aerated bilaterally. No focal infiltrate or sizable effusion is seen. Postsurgical changes in the cervical spine are noted. IMPRESSION: No active disease. Electronically Signed   By: Alcide CleverMark  Lukens M.D.   On: 01/21/2021  19:55   EEG adult  Result Date: 01/22/2021 Charlsie QuestYadav, Priyanka O, MD     01/22/2021  8:52 AM Patient Name: Craig HildaJames B Sidney MRN: [id number] Epilepsy Attending: Charlsie QuestPriyanka O Yadav Referring Physician/Provider: Dr Caryl PinaEric Lindzen Date: 01/22/2021 Duration: 28.39 mins Patient history: 65 year old male with prior left MCA stroke presenting with new onset seizures x 2.  EEG to evaluate for seizures. Level of alertness: Awake, asleep AEDs during EEG study: LEV Technical aspects: This EEG study was done with scalp electrodes positioned according to the 10-20 International system of electrode placement. Electrical activity was acquired at a sampling rate of 500Hz  and reviewed with a high frequency filter of 70Hz  and a low frequency filter of 1Hz . EEG data were recorded continuously and digitally stored. Description: No clear posterior dominant rhythm was seen. Sleep was characterized by sleep spindles (12 to 14 Hz), maximal frontocentral region. There is an excessive amount of 15 to 18 Hz, beta activity distributed symmetrically and diffusely.  Sharp transients were noted in right temporal region.  Hyperventilation and photic stimulation were not performed.  ABNORMALITY -Excessive beta, generalized IMPRESSION: This study is within normal limits. The excessive beta activity seen in the background is most likely due to the effect of benzodiazepine and is a benign EEG pattern. No seizures or definite epileptiform discharges were seen throughout the recording. Priyanka Annabelle Harman Yadav      Subjective:  Patient denies any complaints this morning. Discharge Exam: Vitals:   01/23/21 0807 01/23/21 1226  BP: 129/78 110/73  Pulse: 87 71  Resp: 20 18  Temp: 98.3 F (36.8 C) 98.8 F (37.1 C)  SpO2: 97% 98%   Vitals:   01/23/21 0117 01/23/21 0358 01/23/21 0807 01/23/21 1226  BP:  124/73 129/78 110/73  Pulse:  83 87 71  Resp:  18 20 18   Temp:  98.1 F (36.7 C) 98.3 F (36.8 C) 98.8 F (37.1 C)  TempSrc:  Oral Oral Oral  SpO2:   95% 97% 98%  Weight:  72.6 kg    Height: 6\' 1"  (1.854 m)       General: Pt is alert, awake, not in acute distress Cardiovascular: RRR, S1/S2 +, no rubs, no gallops Respiratory: CTA bilaterally, no wheezing, no rhonchi Abdominal: Soft, NT, ND, bowel sounds + Extremities: no edema, no cyanosis    The results of significant diagnostics from this hospitalization (including imaging, microbiology, ancillary and laboratory) are listed below for reference.     Microbiology: Recent Results (from the past 240 hour(s))  Resp Panel by RT-PCR (Flu A&B, Covid) Nasopharyngeal Swab     Status: None   Collection Time: 01/21/21  5:40 PM   Specimen: Nasopharyngeal Swab; Nasopharyngeal(NP) swabs in vial transport medium  Result Value Ref Range Status   SARS Coronavirus 2 by RT PCR NEGATIVE NEGATIVE Final    Comment: (NOTE) SARS-CoV-2 target nucleic acids are NOT DETECTED.  The SARS-CoV-2 RNA is generally detectable in upper respiratory specimens during the acute phase of infection. The  lowest concentration of SARS-CoV-2 viral copies this assay can detect is 138 copies/mL. A negative result does not preclude SARS-Cov-2 infection and should not be used as the sole basis for treatment or other patient management decisions. A negative result may occur with  improper specimen collection/handling, submission of specimen other than nasopharyngeal swab, presence of viral mutation(s) within the areas targeted by this assay, and inadequate number of viral copies(<138 copies/mL). A negative result must be combined with clinical observations, patient history, and epidemiological information. The expected result is Negative.  Fact Sheet for Patients:  BloggerCourse.com  Fact Sheet for Healthcare Providers:  SeriousBroker.it  This test is no t yet approved or cleared by the [place] FDA and  has been authorized for detection and/or diagnosis of  SARS-CoV-2 by FDA under an Emergency Use Authorization (EUA). This EUA will remain  in effect (meaning this test can be used) for the duration of the COVID-19 declaration under Section 564(b)(1) of the Act, 21 U.S.C.section 360bbb-3(b)(1), unless the authorization is terminated  or revoked sooner.       Influenza A by PCR NEGATIVE NEGATIVE Final   Influenza B by PCR NEGATIVE NEGATIVE Final    Comment: (NOTE) The Xpert Xpress SARS-CoV-2/FLU/RSV plus assay is intended as an aid in the diagnosis of influenza from Nasopharyngeal swab specimens and should not be used as a sole basis for treatment. Nasal washings and aspirates are unacceptable for Xpert Xpress SARS-CoV-2/FLU/RSV testing.  Fact Sheet for Patients: BloggerCourse.com  Fact Sheet for Healthcare Providers: SeriousBroker.it  This test is not yet approved or cleared by the [place] FDA and has been authorized for detection and/or diagnosis of SARS-CoV-2 by FDA under an Emergency Use Authorization (EUA). This EUA will remain in effect (meaning this test can be used) for the duration of the COVID-19 declaration under Section 564(b)(1) of the Act, 21 U.S.C. section 360bbb-3(b)(1), unless the authorization is terminated or revoked.  Performed at [facility], [address], [place], [place] [address]      Labs: BNP (last 3 results) No results for input(s): BNP in the last 8760 hours. Basic Metabolic Panel: Recent Labs  Lab 01/21/21 1646 01/22/21 0729 01/23/21 0115  NA 138 137 138  K 3.9 3.5 3.2*  CL 105 104 107  CO2 GLUCOSE 98 99 82  BUN 9 8 7*  CREATININE 1.10 0.96 0.99  CALCIUM 9.3 9.0 8.8*  MG  --  1.7  --    Liver Function Tests: Recent Labs  Lab 01/21/21 1646  AST 16  ALT 9  ALKPHOS 86  BILITOT 0.9  PROT 6.7  ALBUMIN 3.6   No results for input(s): LIPASE, AMYLASE in the last 168 hours. No results for input(s): AMMONIA in the  last 168 hours. CBC: Recent Labs  Lab 01/21/21 1646 01/22/21 0729 01/23/21 0115  WBC 6.9 9.3 6.5  NEUTROABS 5.1  --   --   HGB 14.7 14.0 13.6  HCT 43.0 39.1 38.8*  MCV 94.1 92.2 92.6  PLT 197 186 189   Cardiac Enzymes: No results for input(s): CKTOTAL, CKMB, CKMBINDEX, TROPONINI in the last 168 hours. BNP: Invalid input(s): POCBNP CBG: Recent Labs  Lab 01/21/21 1720  GLUCAP 100*   D-Dimer No results for input(s): DDIMER in the last 72 hours. Hgb A1c No results for input(s): HGBA1C in the last 72 hours. Lipid Profile No results for input(s): CHOL, HDL, LDLCALC, TRIG, CHOLHDL, LDLDIRECT in the last 72 hours. Thyroid function studies No  results for input(s): TSH, T4TOTAL, T3FREE, THYROIDAB in the last 72 hours.  Invalid input(s): FREET3 Anemia work up No results for input(s): VITAMINB12, FOLATE, FERRITIN, TIBC, IRON, RETICCTPCT in the last 72 hours. Urinalysis    Component Value Date/Time   COLORURINE YELLOW 01/21/2021 1825   APPEARANCEUR CLEAR 01/21/2021 1825   LABSPEC 1.020 01/21/2021 1825   PHURINE 5.0 01/21/2021 1825   GLUCOSEU NEGATIVE 01/21/2021 1825   HGBUR NEGATIVE 01/21/2021 1825   BILIRUBINUR NEGATIVE 01/21/2021 1825   KETONESUR NEGATIVE 01/21/2021 1825   PROTEINUR 30 (A) 01/21/2021 1825   UROBILINOGEN 1.0 04/11/2013 1924   NITRITE NEGATIVE 01/21/2021 1825   LEUKOCYTESUR NEGATIVE 01/21/2021 1825   Sepsis Labs Invalid input(s): PROCALCITONIN,  WBC,  LACTICIDVEN Microbiology Recent Results (from the past 240 hour(s))  Resp Panel by RT-PCR (Flu A&B, Covid) Nasopharyngeal Swab     Status: None   Collection Time: 01/21/21  5:40 PM   Specimen: Nasopharyngeal Swab; Nasopharyngeal(NP) swabs in vial transport medium  Result Value Ref Range Status   SARS Coronavirus 2 by RT PCR NEGATIVE NEGATIVE Final    Comment: (NOTE) SARS-CoV-2 target nucleic acids are NOT DETECTED.  The SARS-CoV-2 RNA is generally detectable in upper respiratory specimens during the  acute phase of infection. The lowest concentration of SARS-CoV-2 viral copies this assay can detect is 138 copies/mL. A negative result does not preclude SARS-Cov-2 infection and should not be used as the sole basis for treatment or other patient management decisions. A negative result may occur with  improper specimen collection/handling, submission of specimen other than nasopharyngeal swab, presence of viral mutation(s) within the areas targeted by this assay, and inadequate number of viral copies(<138 copies/mL). A negative result must be combined with clinical observations, patient history, and epidemiological information. The expected result is Negative.  Fact Sheet for Patients:  BloggerCourse.com  Fact Sheet for Healthcare Providers:  SeriousBroker.it  This test is no t yet approved or cleared by the [place] FDA and  has been authorized for detection and/or diagnosis of SARS-CoV-2 by FDA under an Emergency Use Authorization (EUA). This EUA will remain  in effect (meaning this test can be used) for the duration of the COVID-19 declaration under Section 564(b)(1) of the Act, 21 U.S.C.section 360bbb-3(b)(1), unless the authorization is terminated  or revoked sooner.       Influenza A by PCR NEGATIVE NEGATIVE Final   Influenza B by PCR NEGATIVE NEGATIVE Final    Comment: (NOTE) The Xpert Xpress SARS-CoV-2/FLU/RSV plus assay is intended as an aid in the diagnosis of influenza from Nasopharyngeal swab specimens and should not be used as a sole basis for treatment. Nasal washings and aspirates are unacceptable for Xpert Xpress SARS-CoV-2/FLU/RSV testing.  Fact Sheet for Patients: BloggerCourse.com  Fact Sheet for Healthcare Providers: SeriousBroker.it  This test is not yet approved or cleared by the [place] FDA and has been authorized for detection and/or  diagnosis of SARS-CoV-2 by FDA under an Emergency Use Authorization (EUA). This EUA will remain in effect (meaning this test can be used) for the duration of the COVID-19 declaration under Section 564(b)(1) of the Act, 21 U.S.C. section 360bbb-3(b)(1), unless the authorization is terminated or revoked.  Performed at [facility], [address] [place], [place] [address]      Time coordinating discharge: Over 30 minutes  SIGNED:   Huey Bienenstock, MD  [facility] 01/23/2021, 4:48 PM Pager   If 7PM-7AM, please contact night-coverage [email or web address] Password [email or web address]

## 2021-02-22 ENCOUNTER — Ambulatory Visit (INDEPENDENT_AMBULATORY_CARE_PROVIDER_SITE_OTHER): Payer: Medicare Other | Admitting: Neurology

## 2021-02-22 ENCOUNTER — Encounter: Payer: Self-pay | Admitting: Neurology

## 2021-02-22 ENCOUNTER — Other Ambulatory Visit: Payer: Self-pay

## 2021-02-22 VITALS — BP 112/71 | HR 64 | Ht 74.0 in | Wt 170.0 lb

## 2021-02-22 DIAGNOSIS — G40909 Epilepsy, unspecified, not intractable, without status epilepticus: Secondary | ICD-10-CM

## 2021-02-22 NOTE — Patient Instructions (Addendum)
Continue with your Keppra 500 mg twice a day  Will obtain a Keppra level today  Return to clinic in 6 months or sooner if worse    Per [facility] statutes, patients with seizures are not allowed to drive until they have been seizure-free for six months.  Other recommendations include using caution when using heavy equipment or power tools. Avoid working on ladders or at heights. Take showers instead of baths.  Do not swim alone.  Ensure the water temperature is not too high on the home water heater. Do not go swimming alone. Do not lock yourself in a room alone (i.e. bathroom). When caring for infants or small children, sit down when holding, feeding, or changing them to minimize risk of injury to the child in the event you have a seizure. Maintain good sleep hygiene. Avoid alcohol.  Also recommend adequate sleep, hydration, good diet and minimize stress.   During the Seizure  - First, ensure adequate ventilation and place patients on the floor on their left side  Loosen clothing around the neck and ensure the airway is patent. If the patient is clenching the teeth, do not force the mouth open with any object as this can cause severe damage - Remove all items from the surrounding that can be hazardous. The patient may be oblivious to what's happening and may not even know what he or she is doing. If the patient is confused and wandering, either gently guide him/her away and block access to outside areas - Reassure the individual and be comforting - Call 911. In most cases, the seizure ends before EMS arrives. However, there are cases when seizures may last over 3 to 5 minutes. Or the individual may have developed breathing difficulties or severe injuries. If a pregnant patient or a person with diabetes develops a seizure, it is prudent to call an ambulance. - Finally, if the patient does not regain full consciousness, then call EMS. Most patients will remain confused for about 45 to 90  minutes after a seizure, so you must use judgment in calling for help. - Avoid restraints but make sure the patient is in a bed with padded side rails - Place the individual in a lateral position with the neck slightly flexed; this will help the saliva drain from the mouth and prevent the tongue from falling backward - Remove all nearby furniture and other hazards from the area - Provide verbal assurance as the individual is regaining consciousness - Provide the patient with privacy if possible - Call for help and start treatment as ordered by the caregiver   After the Seizure (Postictal Stage)  After a seizure, most patients experience confusion, fatigue, muscle pain and/or a headache. Thus, one should permit the individual to sleep. For the next few days, reassurance is essential. Being calm and helping reorient the person is also of importance.  Most seizures are painless and end spontaneously. Seizures are not harmful to others but can lead to complications such as stress on the lungs, brain and the heart. Individuals with prior lung problems may develop labored breathing and respiratory distress.     Discussed Patients with epilepsy have a small risk of sudden unexpected death, a condition referred to as sudden unexpected death in epilepsy (SUDEP). SUDEP is defined specifically as the sudden, unexpected, witnessed or unwitnessed, nontraumatic and nondrowning death in patients with epilepsy with or without evidence for a seizure, and excluding documented status epilepticus, in which post mortem examination does not reveal  a structural or toxicologic cause for death

## 2021-02-22 NOTE — Progress Notes (Signed)
[facility]  PATIENT: Craig Holden DOB: Jan 25, 1956  REFERRING CLINICIAN: Elgergawy, Leana Roe, MD HISTORY FROM: Patient and wife  REASON FOR VISIT: Seizure    HISTORICAL  CHIEF COMPLAINT:  Chief Complaint  Patient presents with   New Patient (Initial Visit)    Rm 12 , with wife, ED fu for seizure like activity, states he has never had an episode before     HISTORY OF PRESENT ILLNESS:  This is a 65 year old man whit past medical history of hypertension, hyperlipidemia who is presenting for new onset seizure.  Patient said on August 25 who woke up, was not feeling well stated he had upset stomach then wife later heard him made a gurgling noise then when she went to check on him he was face forward and having generalized convulsion.  She called EMS and seizure lasted about 5 to 6-minutes.  Wife said that he was confused and combative afterwards.  He was taken to the ED and per ED note he had a second seizure requiring EMS to give him Versed.  Patient was loaded with Keppra, he did have a EEG which showed excess beta activity secondary to benzodiazepines but no seizure were captured.  He also had MRI which showed a previous stroke.  He was admitted for couple days and discharged after patient returned back to his baseline.  He denies any previous history of seizures, denies any family history of seizures denies any injury from seizures.  Since being discharged from home he has not had any additional seizures.  He is compliant with his medication, levetiracetam 500 mg twice daily and denies any side effect from the medication.   Handedness: Right handed   Seizure Type: GTC   Current frequency: only one time   Any injuries from seizures:   Seizure risk factors: CVA  Previous ASMs: Levetiracetam   Currenty ASMs: Levetiracetam 500 mg BID   ASMs side effects: None   Brain Images: MRI Brain with chronic left MCA infarct   Previous EEGs:    OTHER MEDICAL  CONDITIONS: HLD, HTN  REVIEW OF SYSTEMS: Full 14 system review of systems performed and negative with exception of: as noted in the HPI  ALLERGIES: Allergies  Allergen Reactions   Morphine Sulfate Nausea And Vomiting    HOME MEDICATIONS: Outpatient Medications Prior to Visit  Medication Sig Dispense Refill   albuterol (PROVENTIL HFA;VENTOLIN HFA) 108 (90 BASE) MCG/ACT inhaler Inhale 1-2 puffs into the lungs every 6 (six) hours as needed for wheezing. 1 Inhaler 0   clopidogrel (PLAVIX) 75 MG tablet Take 1 tablet (75 mg total) by mouth daily with breakfast. 30 tablet 2   feeding supplement (ENSURE ENLIVE / ENSURE PLUS) LIQD Take 237 mLs by mouth 2 (two) times daily between meals. 237 mL 12   hydrochlorothiazide (HYDRODIURIL) 25 MG tablet Take 12.5 mg by mouth daily.      losartan (COZAAR) 100 MG tablet Take 50 mg by mouth daily.     nicotine (NICODERM CQ - DOSED IN MG/24 HOURS) 21 mg/24hr patch Place 1 patch (21 mg total) onto the skin daily. 28 patch 0   ondansetron (ZOFRAN ODT) 4 MG disintegrating tablet Take 1 tablet (4 mg total) by mouth every 8 (eight) hours as needed for nausea. 10 tablet 0   atorvastatin (LIPITOR) 20 MG tablet Take 20 mg by mouth daily.     levETIRAcetam (KEPPRA) 500 MG tablet Take 1 tablet (500 mg total) by mouth 2 (two) times daily.  60 tablet 3   pregabalin (LYRICA) 75 MG capsule Take 1 capsule (75 mg total) by mouth 2 (two) times daily. (Patient not taking: No sig reported) 60 capsule 0   No facility-administered medications prior to visit.    PAST MEDICAL HISTORY: Past Medical History:  Diagnosis Date   CVA (cerebral infarction)    Depression    Hypertension    Nerve damage    PTSD (post-traumatic stress disorder)     PAST SURGICAL HISTORY: Past Surgical History:  Procedure Laterality Date   ANTERIOR CERVICAL DECOMPRESSION/DISCECTOMY FUSION 4 LEVELS  01/20/2015   C4-C7   BACK SURGERY     HEMORROIDECTOMY     HIP SURGERY     RADIOLOGY WITH  ANESTHESIA N/A 04/18/2015   Procedure: RADIOLOGY WITH ANESTHESIA;  Surgeon: Julieanne Cotton, MD;  Location: [facility] OR;  Service: Radiology;  Laterality: N/A;    FAMILY HISTORY: Family History  Problem Relation Age of Onset   Heart attack Father    Emphysema Father    Hypertension Mother    Diabetes Mother     SOCIAL HISTORY: Social History   Socioeconomic History   Marital status: Single    Spouse name: Not on file   Number of children: Not on file   Years of education: Not on file   Highest education level: Not on file  Occupational History   Not on file  Tobacco Use   Smoking status: Every Day    Packs/day: 1.00    Types: Cigarettes   Smokeless tobacco: Never  Substance and Sexual Activity   Alcohol use: No   Drug use: No    Types: Marijuana    Comment: denies   Sexual activity: Not on file  Other Topics Concern   Not on file  Social History Narrative   Not on file   Social Determinants of Health   Financial Resource Strain: Not on file  Food Insecurity: Not on file  Transportation Needs: Not on file  Physical Activity: Not on file  Stress: Not on file  Social Connections: Not on file  Intimate Partner Violence: Not on file     PHYSICAL EXAM  GENERAL EXAM/CONSTITUTIONAL: Vitals:  Vitals:   02/22/21 1348  BP: 112/71  Pulse: 64  Weight: 170 lb (77.1 kg)  Height: 6\' 2"  (1.88 m)   Body mass index is 21.83 kg/m. Wt Readings from Last 3 Encounters:  02/22/21 170 lb (77.1 kg)  01/23/21 160 lb 0.9 oz (72.6 kg)  05/16/18 156 lb (70.8 kg)   Patient is in no distress; well developed, nourished and groomed; neck is supple  EYES: Pupils round and reactive to light, Visual fields full to confrontation, Extraocular movements intacts,  No results found.  MUSCULOSKELETAL: Gait, strength, tone, movements noted in Neurologic exam below  NEUROLOGIC: MENTAL STATUS:  No flowsheet data found. awake, alert, oriented to person, place and time recent and  remote memory intact normal attention and concentration language fluent, comprehension intact, naming intact fund of knowledge appropriate  CRANIAL NERVE: 2nd, 3rd, 4th, 6th - pupils equal and reactive to light, visual fields full to confrontation, extraocular muscles intact, no nystagmus 5th - facial sensation symmetric 7th - facial strength symmetric 8th - hearing intact 9th - palate elevates symmetrically, uvula midline 11th - shoulder shrug symmetric 12th - tongue protrusion midline  MOTOR:  Decrease muscle bulk, full strength in the BUE on confrontation. He has a RUE pronator but no drift. BLE is symmetric and full except for right hip flexor  4/5.   SENSORY:  normal and symmetric to light touch  COORDINATION:  finger-nose-finger, fine finger movements normal  REFLEXES:  deep tendon reflexes present and symmetric  GAIT/STATION:  Not tested, patient sitting in a wheelchair but states that he can walk      DIAGNOSTIC DATA (LABS, IMAGING, TESTING) - I reviewed patient records, labs, notes, testing and imaging myself where available.  Lab Results  Component Value Date   WBC 6.5 01/23/2021   HGB 13.6 01/23/2021   HCT 38.8 (L) 01/23/2021   MCV 92.6 01/23/2021   PLT 189 01/23/2021      Component Value Date/Time   NA 138 01/23/2021 0115   K 3.2 (L) 01/23/2021 0115   CL 107 01/23/2021 0115   CO2 23 01/23/2021 0115   GLUCOSE 82 01/23/2021 0115   BUN 7 (L) 01/23/2021 0115   CREATININE 0.99 01/23/2021 0115   CALCIUM 8.8 (L) 01/23/2021 0115   PROT 6.7 01/21/2021 1646   ALBUMIN 3.6 01/21/2021 1646   AST 16 01/21/2021 1646   ALT 9 01/21/2021 1646   ALKPHOS 86 01/21/2021 1646   BILITOT 0.9 01/21/2021 1646   GFRNONAA >60 01/23/2021 0115   GFRAA >60 11/16/2017 1750   Lab Results  Component Value Date   CHOL 166 04/19/2015   HDL 28 (L) 04/19/2015   LDLCALC 101 (H) 04/19/2015   TRIG 183 (H) 04/19/2015   TRIG 187 (H) 04/19/2015   Lab Results  Component Value Date    HGBA1C 5.7 (H) 04/19/2015   No results found for: [id number] Lab Results  Component Value Date   TSH  08/05/2009    1.128 (NOTE)  Please note change in reference ranges for ages 36W to 67Y. Test methodology is 3rd generation TSH     MRI Brain 01/23/2021 1. No acute intracranial abnormality. 2. Chronic left MCA infarct. 3. Right temporal arteriovenous malformation, similar to a 08/07/2009 MRI.   EEG 01/22/2021 This study is within normal limits. The excessive beta activity seen in the background is most likely due to the effect of benzodiazepine and is a benign EEG pattern. No seizures or definite epileptiform discharges were seen throughout the recording.   I personally reviewed brain Images and previous EEG reports.   ASSESSMENT AND PLAN  65 y.o. year old male  with past medical history of hypertension hyperlipidemia who is presenting after seizure on August 25.  Seizure was described as generalized tonic-clonic.  Seizure risk factor include a right MCA stroke.  He was started on levetiracetam 500 mg twice daily and denies any additional seizures since being discharged from the hospital.  He also denies any side effect from the medication.  Patient seizure etiology likely from his right MCA stroke.  I will continue his levetiracetam 500 mg twice daily, I will obtain a level today and if needed I will adjust his dose.  I will see him in 6 months for follow-up.  We have discussed about driving restriction and patient understands to call me if he has an additional seizure or any additional questions.   1. Seizure disorder (HCC)     PLAN: Continue with your Keppra 500 mg twice a day  Will obtain a Keppra level today  Return to clinic in 6 months or sooner if worse    Per [facility] statutes, patients with seizures are not allowed to drive until they have been seizure-free for six months.  Other recommendations include using caution when using heavy equipment or power tools.  Avoid  working on Paediatric nurse or at [employer]. Take showers instead of baths.  Do not swim alone.  Ensure the water temperature is not too high on the home water heater. Do not go swimming alone. Do not lock yourself in a room alone (i.e. bathroom). When caring for infants or small children, sit down when holding, feeding, or changing them to minimize risk of injury to the child in the event you have a seizure. Maintain good sleep hygiene. Avoid alcohol.  Also recommend adequate sleep, hydration, good diet and minimize stress.   During the Seizure  - First, ensure adequate ventilation and place patients on the floor on their left side  Loosen clothing around the neck and ensure the airway is patent. If the patient is clenching the teeth, do not force the mouth open with any object as this can cause severe damage - Remove all items from the surrounding that can be hazardous. The patient may be oblivious to what's happening and may not even know what he or she is doing. If the patient is confused and wandering, either gently guide him/her away and block access to outside areas - Reassure the individual and be comforting - Call 911. In most cases, the seizure ends before EMS arrives. However, there are cases when seizures may last over 3 to 5 minutes. Or the individual may have developed breathing difficulties or severe injuries. If a pregnant patient or a person with diabetes develops a seizure, it is prudent to call an ambulance. - Finally, if the patient does not regain full consciousness, then call EMS. Most patients will remain confused for about 45 to 90 minutes after a seizure, so you must use judgment in calling for help. - Avoid restraints but make sure the patient is in a bed with padded side rails - Place the individual in a lateral position with the neck slightly flexed; this will help the saliva drain from the mouth and prevent the tongue from falling backward - Remove all nearby furniture and other  hazards from the area - Provide verbal assurance as the individual is regaining consciousness - Provide the patient with privacy if possible - Call for help and start treatment as ordered by the caregiver   After the Seizure (Postictal Stage)  After a seizure, most patients experience confusion, fatigue, muscle pain and/or a headache. Thus, one should permit the individual to sleep. For the next few days, reassurance is essential. Being calm and helping reorient the person is also of importance.  Most seizures are painless and end spontaneously. Seizures are not harmful to others but can lead to complications such as stress on the lungs, brain and the heart. Individuals with prior lung problems may develop labored breathing and respiratory distress.     Orders Placed This Encounter  Procedures   Levetiracetam level    No orders of the defined types were placed in this encounter.   Return in about 6 months (around 08/22/2021).    Windell Norfolk, MD 02/22/2021, 2:55 PM  [facility] [address] [place], [place] [address] [phone]

## 2021-02-23 LAB — LEVETIRACETAM LEVEL: Levetiracetam Lvl: 16.2 ug/mL (ref 10.0–40.0)

## 2021-05-01 ENCOUNTER — Other Ambulatory Visit: Payer: Self-pay

## 2021-05-01 ENCOUNTER — Emergency Department (HOSPITAL_BASED_OUTPATIENT_CLINIC_OR_DEPARTMENT_OTHER): Payer: No Typology Code available for payment source

## 2021-05-01 ENCOUNTER — Emergency Department (HOSPITAL_BASED_OUTPATIENT_CLINIC_OR_DEPARTMENT_OTHER)
Admission: EM | Admit: 2021-05-01 | Discharge: 2021-05-02 | Disposition: A | Discharge status: Home / Self Care | Payer: No Typology Code available for payment source | Attending: Emergency Medicine | Admitting: Emergency Medicine

## 2021-05-01 ENCOUNTER — Encounter (HOSPITAL_BASED_OUTPATIENT_CLINIC_OR_DEPARTMENT_OTHER): Payer: Self-pay | Admitting: *Deleted

## 2021-05-01 DIAGNOSIS — Z20822 Contact with and (suspected) exposure to covid-19: Secondary | ICD-10-CM | POA: Diagnosis not present

## 2021-05-01 DIAGNOSIS — J101 Influenza due to other identified influenza virus with other respiratory manifestations: Secondary | ICD-10-CM | POA: Diagnosis not present

## 2021-05-01 DIAGNOSIS — Z79899 Other long term (current) drug therapy: Secondary | ICD-10-CM | POA: Insufficient documentation

## 2021-05-01 DIAGNOSIS — I10 Essential (primary) hypertension: Secondary | ICD-10-CM | POA: Diagnosis not present

## 2021-05-01 DIAGNOSIS — Z7902 Long term (current) use of antithrombotics/antiplatelets: Secondary | ICD-10-CM | POA: Diagnosis not present

## 2021-05-01 DIAGNOSIS — R519 Headache, unspecified: Secondary | ICD-10-CM | POA: Diagnosis present

## 2021-05-01 DIAGNOSIS — F17210 Nicotine dependence, cigarettes, uncomplicated: Secondary | ICD-10-CM | POA: Insufficient documentation

## 2021-05-01 HISTORY — DX: Migraine, unspecified, not intractable, without status migrainosus: G43.909

## 2021-05-01 LAB — RESP PANEL BY RT-PCR (FLU A&B, COVID) ARPGX2
Influenza A by PCR: POSITIVE — AB
Influenza B by PCR: NEGATIVE
SARS Coronavirus 2 by RT PCR: NEGATIVE

## 2021-05-01 MED ORDER — KETOROLAC TROMETHAMINE 60 MG/2ML IM SOLN
60.0000 mg | Freq: Once | INTRAMUSCULAR | Status: AC
  Administered 2021-05-01: 60 mg via INTRAMUSCULAR
  Filled 2021-05-01: qty 2

## 2021-05-01 MED ORDER — PROMETHAZINE HCL 25 MG/ML IJ SOLN
25.0000 mg | Freq: Four times a day (QID) | INTRAMUSCULAR | Status: DC | PRN
  Administered 2021-05-01: 25 mg via INTRAMUSCULAR
  Filled 2021-05-01: qty 1

## 2021-05-01 NOTE — ED Provider Notes (Signed)
[facility] EMERGENCY DEPARTMENT Provider Note   CSN: [id number] Arrival date & time: 05/01/21  1904     History Chief Complaint  Patient presents with   Headache    Craig Holden is a 65 y.o. male.  Patient is a 65 year old male with past medical history of hypertension, prior stroke, PTSD.  Patient apparently suffered a stroke 1 month ago and was evaluated at [facility].  He was started on Keppra.  Since that time he reports having daily headaches.  He describes throbbing pain that is frontal with no visual disturbances, weakness, or numbness.  He also reports his blood pressure being elevated over the past month since being started on Keppra.  He is concerned that the Keppra is canceling out the effects of his blood pressure medication.  He denies any fevers or chills, but does report body aches for the past week.  The history is provided by the patient.  Headache Pain location:  Frontal Quality:  Dull Radiates to:  Does not radiate Onset quality:  Gradual Duration:  30 hours Timing:  Constant Progression:  Worsening Chronicity:  New Similar to prior headaches: no   Relieved by:  Nothing Worsened by:  Nothing     Past Medical History:  Diagnosis Date   CVA (cerebral infarction)    Depression    Hypertension    Migraine    Nerve damage    PTSD (post-traumatic stress disorder)     Patient Active Problem List   Diagnosis Date Noted   Seizures (HCC) 01/21/2021   Seizure (HCC) 01/21/2021   Occlusion of left internal carotid artery, s/p revascularization 04/21/2015   Stroke (cerebrum) (HCC) - L MCA embolic d/t L ICA occlusion    HLD (hyperlipidemia)    Tobacco use disorder    Marijuana abuse    Preoperative evaluation to rule out surgical contraindication 01/01/2014   Essential hypertension 01/01/2014   History of stroke 01/01/2014    Past Surgical History:  Procedure Laterality Date   ANTERIOR CERVICAL DECOMPRESSION/DISCECTOMY FUSION 4 LEVELS   01/20/2015   C4-C7   BACK SURGERY     HEMORROIDECTOMY     HIP SURGERY     RADIOLOGY WITH ANESTHESIA N/A 04/18/2015   Procedure: RADIOLOGY WITH ANESTHESIA;  Surgeon: Julieanne Cotton, MD;  Location: [facility] OR;  Service: Radiology;  Laterality: N/A;       Family History  Problem Relation Age of Onset   Heart attack Father    Emphysema Father    Hypertension Mother    Diabetes Mother     Social History   Tobacco Use   Smoking status: Every Day    Packs/day: 1.00    Types: Cigarettes   Smokeless tobacco: Never  Vaping Use   Vaping Use: Never used  Substance Use Topics   Alcohol use: No   Drug use: Yes    Types: Marijuana    Comment: denies    Home Medications Prior to Admission medications   Medication Sig Start Date End Date Taking? Authorizing Provider  albuterol (PROVENTIL HFA;VENTOLIN HFA) 108 (90 BASE) MCG/ACT inhaler Inhale 1-2 puffs into the lungs every 6 (six) hours as needed for wheezing. 06/12/11   Palumbo, April, MD  clopidogrel (PLAVIX) 75 MG tablet Take 1 tablet (75 mg total) by mouth daily with breakfast. 04/21/15   Layne Benton, NP  feeding supplement (ENSURE ENLIVE / ENSURE PLUS) LIQD Take 237 mLs by mouth 2 (two) times daily between meals. 01/24/21   Elgergawy, Leana Roe,  MD  hydrochlorothiazide (HYDRODIURIL) 25 MG tablet Take 12.5 mg by mouth daily.     [provider]  losartan (COZAAR) 100 MG tablet Take 50 mg by mouth daily.    [provider]  nicotine (NICODERM CQ - DOSED IN MG/24 HOURS) 21 mg/24hr patch Place 1 patch (21 mg total) onto the skin daily. 01/24/21   Elgergawy, Leana Roe, MD  ondansetron (ZOFRAN ODT) 4 MG disintegrating tablet Take 1 tablet (4 mg total) by mouth every 8 (eight) hours as needed for nausea. 07/02/16   Eber Hong, MD    Allergies    Morphine sulfate and Sertraline  Review of Systems   Review of Systems  Neurological:  Positive for headaches.  All other systems reviewed and are negative.  Physical  Exam Updated Vital Signs BP (!) 161/91   Pulse 83   Temp 98.4 F (36.9 C) (Oral)   Resp 18   Ht 6\' 2"  (1.88 m)   Wt 71.2 kg   SpO2 97%   BMI 20.16 kg/m   Physical Exam Vitals and nursing note reviewed.  Constitutional:      General: He is not in acute distress.    Appearance: He is well-developed. He is not diaphoretic.  HENT:     Head: Normocephalic and atraumatic.  Eyes:     General: No visual field deficit.    Extraocular Movements: Extraocular movements intact.     Pupils: Pupils are equal, round, and reactive to light.  Cardiovascular:     Rate and Rhythm: Normal rate and regular rhythm.     Heart sounds: No murmur heard.   No friction rub.  Pulmonary:     Effort: Pulmonary effort is normal. No respiratory distress.     Breath sounds: Normal breath sounds. No wheezing or rales.  Abdominal:     General: Bowel sounds are normal. There is no distension.     Palpations: Abdomen is soft.     Tenderness: There is no abdominal tenderness.  Musculoskeletal:        General: Normal range of motion.     Cervical back: Normal range of motion and neck supple.  Skin:    General: Skin is warm and dry.  Neurological:     Mental Status: He is alert and oriented to person, place, and time.     Cranial Nerves: No cranial nerve deficit, dysarthria or facial asymmetry.     Sensory: No sensory deficit.     Motor: No weakness.     Coordination: Coordination normal.     Gait: Gait normal.    ED Results / Procedures / Treatments   Labs (all labs ordered are listed, but only abnormal results are displayed) Labs Reviewed  RESP PANEL BY RT-PCR (FLU A&B, COVID) ARPGX2 - Abnormal; Notable for the following components:      Result Value   Influenza A by PCR POSITIVE (*)    All other components within normal limits    EKG None  Radiology No results found.  Procedures Procedures   Medications Ordered in ED Medications  ketorolac (TORADOL) injection 60 mg (has no  administration in time range)  promethazine (PHENERGAN) injection 25 mg (has no administration in time range)    ED Course  I have reviewed the triage vital signs and the nursing notes.  Pertinent labs & imaging results that were available during my care of the patient were reviewed by me and considered in my medical decision making (see chart for details).  MDM Rules/Calculators/A&P  Patient is a 65 year old male with prior CVA presenting with complaints of headache.  His headache has been persistent for the past month since experiencing a seizure and being started on Keppra.  Patient is convinced that Keppra is the cause of his headaches.  Patient is neurologically intact here with stable vital signs and negative head CT.  He is also concerned about his blood pressure, but he is 130/70 during my exam.  I discussed care with Dr. Wilford Corner from neurology to see if there is an alternative to the Keppra.  He has recommended Dilantin and patient will be switched from Keppra to Dilantin.  Patient is to follow-up with his neurologist in the next 1 to 2 weeks as he will require a Dilantin level prior to being switched to extended release.  To return as needed for any problems.  He is also influenza A positive with no hypoxia or respiratory distress and ongoing symptoms for 1 week.  I see no role for Tamiflu.  He is to take over-the-counter medications and return as needed.  Final Clinical Impression(s) / ED Diagnoses Final diagnoses:  None    Rx / DC Orders ED Discharge Orders     None        Geoffery Lyons, MD 05/02/21 [phone]

## 2021-05-01 NOTE — ED Triage Notes (Signed)
Pt reports he had a seizure ~ 1.5 months ago and was started on new medicine. Since then he has been having constant mha. Also reports cough and bodyaches x 1 week. States "I am really worried about my blodd pressure"

## 2021-05-01 NOTE — ED Notes (Signed)
Pt care taken, still complaining of a headache

## 2021-05-02 MED ORDER — PHENYTOIN SODIUM EXTENDED 100 MG PO CAPS: 100.0000 mg | ORAL_CAPSULE | Freq: Three times a day (TID) | ORAL | 0 refills | Status: DC

## 2021-05-02 NOTE — Discharge Instructions (Signed)
Stop taking Keppra.  Begin taking Dilantin as prescribed.  Take ibuprofen 600 mg every 6 hours as needed for pain as well as over-the-counter medications as needed for relief of cold symptoms.  Return to the ER if symptoms significantly worsen or change.  You should be rechecked by your neurologist in the next 1 to 2 weeks for a Dilantin level.

## 2021-05-02 NOTE — Plan of Care (Signed)
ON CALL PHONE CONSULT  Call from Dr Judd Lien @ [facility]   Discussion: Pt on Keppra for seizures. Feels headache as side effect. Although not a common side effect, patient wanting recs on alternate AED. Follows with [facility] as outpatient   Recs: Load Fosphenytoin 1250mg  x1 IV Start Dilantin 100 mg TID po from AM Follow with [facility] in 1-2 weeks to closely monitor after med change.   -- , MD Neurologist [facility] Pager: [phone]

## 2021-08-01 ENCOUNTER — Other Ambulatory Visit: Payer: Self-pay

## 2021-08-01 ENCOUNTER — Encounter (HOSPITAL_COMMUNITY): Payer: Self-pay

## 2021-08-01 ENCOUNTER — Emergency Department (HOSPITAL_COMMUNITY): Payer: No Typology Code available for payment source

## 2021-08-01 ENCOUNTER — Inpatient Hospital Stay (HOSPITAL_COMMUNITY)
Admission: EM | Admit: 2021-08-01 | Discharge: 2021-08-03 | DRG: 057 | Disposition: A | Discharge status: Home / Self Care | Payer: No Typology Code available for payment source | Attending: Family Medicine | Admitting: Family Medicine

## 2021-08-01 DIAGNOSIS — Z79899 Other long term (current) drug therapy: Secondary | ICD-10-CM

## 2021-08-01 DIAGNOSIS — Z8673 Personal history of transient ischemic attack (TIA), and cerebral infarction without residual deficits: Secondary | ICD-10-CM

## 2021-08-01 DIAGNOSIS — Z7902 Long term (current) use of antithrombotics/antiplatelets: Secondary | ICD-10-CM | POA: Diagnosis not present

## 2021-08-01 DIAGNOSIS — Z885 Allergy status to narcotic agent status: Secondary | ICD-10-CM

## 2021-08-01 DIAGNOSIS — Z20822 Contact with and (suspected) exposure to covid-19: Secondary | ICD-10-CM | POA: Diagnosis present

## 2021-08-01 DIAGNOSIS — F17200 Nicotine dependence, unspecified, uncomplicated: Secondary | ICD-10-CM | POA: Diagnosis present

## 2021-08-01 DIAGNOSIS — G9340 Encephalopathy, unspecified: Secondary | ICD-10-CM | POA: Diagnosis present

## 2021-08-01 DIAGNOSIS — T4276XA Underdosing of unspecified antiepileptic and sedative-hypnotic drugs, initial encounter: Secondary | ICD-10-CM | POA: Diagnosis present

## 2021-08-01 DIAGNOSIS — I69398 Other sequelae of cerebral infarction: Principal | ICD-10-CM

## 2021-08-01 DIAGNOSIS — Z8249 Family history of ischemic heart disease and other diseases of the circulatory system: Secondary | ICD-10-CM

## 2021-08-01 DIAGNOSIS — F4310 Post-traumatic stress disorder, unspecified: Secondary | ICD-10-CM | POA: Diagnosis present

## 2021-08-01 DIAGNOSIS — Z888 Allergy status to other drugs, medicaments and biological substances status: Secondary | ICD-10-CM

## 2021-08-01 DIAGNOSIS — R509 Fever, unspecified: Secondary | ICD-10-CM | POA: Diagnosis present

## 2021-08-01 DIAGNOSIS — G40909 Epilepsy, unspecified, not intractable, without status epilepticus: Secondary | ICD-10-CM | POA: Diagnosis present

## 2021-08-01 DIAGNOSIS — J449 Chronic obstructive pulmonary disease, unspecified: Secondary | ICD-10-CM | POA: Diagnosis present

## 2021-08-01 DIAGNOSIS — R569 Unspecified convulsions: Secondary | ICD-10-CM

## 2021-08-01 DIAGNOSIS — I10 Essential (primary) hypertension: Secondary | ICD-10-CM | POA: Diagnosis present

## 2021-08-01 DIAGNOSIS — I69344 Monoplegia of lower limb following cerebral infarction affecting left non-dominant side: Secondary | ICD-10-CM

## 2021-08-01 DIAGNOSIS — Z981 Arthrodesis status: Secondary | ICD-10-CM | POA: Diagnosis not present

## 2021-08-01 DIAGNOSIS — E785 Hyperlipidemia, unspecified: Secondary | ICD-10-CM | POA: Diagnosis present

## 2021-08-01 DIAGNOSIS — F330 Major depressive disorder, recurrent, mild: Secondary | ICD-10-CM | POA: Diagnosis present

## 2021-08-01 DIAGNOSIS — Z9114 Patient's other noncompliance with medication regimen: Secondary | ICD-10-CM | POA: Diagnosis not present

## 2021-08-01 DIAGNOSIS — F17210 Nicotine dependence, cigarettes, uncomplicated: Secondary | ICD-10-CM | POA: Diagnosis present

## 2021-08-01 DIAGNOSIS — F1210 Cannabis abuse, uncomplicated: Secondary | ICD-10-CM | POA: Diagnosis present

## 2021-08-01 LAB — RAPID URINE DRUG SCREEN, HOSP PERFORMED
Amphetamines: NOT DETECTED
Barbiturates: NOT DETECTED
Benzodiazepines: POSITIVE — AB
Cocaine: NOT DETECTED
Opiates: NOT DETECTED
Tetrahydrocannabinol: POSITIVE — AB

## 2021-08-01 LAB — COMPREHENSIVE METABOLIC PANEL
ALT: 23 U/L (ref 0–44)
AST: 27 U/L (ref 15–41)
Albumin: 3.9 g/dL (ref 3.5–5.0)
Alkaline Phosphatase: 75 U/L (ref 38–126)
Anion gap: 11 (ref 5–15)
BUN: 12 mg/dL (ref 8–23)
CO2: 22 mmol/L (ref 22–32)
Calcium: 8.9 mg/dL (ref 8.9–10.3)
Chloride: 105 mmol/L (ref 98–111)
Creatinine, Ser: 1.04 mg/dL (ref 0.61–1.24)
GFR, Estimated: >60 mL/min (ref >60)
Glucose, Bld: 112 mg/dL — ABNORMAL HIGH (ref 70–99)
Potassium: 4 mmol/L (ref 3.5–5.1)
Sodium: 138 mmol/L (ref 135–145)
Total Bilirubin: 0.7 mg/dL (ref 0.3–1.2)
Total Protein: 7.1 g/dL (ref 6.5–8.1)

## 2021-08-01 LAB — URINALYSIS, ROUTINE W REFLEX MICROSCOPIC
Bilirubin Urine: NEGATIVE
Glucose, UA: NEGATIVE mg/dL
Hgb urine dipstick: NEGATIVE
Ketones, ur: NEGATIVE mg/dL
Leukocytes,Ua: NEGATIVE
Nitrite: NEGATIVE
Protein, ur: NEGATIVE mg/dL
Specific Gravity, Urine: 1.023 (ref 1.005–1.030)
pH: 6 (ref 5.0–8.0)

## 2021-08-01 LAB — CBC WITH DIFFERENTIAL/PLATELET
Abs Immature Granulocytes: 0.03 10*3/uL (ref 0.00–0.07)
Basophils Absolute: 0 10*3/uL (ref 0.0–0.1)
Basophils Relative: 0 %
Eosinophils Absolute: 0 10*3/uL (ref 0.0–0.5)
Eosinophils Relative: 0 %
HCT: 45 % (ref 39.0–52.0)
Hemoglobin: 15.3 g/dL (ref 13.0–17.0)
Immature Granulocytes: 0 %
Lymphocytes Relative: 18 %
Lymphs Abs: 1.8 10*3/uL (ref 0.7–4.0)
MCH: 33 pg (ref 26.0–34.0)
MCHC: 34 g/dL (ref 30.0–36.0)
MCV: 97.2 fL (ref 80.0–100.0)
Monocytes Absolute: 0.7 10*3/uL (ref 0.1–1.0)
Monocytes Relative: 7 %
Neutro Abs: 7.3 10*3/uL (ref 1.7–7.7)
Neutrophils Relative %: 75 %
Platelets: 181 10*3/uL (ref 150–400)
RBC: 4.63 MIL/uL (ref 4.22–5.81)
RDW: 14.7 % (ref 11.5–15.5)
WBC: 9.9 10*3/uL (ref 4.0–10.5)
nRBC: 0 % (ref 0.0–0.2)

## 2021-08-01 LAB — LACTIC ACID, PLASMA: Lactic Acid, Venous: 1.6 mmol/L (ref 0.5–1.9)

## 2021-08-01 LAB — CBG MONITORING, ED: Glucose-Capillary: 114 mg/dL — ABNORMAL HIGH (ref 70–99)

## 2021-08-01 LAB — RESP PANEL BY RT-PCR (FLU A&B, COVID) ARPGX2
Influenza A by PCR: NEGATIVE
Influenza B by PCR: NEGATIVE
SARS Coronavirus 2 by RT PCR: NEGATIVE

## 2021-08-01 LAB — ETHANOL: Alcohol, Ethyl (B): <10 mg/dL (ref <10)

## 2021-08-01 LAB — MAGNESIUM: Magnesium: 1.8 mg/dL (ref 1.7–2.4)

## 2021-08-01 MED ORDER — ACETAMINOPHEN 650 MG RE SUPP
650.0000 mg | Freq: Once | RECTAL | Status: AC
  Administered 2021-08-01: 650 mg via RECTAL
  Filled 2021-08-01: qty 1

## 2021-08-01 MED ORDER — ENOXAPARIN SODIUM 40 MG/0.4ML IJ SOSY
40.0000 mg | PREFILLED_SYRINGE | INTRAMUSCULAR | Status: DC
  Administered 2021-08-02 – 2021-08-03 (×2): 40 mg via SUBCUTANEOUS
  Filled 2021-08-01 (×2): qty 0.4

## 2021-08-01 MED ORDER — LORAZEPAM 2 MG/ML IJ SOLN
2.0000 mg | Freq: Once | INTRAMUSCULAR | Status: AC
  Administered 2021-08-01: 2 mg via INTRAVENOUS
  Filled 2021-08-01: qty 1

## 2021-08-01 MED ORDER — LEVETIRACETAM IN NACL 1000 MG/100ML IV SOLN
1000.0000 mg | Freq: Once | INTRAVENOUS | Status: AC
  Administered 2021-08-01: 1000 mg via INTRAVENOUS
  Filled 2021-08-01: qty 100

## 2021-08-01 MED ORDER — ALBUTEROL SULFATE (2.5 MG/3ML) 0.083% IN NEBU: 2.5000 mg | INHALATION_SOLUTION | Freq: Four times a day (QID) | RESPIRATORY_TRACT | Status: DC | PRN

## 2021-08-01 MED ORDER — ZONISAMIDE 100 MG PO CAPS
500.0000 mg | ORAL_CAPSULE | Freq: Four times a day (QID) | ORAL | Status: AC
  Filled 2021-08-01 (×2): qty 5

## 2021-08-01 MED ORDER — SODIUM CHLORIDE 0.9 % IV SOLN
2.0000 g | Freq: Once | INTRAVENOUS | Status: AC
  Administered 2021-08-01: 2 g via INTRAVENOUS
  Filled 2021-08-01: qty 20

## 2021-08-01 MED ORDER — ACETAMINOPHEN 500 MG PO TABS
1000.0000 mg | ORAL_TABLET | Freq: Once | ORAL | Status: DC
Start: 2021-08-01 — End: 2021-08-01

## 2021-08-01 MED ORDER — LORAZEPAM 2 MG/ML IJ SOLN
1.0000 mg | Freq: Once | INTRAMUSCULAR | Status: AC
  Administered 2021-08-01: 1 mg via INTRAVENOUS
  Filled 2021-08-01: qty 1

## 2021-08-01 MED ORDER — ASPIRIN EC 81 MG PO TBEC
81.0000 mg | DELAYED_RELEASE_TABLET | Freq: Every day | ORAL | Status: DC
  Administered 2021-08-02 – 2021-08-03 (×2): 81 mg via ORAL
  Filled 2021-08-01 (×2): qty 1

## 2021-08-01 MED ORDER — HYDROCHLOROTHIAZIDE 12.5 MG PO TABS
12.5000 mg | ORAL_TABLET | Freq: Every day | ORAL | Status: DC
  Administered 2021-08-02 – 2021-08-03 (×2): 12.5 mg via ORAL
  Filled 2021-08-01 (×2): qty 1

## 2021-08-01 MED ORDER — NICOTINE 21 MG/24HR TD PT24
21.0000 mg | MEDICATED_PATCH | Freq: Every day | TRANSDERMAL | Status: DC
  Administered 2021-08-02 – 2021-08-03 (×2): 21 mg via TRANSDERMAL
  Filled 2021-08-01 (×2): qty 1

## 2021-08-01 MED ORDER — LOSARTAN POTASSIUM 50 MG PO TABS
50.0000 mg | ORAL_TABLET | Freq: Every day | ORAL | Status: DC
  Administered 2021-08-02 – 2021-08-03 (×2): 50 mg via ORAL
  Filled 2021-08-01 (×2): qty 1

## 2021-08-01 MED ORDER — LORAZEPAM 2 MG/ML IJ SOLN: 4.0000 mg | INTRAMUSCULAR | Status: DC | PRN

## 2021-08-01 MED ORDER — ZONISAMIDE 100 MG PO CAPS: 300.0000 mg | ORAL_CAPSULE | Freq: Every day | ORAL | Status: DC

## 2021-08-01 NOTE — ED Notes (Signed)
This RN attempted to get blood work and IV. Pt was in the room on his side with blankets over his head. This RN explained to the pt she needed an IV and blood work and was going to attempt to start one. This RN removed pts arm from under the covers. Pt began telling this RN and the tech to let his arm go. Pt began trying to jerk away and swat at staff. EDP made aware. Will give ativan IM for now.  ?

## 2021-08-01 NOTE — ED Triage Notes (Signed)
Pt BIB [facility] from home c/o AMS. Family found pt at home on the couch half naked and had urinated on himself. Per family that is not normal. Pt has a hx of strokes and seizures but per family he hasn't had a seizure in over a year and hasn't taken meds for that long as well. Pt is currently alert x2. Pt is alert to himself and his location but thinks it is 2015 and has no idea what got him here.  ? ? ? ?

## 2021-08-01 NOTE — ED Notes (Signed)
This RN received a message from the CT tech that they were unsuccessful in trying to get a CT. Pt became combative with CT. Pt began swatting and trying to bite the staff. Pt was refusing to lay back so CT was unable to complete scan. EDP was notified.  ?

## 2021-08-01 NOTE — ED Notes (Signed)
Adela Lank MD made aware of pt fall. No new orders at this time. Plan of care continues. ?

## 2021-08-01 NOTE — ED Notes (Signed)
Family member at bedside updated about pt status and plan of care. ?

## 2021-08-01 NOTE — ED Notes (Addendum)
RN in another room with a pt when tech came into room to notify that pt is on the ground. RN and tech went into pt room and found pt lying on the ground beside the stretcher. Stretcher was locked and in the lowest position, both side rails were still up. No visible injury. Pt still A&O to himself and where he is at. Pt unable to answer if he hit his head. RN and tech assisted the pt to standing and helped him back into bed. Posey bed alarmed placed on bed under pt. Tyrone Nine MD aware. Vital signs stable. ?
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
## 2021-08-01 NOTE — ED Provider Notes (Signed)
?[facility] EMERGENCY DEPARTMENT ?Provider Note ? ? ?CSN: [id number] ?Arrival date & time: 08/01/21  1250 ? ?  ? ?History ? ?Chief Complaint  ?Patient presents with  ? Altered Mental Status  ? ? ?Craig Holden is a 66 y.o. male. ? ? ?Altered Mental Status ?Patient brought in with mental status change.  Confused upon arrival.  However was talking and giving me history although it was somewhat an error.  Thought that he drove himself to the hospital.  Per family number reportedly found on the couch naked and had urinated on himself.  More confused.  On recheck patient was no longer verbal.  Biting on the pulse ox on his finger.  1 family member reportedly had stated that he was "twitching while he was on the couch.  Does have history of seizures although has been off his medicines for that for about a year.  Reportedly has not had a seizure in a year.  Reportedly is been taking his other medications.  States he has been chilled.  Found to be febrile on rectal temperature. ?  ? ?Home Medications ?Prior to Admission medications   ?Medication Sig Start Date End Date Taking? Authorizing Provider  ?albuterol (PROVENTIL HFA;VENTOLIN HFA) 108 (90 BASE) MCG/ACT inhaler Inhale 1-2 puffs into the lungs every 6 (six) hours as needed for wheezing. 06/12/11   Palumbo, April, MD  ?clopidogrel (PLAVIX) 75 MG tablet Take 1 tablet (75 mg total) by mouth daily with breakfast. 04/21/15   Layne Benton, NP  ?feeding supplement (ENSURE ENLIVE / ENSURE PLUS) LIQD Take 237 mLs by mouth 2 (two) times daily between meals. 01/24/21   Elgergawy, Leana Roe, MD  ?hydrochlorothiazide (HYDRODIURIL) 25 MG tablet Take 12.5 mg by mouth daily.     [provider]  ?losartan (COZAAR) 100 MG tablet Take 50 mg by mouth daily.    [provider]  ?nicotine (NICODERM CQ - DOSED IN MG/24 HOURS) 21 mg/24hr patch Place 1 patch (21 mg total) onto the skin daily. 01/24/21   Elgergawy, Leana Roe, MD  ?ondansetron (ZOFRAN ODT) 4 MG  disintegrating tablet Take 1 tablet (4 mg total) by mouth every 8 (eight) hours as needed for nausea. 07/02/16   Eber Hong, MD  ?phenytoin (DILANTIN) 100 MG ER capsule Take 1 capsule (100 mg total) by mouth 3 (three) times daily. 05/02/21   Geoffery Lyons, MD  ?   ? ?Allergies    ?Morphine sulfate and Sertraline   ? ?Review of Systems   ?Review of Systems ? ?Physical Exam ?Updated Vital Signs ?BP 137/78   Pulse (!) 117   Temp (!) 101.2 ?F (38.4 ?C) (Rectal)   Resp 17   Ht 6' 1.5" (1.867 m)   Wt 81.6 kg   SpO2 94%   BMI 23.43 kg/m?  ?Physical Exam ?Vitals reviewed.  ?HENT:  ?   Head: Atraumatic.  ?Eyes:  ?   Extraocular Movements: Extraocular movements intact.  ?   Pupils: Pupils are equal, round, and reactive to light.  ?Cardiovascular:  ?   Rate and Rhythm: Regular rhythm.  ?Pulmonary:  ?   Breath sounds: No wheezing.  ?Abdominal:  ?   Tenderness: There is no abdominal tenderness.  ?Musculoskeletal:     ?   General: No tenderness.  ?Neurological:  ?   Mental Status: He is alert.  ?   Comments: Moving all extremities.  Awake but confused initially.  Later became nonverbal.  No repetitive motion seen and was moving  all extremities.  ? ? ?ED Results / Procedures / Treatments   ?Labs ?(all labs ordered are listed, but only abnormal results are displayed) ?Labs Reviewed  ?COMPREHENSIVE METABOLIC PANEL - Abnormal; Notable for the following components:  ?    Result Value  ? Glucose, Bld 112 (*)   ? All other components within normal limits  ?CBG MONITORING, ED - Abnormal; Notable for the following components:  ? Glucose-Capillary 114 (*)   ? All other components within normal limits  ?RESP PANEL BY RT-PCR (FLU A&B, COVID) ARPGX2  ?CULTURE, BLOOD (ROUTINE X 2)  ?CULTURE, BLOOD (ROUTINE X 2)  ?ETHANOL  ?CBC WITH DIFFERENTIAL/PLATELET  ?LACTIC ACID, PLASMA  ?RAPID URINE DRUG SCREEN, HOSP PERFORMED  ?URINALYSIS, ROUTINE W REFLEX MICROSCOPIC  ?LACTIC ACID, PLASMA  ? ? ?EKG ?None ? ?Radiology ?CT HEAD WO CONTRAST  ( ) ? ?Result Date: 08/01/2021 ?CLINICAL DATA:  Mental status change. EXAM: CT HEAD WITHOUT CONTRAST TECHNIQUE: Contiguous axial images were obtained from the base of the skull through the vertex without intravenous contrast. RADIATION DOSE REDUCTION: This exam was performed according to the departmental dose-optimization program which includes automated exposure control, adjustment of the mA and/or kV according to patient size and/or use of iterative reconstruction technique. COMPARISON:  05/01/2021 FINDINGS: Brain: No evidence of acute infarction, hemorrhage, hydrocephalus, extra-axial collection or mass lesion/mass effect. Old left frontal lobe infarct, stable. Vascular: No hyperdense vessel or unexpected calcification. Skull: Normal. Negative for fracture or focal lesion. Sinuses/Orbits: Visualized globes and orbits are unremarkable. Small mucous retention cyst in the right maxillary sinus. Sinuses otherwise clear. Other: None. IMPRESSION: 1. No acute intracranial abnormalities. No change from the prior exam. Electronically Signed   By: Amie Portland M.D.   On: 08/01/2021 15:16  ? ?DG Chest Portable 1 View ? ?Result Date: 08/01/2021 ?CLINICAL DATA:  alter mental status EXAM: PORTABLE CHEST 1 VIEW COMPARISON:  January 21, 2021 FINDINGS: The cardiomediastinal silhouette is unchanged in contour.Incomplete assessment of the LEFT costophrenic angle. No significant pleural effusion. No pneumothorax. No acute pleuroparenchymal abnormality. Visualized abdomen is unremarkable. Cervical spine orthopedic hardware. IMPRESSION: No acute cardiopulmonary abnormality. Electronically Signed   By: Meda Klinefelter M.D.   On: 08/01/2021 14:01   ? ?Procedures ?Procedures  ? ? ?Medications Ordered in ED ?Medications  ?LORazepam (ATIVAN) injection 1 mg (1 mg Intravenous Given 08/01/21 1339)  ?levETIRAcetam (KEPPRA) IVPB 1000 mg/100 mL premix (0 mg Intravenous Stopped 08/01/21 1455)  ?levETIRAcetam (KEPPRA) IVPB 1000 mg/100 mL premix (0 mg  Intravenous Stopped 08/01/21 1455)  ?LORazepam (ATIVAN) injection 2 mg (2 mg Intravenous Given 08/01/21 1430)  ? ? ?ED Course/ Medical Decision Making/ A&P ?  ?                        ?Medical Decision Making ?Amount and/or Complexity of Data Reviewed ?External Data Reviewed: notes. ?   Details: Neurology note reviewed.  Previous discharge note reviewed all ?Labs: ordered. ?Radiology: ordered. ? ?Risk ?Prescription drug management. ?Decision regarding hospitalization. ? ?Critical Care ?Total time providing critical care: 30-74 minutes ? ?Patient brought in with mental status change.  Had been reportedly doing better and then became more unresponsive and had urinated on himself.  Questionable "twitching" witnessed by niece.  Does have history of seizures.  Had been on either Keppra or Dilantin but not taking it.  Upon arrival found to be febrile.  Initially confused but answering questions later became more nonverbal.  Required more Ativan and Keppra since potential seizure  activity/postictal activity causing this.  No seizure activity seen by me.  Now more somnolent.  CT scan done and independently interpreted and reassuring.  Chest today also did not show pneumonia.  Urinalysis still pending but lab work otherwise reassuring.  Will require admission to hospital.  Discussed with neurology since patient does have seizure history with mental status change.  They will see.  Will likely require admission the hospital.  Care turned over to Dr. Adela Lank. ? ? ? ? ? ? ? ?Final Clinical Impression(s) / ED Diagnoses ?Final diagnoses:  ?Fever, unspecified fever cause  ?Encephalopathy  ? ? ?Rx / DC Orders ?ED Discharge Orders   ? ? None  ? ?  ? ? ?  ?Benjiman Core, MD ?08/01/21 1544 ? ?

## 2021-08-01 NOTE — ED Provider Notes (Signed)
Received the patient in signout from Dr. Rubin Payor, briefly the patient is a 66 year old male with a chief complaint of a seizure.  Has a history of seizures and is supposed to be on antiepileptic therapy.  Per family report is actually not taking anything at home.  Was thought to be more confused today than normal.  Found to also be febrile to 101.  Plan to wait for UA and then admit to the hospital. ? ?COVID test came back negative negative for flu.  Chest x-ray.  Independently interpreted by me without obvious focal infiltrate. ?  Melene Plan, DO ?08/01/21 2040 ? ?

## 2021-08-01 NOTE — Progress Notes (Signed)
Spoke with Neurology on call, Dr. Thomasena Edis, who advised starting Zonisamide 500mg  q6h x 2 doses and then 300mg  nightly.  ? ?Further recommended Divalproex if patient has comorbid headaches and mood but likely not a higher dose. Unclear at this time if patient was taking Divalproex, so will wait until complete med rec.  ? ?Neurology further recommended only continuing ASA in the setting or prior stroke, not plavix at this time. No further imaging recommendations as well. ? ? , DO ?08/01/2021, 11:48 PM ?PGY-1, [facility]  ?

## 2021-08-02 DIAGNOSIS — R509 Fever, unspecified: Secondary | ICD-10-CM | POA: Diagnosis not present

## 2021-08-02 DIAGNOSIS — I10 Essential (primary) hypertension: Secondary | ICD-10-CM

## 2021-08-02 DIAGNOSIS — G9340 Encephalopathy, unspecified: Secondary | ICD-10-CM

## 2021-08-02 DIAGNOSIS — R569 Unspecified convulsions: Secondary | ICD-10-CM | POA: Diagnosis not present

## 2021-08-02 LAB — BASIC METABOLIC PANEL
Anion gap: 8 (ref 5–15)
BUN: 11 mg/dL (ref 8–23)
CO2: 24 mmol/L (ref 22–32)
Calcium: 8.7 mg/dL — ABNORMAL LOW (ref 8.9–10.3)
Chloride: 107 mmol/L (ref 98–111)
Creatinine, Ser: 1.07 mg/dL (ref 0.61–1.24)
GFR, Estimated: >60 mL/min (ref >60)
Glucose, Bld: 97 mg/dL (ref 70–99)
Potassium: 3.4 mmol/L — ABNORMAL LOW (ref 3.5–5.1)
Sodium: 139 mmol/L (ref 135–145)

## 2021-08-02 LAB — LIPID PANEL
Cholesterol: 163 mg/dL (ref 0–200)
HDL: 37 mg/dL — ABNORMAL LOW (ref >40)
LDL Cholesterol: 110 mg/dL — ABNORMAL HIGH (ref 0–99)
Total CHOL/HDL Ratio: 4.4 RATIO
Triglycerides: 79 mg/dL (ref <150)
VLDL: 16 mg/dL (ref 0–40)

## 2021-08-02 LAB — TSH: TSH: 0.528 u[IU]/mL (ref 0.350–4.500)

## 2021-08-02 LAB — CBC
HCT: 41 % (ref 39.0–52.0)
Hemoglobin: 14.2 g/dL (ref 13.0–17.0)
MCH: 32.7 pg (ref 26.0–34.0)
MCHC: 34.6 g/dL (ref 30.0–36.0)
MCV: 94.5 fL (ref 80.0–100.0)
Platelets: 174 10*3/uL (ref 150–400)
RBC: 4.34 MIL/uL (ref 4.22–5.81)
RDW: 14.4 % (ref 11.5–15.5)
WBC: 8.5 10*3/uL (ref 4.0–10.5)
nRBC: 0 % (ref 0.0–0.2)

## 2021-08-02 LAB — CK: Total CK: 160 U/L (ref 49–397)

## 2021-08-02 LAB — PHENYTOIN LEVEL, TOTAL: Phenytoin Lvl: <2.5 ug/mL — ABNORMAL LOW (ref 10.0–20.0)

## 2021-08-02 LAB — HEMOGLOBIN A1C
Hgb A1c MFr Bld: 4.9 % (ref 4.8–5.6)
Mean Plasma Glucose: 93.93 mg/dL

## 2021-08-02 LAB — VALPROIC ACID LEVEL: Valproic Acid Lvl: <10 ug/mL — ABNORMAL LOW (ref 50.0–100.0)

## 2021-08-02 MED ORDER — ZONISAMIDE 100 MG PO CAPS
500.0000 mg | ORAL_CAPSULE | Freq: Four times a day (QID) | ORAL | Status: AC
  Administered 2021-08-02 (×2): 500 mg via ORAL
  Filled 2021-08-02 (×2): qty 5

## 2021-08-02 MED ORDER — ZONISAMIDE 100 MG PO CAPS
300.0000 mg | ORAL_CAPSULE | Freq: Every day | ORAL | Status: DC
  Filled 2021-08-02: qty 3

## 2021-08-02 MED ORDER — SODIUM CHLORIDE 0.9 % IV SOLN
1.0000 g | INTRAVENOUS | Status: DC
  Administered 2021-08-02: 1 g via INTRAVENOUS
  Filled 2021-08-02: qty 10

## 2021-08-02 MED ORDER — VALPROIC ACID 250 MG PO CAPS
500.0000 mg | ORAL_CAPSULE | Freq: Two times a day (BID) | ORAL | Status: DC
  Administered 2021-08-02 – 2021-08-03 (×3): 500 mg via ORAL
  Filled 2021-08-02 (×4): qty 2

## 2021-08-02 NOTE — Progress Notes (Addendum)
Family Medicine Teaching Service ?Daily Progress Note ?Intern Pager: [phone] ? ?Patient name: Craig Holden Medical record number: [id number] ?Date of birth: 11/03/1955 Age: 66 y.o. Gender: male ? ?Primary Care Provider: Fleet Contras, MD ?Consultants: Neurology ?Code Status: Full ? ?Pt Overview and Major Events to Date:  ?08/01/2021-admitted ? ?Assessment and Plan: ?Patient is a 66 year old male presenting with altered mental status.  PMH significant for HLD, history of stroke, history of seizures, COPD, tobacco and marijuana use, HTN, right hip injury. ? ?AMS, improved  fever, chills, improved  history of seizures ?Patient brought to ED after he was found lying naked on the couch in his own urine with AMS.  Per family, patient had been up a little altered prior to this.  ED patient was febrile to 101.2 which has resolved.  Labs and and imaging (CXR and head CT) were unrevealing as to source of fever/AMS. Per wife, last seizure was 2 months ago.  Home medications listed include Dilantin.  In ED patient was treated with Keppra and ativan in case of potential seizure activity although no seizure was noted by ED provider. He was also empirically treated with 2 g CTX (Prior to blood cx drawn).  CK and WNL, phenytoin and valproic acid both subtheraputic. Pt is unsure about his home medications. There is some questions about what he is actually taking. His wife will hopefully be bringing his home medications today.  Neurology saw patient this morning and recommended restarting previous dose of valproic acid along with starting zonisamide 500 mg every 6 hours for 2 doses and then 300 mg nightly.  At this point, we are still unsure about cause of patient's altered mental status.  Infectious etiology still in the differential although no source for infection has been found.  AMS is more likely related to seizure activity especially as valproic acid level is subtherapeutic.  Although there have been no signs or symptoms  of seizure since patient has been here. This morning pt is A&Ox4. Will order home valproic acid and start zonisamide.  ?-Neurology consulted, appreciate recommendations ?-Load with valproic acid ?-Zonisamide 500mg  q6h 2 doses, then 300mg  nightly ?-Continuous cardiac monitoring ?-Continuous pulse ox ?-Vital signs every 4 hours ?-Neurochecks q2h for 12 hours ?-Seizure and fall precautions ?-Ativan 4 mg IV every 5 minutes x 2 as needed for seizure ?-Follow-up blood culture ?-Follow-up urine culture ?-PT/OT eval and treat ? ?History of CVA(2015, 2017)  ?MRI brain from 12/2020 showed chronic left MCA infarct and right temporal AV malformation.  Patient has residual lower extremity weakness and balance difficulty also from previous right hip injury and uses a walker.  Home medications include Plavix 75 mg daily and ASA 81 mg daily. ?-hold Plavix per neurology recommendations ?-ASA 81 mg daily ? ?HLD ?Last lipid panel 03/2015, was repeated on admission.  Lipid panel shows total cholesterol 163, LDL 110.  Unsure if pt takes statin at home.  ?-wife to bring home meds ? ?COPD  tobacco use disorder ?CXR showed no acute cardiopulmonary abnormality.  Home medications include albuterol ?-2 puffs every 6 hours as needed. ?-Incentive spirometry ?-Albuterol every 6 hours as needed ?-Nicotine patch daily ? ?HTN ?Home medications listed include HCTZ 12.5 mg daily, losartan 50 mg daily.  BP this morning is 121/77. Pt does state he takes medication for blood pressure but unable to confirm which one(s).  ?-Continue home meds ? ?FEN/GI: Heart healthy diet  ?PPx: Lovenox ?Dispo:Plan for home tomorrow after medications are figured out and new meds started ? ?  Subjective:  ?Pt states he is feeling fine this morning.  He understands he is  ? ?Objective: ?Temp:  [98.2 ?F (36.8 ?C)-101.2 ?F (38.4 ?C)] 98.2 ?F (36.8 ?C) (03/06 0328) ?Pulse Rate:  [78-117] 78 (03/06 0328) ?Resp:  [16-23] 20 (03/06 0328) ?BP: (112-169)/(64-87) 112/78 (03/06  0328) ?SpO2:  [93 %-99 %] 96 % (03/06 0328) ?Weight:  [81.6 kg] 81.6 kg (03/05 1258) ?Physical Exam: ?General: Patient sitting up in bed, NAD ?Cardiovascular: RRR, normal S1/S2 ?Respiratory: CTAB, normal effort ?Abdomen: Soft, nontender to palpation, nondistended ?Extremities: 5/5 muscle strength in bilateral upper and lower extremities ?Neuro: Cranial nerves II through XII intact, sensation intact ? ?Laboratory: ?Recent Labs  ?Lab 08/01/21 ?1419 08/02/21 ?0346  ?WBC 9.9 8.5  ?HGB 15.3 14.2  ?HCT 45.0 41.0  ?PLT 181 174  ? ?Recent Labs  ?Lab 08/01/21 ?1419 08/02/21 ?0346  ?NA 138 139  ?K 4.0 3.4*  ?CL 105 107  ?CO2 22 24  ?BUN 12 11  ?CREATININE 1.04 1.07  ?CALCIUM 8.9 8.7*  ?PROT 7.1  --   ?BILITOT 0.7  --   ?ALKPHOS 75  --   ?ALT 23  --   ?AST 27  --   ?GLUCOSE 112* 97  ? ? ? ? ?Erick Alley, DO ?08/02/2021, 5:04 AM ?PGY-1, [facility] ?FPTS Intern pager: [phone], text pages welcome ? ?

## 2021-08-02 NOTE — Evaluation (Signed)
Occupational Therapy Evaluation ?Patient Details ?Name: GIANKARLO Holden ?MRN: [id number] ?DOB: 12-30-1955 ?Today's Date: 08/02/2021 ? ? ?History of Present Illness Craig Holden is a 66 y.o. male presenting with altered mental status . PMH is significant for HLD, hx stroke, hx seizures, COPD, tobacco and marijuana use, HTN, R hip injury.  ? ?Clinical Impression ?  ?Craig Holden was evaluated s/p the above admission list. He reports being mod I at baseline, does not work but drives and completes most IADLs. He lives at home with his spouse who can assist as needed. Pt was supervision-mod I for all Adls and functional mobility this session. He had limited insight to safety and deficits and required cues to decrease fall risk. Pt reports that he feels he is at his functional baseline. He will benefit from OT acutely to address the concerns listed below. Recommend d/c to home without OT follow up.  ?   ? ?Recommendations for follow up therapy are one component of a multi-disciplinary discharge planning process, led by the attending physician.  Recommendations may be updated based on patient status, additional functional criteria and insurance authorization.  ? ?Follow Up Recommendations ? No OT follow up  ?  ?Assistance Recommended at Discharge    ?Patient can return home with the following A little help with walking and/or transfers;A little help with bathing/dressing/bathroom;Help with stairs or ramp for entrance;Assist for transportation;Assistance with cooking/housework ? ?  ?Functional Status Assessment ? Patient has had a recent decline in their functional status and demonstrates the ability to make significant improvements in function in a reasonable and predictable amount of time.  ?Equipment Recommendations ? BSC/3in1  ?  ?   ?Precautions / Restrictions Precautions ?Precautions: Fall ?Precaution Comments: pt used RW PTA, impaired balance from previous hip surgery ?Restrictions ?Weight Bearing Restrictions: No  ? ?   ? ?Mobility Bed Mobility ?  ?  ?  ?  ?  ?  ?  ?General bed mobility comments: in chair upon arrival ?  ? ?Transfers ?Overall transfer level: Needs assistance ?Equipment used: Rolling walker (2 wheels) ?Transfers: Sit to/from Stand ?Sit to Stand: Supervision ?  ?  ?  ?  ?  ?General transfer comment: verbal cues for hand placement, slightly impulsive ?  ? ?  ?Balance Overall balance assessment: Needs assistance ?Sitting-balance support: Feet supported, No upper extremity supported ?Sitting balance-Leahy Scale: Good ?  ?  ?Standing balance support: Single extremity supported, During functional activity ?Standing balance-Leahy Scale: Fair ?Standing balance comment: can statically stand with 1 UE supported ?  ?  ?  ?  ?  ?  ?  ?  ?  ?  ?  ?   ? ?ADL either performed or assessed with clinical judgement  ? ?ADL Overall ADL's : Modified independent;At baseline ?  ?  ?  ?  ?  ?  ?  ?  ?  ?  ?  ?  ?  ?  ?  ?  ?  ?  ?  ?General ADL Comments: mod I for ADLs with come concerns for fall/safety. Pt states he is at his functional baseline. Wife assists as needed. Limited by baseline LLE injury  ? ? ? ?Vision Baseline Vision/History: 0 No visual deficits ?Ability to See in Adequate Light: 0 Adequate ?Vision Assessment?: No apparent visual deficits  ?   ?   ?   ? ?Pertinent Vitals/Pain Pain Assessment ?Pain Assessment: No/denies pain  ? ? ? ?Hand Dominance Right ?  ?Extremity/Trunk Assessment Upper  Extremity Assessment ?Upper Extremity Assessment: Overall WFL for tasks assessed ?  ?Lower Extremity Assessment ?Lower Extremity Assessment: Defer to PT evaluation ?LLE Deficits / Details: weakness from previous injury ?  ?Cervical / Trunk Assessment ?Cervical / Trunk Assessment: Normal ?  ?Communication Communication ?Communication: No difficulties ?  ?Cognition Arousal/Alertness: Awake/alert ?Behavior During Therapy: Impulsive ?Overall Cognitive Status: Within Functional Limits for tasks assessed ?  ?  ?  ?General Comments: eager to d/c  home, poor insight to safety and deficits. Rushing during tasks leading to increased fall risk ?  ?  ?General Comments  VSS on RA, wife present ? ?  ?   ?   ? ? ?Home Living Family/patient expects to be discharged to:: Private residence ?Living Arrangements: Spouse/significant other ?Available Help at Discharge: Family;Available 24 hours/day ?Type of Home: House ?Home Access: Ramped entrance ?  ?  ?Home Layout: Two level ?Alternate Level Stairs-Number of Steps: flight ?  ?Bathroom Shower/Tub: Walk-in shower ?  ?Bathroom Toilet: Standard ?Bathroom Accessibility: Yes ?  ?Home Equipment: Rollator (4 wheels);Shower seat;Grab bars - tub/shower;Hand held shower head;Electric scooter ?  ?  ?  ? ?  ?Prior Functioning/Environment Prior Level of Function : Needs assist;Driving ?  ?  ?  ?  ?  ?  ?Mobility Comments: uses a rollator for short distance ambulation, electric scooter for community ?ADLs Comments: wife sets up and assists as needed ?  ? ?  ?  ?OT Problem List: Decreased strength;Decreased range of motion;Decreased activity tolerance;Impaired balance (sitting and/or standing);Decreased safety awareness;Decreased knowledge of use of DME or AE;Decreased knowledge of precautions ?  ?   ?OT Treatment/Interventions: Therapeutic exercise;Balance training;Patient/family education;Self-care/ADL training;Therapeutic activities  ?  ?OT Goals(Current goals can be found in the care plan section) Acute Rehab OT Goals ?Patient Stated Goal: home ?OT Goal Formulation: With patient ?Time For Goal Achievement: 08/16/21 ?Potential to Achieve Goals: Good ?ADL Goals ?Additional ADL Goal #1: pt will complete all BADLs with mod I ?Additional ADL Goal #2: Pt will indep recall at least 3 fall prevention strategies to apply to the home setting  ?OT Frequency: Min 2X/week ?  ? ?   ?AM-PAC OT "6 Clicks" Daily Activity     ?Outcome Measure Help from another person eating meals?: None ?Help from another person taking care of personal grooming?: A  Little ?Help from another person toileting, which includes using toliet, bedpan, or urinal?: A Little ?Help from another person bathing (including washing, rinsing, drying)?: A Little ?Help from another person to put on and taking off regular upper body clothing?: None ?Help from another person to put on and taking off regular lower body clothing?: A Little ?6 Click Score: 20 ?  ?End of Session Equipment Utilized During Treatment: Gait belt;Rolling walker (2 wheels) ? ?Activity Tolerance:   ?Patient left:   ? ?OT Visit Diagnosis: Unsteadiness on feet (R26.81);Other abnormalities of gait and mobility (R26.89);Muscle weakness (generalized) (M62.81)  ?              ?Time: 3818-2993 ?OT Time Calculation (min): 12 min ?Charges:  OT General Charges ?$OT Visit: 1 Visit ?OT Evaluation ?$OT Eval Low Complexity: 1 Low ? ? ? ?Miya Luviano A Derrall Hicks ?08/02/2021, 1:51 PM ?

## 2021-08-02 NOTE — Progress Notes (Signed)
Patient sleeping and resting comfortably.  Rounded with primary RN.  No seizure activity overnight. No concerns voiced.  No orders required.  Appreciated nightly round. ? ?Today's Vitals  ? 08/01/21 1915 08/01/21 2021 08/01/21 2341 08/02/21 0328  ?BP: 130/72 (!) 141/76 131/74 112/78  ?Pulse: 87 94 95 78  ?Resp: (!) 21 16 20 20   ?Temp:  99.4 ?F (37.4 ?C) 99.9 ?F (37.7 ?C) 98.2 ?F (36.8 ?C)  ?TempSrc:  Oral Oral Oral  ?SpO2: 94% 94% 95% 96%  ?Weight:      ?Height:      ?PainSc:   0-No pain   ? ?Body mass index is 23.43 kg/m?.  ? ?Monitor for seizure activity ?Neurology following, appreciate recommendations ? ? , MD ?[facility]    ?

## 2021-08-02 NOTE — Consult Note (Addendum)
Neurology Consult H&P ? ?Craig Holden ?MR# [id number] ?08/02/2021 ? ? ?CC: Seizure ? ?History is obtained from: Patient, primary team and chart. ? ?HPI: Craig Holden is a 66 y.o. male PMHx as reviewed below,  Hx of seizures after stroke not taking any AEDs at home and found unresponsive on couch ?twitching. Confused on arrival.  ? ?Seems like self-discontinued ASM rather than him being taken off.  ? ?Was loaded with Keppra. Seems like he was having headache with Keppra in the past. A plan of care note from December states he was switched to Dilantin but not taking anything at home. He also seems to have fevered here but no leukocytosis. ? ?ROS: A complete ROS was performed and is negative except as noted in the HPI.  ? ?Past Medical History:  ?Diagnosis Date  ? CVA (cerebral infarction)   ? Depression   ? Hypertension   ? Migraine   ? Nerve damage   ? PTSD (post-traumatic stress disorder)   ? ?Family History  ?Problem Relation Age of Onset  ? Heart attack Father   ? Emphysema Father   ? Hypertension Mother   ? Diabetes Mother   ? ?Social History:  reports that he has been smoking cigarettes. He has been smoking an average of 1 pack per day. He has never used smokeless tobacco. He reports current drug use. Drug: Marijuana. He reports that he does not drink alcohol. ? ? ?Prior to Admission medications   ?Medication Sig Start Date End Date Taking? Authorizing Provider  ?albuterol (PROVENTIL HFA;VENTOLIN HFA) 108 (90 BASE) MCG/ACT inhaler Inhale 1-2 puffs into the lungs every 6 (six) hours as needed for wheezing. 06/12/11   Palumbo, April, MD  ?clopidogrel (PLAVIX) 75 MG tablet Take 1 tablet (75 mg total) by mouth daily with breakfast. 04/21/15   Donzetta Starch, NP  ?feeding supplement (ENSURE ENLIVE / ENSURE PLUS) LIQD Take 237 mLs by mouth 2 (two) times daily between meals. 01/24/21   Elgergawy, Silver Huguenin, MD  ?hydrochlorothiazide (HYDRODIURIL) 25 MG tablet Take 12.5 mg by mouth daily.     [provider]   ?losartan (COZAAR) 100 MG tablet Take 50 mg by mouth daily.    [provider]  ?nicotine (NICODERM CQ - DOSED IN MG/24 HOURS) 21 mg/24hr patch Place 1 patch (21 mg total) onto the skin daily. 01/24/21   Elgergawy, Silver Huguenin, MD  ?ondansetron (ZOFRAN ODT) 4 MG disintegrating tablet Take 1 tablet (4 mg total) by mouth every 8 (eight) hours as needed for nausea. 07/02/16   Noemi Chapel, MD  ?phenytoin (DILANTIN) 100 MG ER capsule Take 1 capsule (100 mg total) by mouth 3 (three) times daily. 05/02/21   Veryl Speak, MD  ? ? ?Exam: ?Current vital signs: ?BP 131/74 (BP Location: Right Arm)   Pulse 95   Temp 99.9 ?F (37.7 ?C) (Oral)   Resp 20   Ht 6' 1.5" (1.867 m)   Wt 81.6 kg   SpO2 95%   BMI 23.43 kg/m?  ? ?Physical Exam  ?Constitutional: Appears well-developed and well-nourished.  ?Psych: Affect appropriate to situation ?Eyes: No scleral injection ?HENT: No OP obstruction. ?Head: Normocephalic.  ?Cardiovascular: Normal rate and regular rhythm.  ?Respiratory: Effort normal, symmetric excursions bilaterally, no audible wheezing. ?GI: Soft.  No distension. There is no tenderness.  ?Skin: WDI ? ?Neuro: Patient is not very happy and chose not to cooperate completely with the exam. ?Mental Status: Patient is sleeping and wakes to voice, oriented to person, place,  month, year, and situation. ?Speech fluent, intact comprehension. ?Visual Fields are full. Pupils are equal, round, and reactive to light. ?EOMI without ptosis or diplopia.  ?Facial sensation is symmetric to temperature ?Facial movement is symmetric.  ?Hearing is intact to voice. ?Uvula midline and palate elevates symmetrically. ?Shoulder shrug is symmetric. ?Tongue is midline without atrophy or fasciculations.  ?Tone is normal. Bulk is normal.  Moves all extremities consistently sensation is symmetric to light touch and temperature in the arms and legs. ?Deep Tendon Reflexes: He preferred not t ?Gait - Deferred ? ?I have reviewed labs in epic and the  pertinent results are: ?K 3.4 ? ?I have reviewed the images obtained: ?NCT head showed no acute intracranial abnormality. ? ?EEG 2022 read as normal ? ?Assessment: Craig Holden is a 66 y.o. male PMHx as noted above with seizure disorder.  Currently has no signs/symptoms of seizure and in the setting of previous seizure disorder may not need EEG. ? ?Primary team uncovered that the patient is on valproic acid.  Is not clear if the patient is actually taking valproic acid and serum level pending. ? ?Plan: ?-Valproic acid serum pending.   ?-If serum valproic acid is 0 consider restarting previous dose. ?Recommend starting Zonisamide 542m q6h x 2 doses and then 3033mnightly.  ?- Neurology will remain available, please call for questions. ? ? ? ?Electronically signed by:  ?HuLynnae SandhoffMD ?Page: [id number]/10/2021, 2:15 AM ? ?If 7pm- 7am, please page neurology on call as listed in [facility]? ?

## 2021-08-02 NOTE — Evaluation (Signed)
Physical Therapy Evaluation ?Patient Details ?Name: Craig Holden ?MRN: [id number] ?DOB: 1955/06/25 ?Today's Date: 08/02/2021 ? ?History of Present Illness ? Craig Holden is a 66 y.o. male presenting with altered mental status . PMH is significant for HLD, hx stroke, hx seizures, COPD, tobacco and marijuana use, HTN, R hip injury. ?  ?Clinical Impression ? Pt functioning near baseline. Pt able to ambulate with RW with min guard. Pt and spouse reports of pt only walking short distances due to his balance impairment and using an electric scooter in the community. Chair lift available in the house to transport pt upstairs. Pt with good home set up and support. Pt with impulsivity and decreased awareness however pt eager to be d/c'd and suspect this to be baseline behavior. Pt to benefit from use of BSC over toilet as pt is 6'1 and has difficulty getting up from toilet. Acute PT to continue monitor patient to maintain mobility and prevent deconditioning. ?   ? ?Recommendations for follow up therapy are one component of a multi-disciplinary discharge planning process, led by the attending physician.  Recommendations may be updated based on patient status, additional functional criteria and insurance authorization. ? ?Follow Up Recommendations No PT follow up ? ?  ?Assistance Recommended at Discharge Frequent or constant Supervision/Assistance  ?Patient can return home with the following ? A little help with walking and/or transfers;A little help with bathing/dressing/bathroom;Assistance with cooking/housework;Direct supervision/assist for medications management;Assist for transportation;Help with stairs or ramp for entrance ? ?  ?Equipment Recommendations BSC/3in1  ?Recommendations for Other Services ?    ?  ?Functional Status Assessment Patient has had a recent decline in their functional status and demonstrates the ability to make significant improvements in function in a reasonable and predictable amount of time.   ? ?  ?Precautions / Restrictions Precautions ?Precautions: Fall ?Precaution Comments: pt used RW PTA, impaired balance from previous hip surgery ?Restrictions ?Weight Bearing Restrictions: No  ? ?  ? ?Mobility ? Bed Mobility ?Overal bed mobility: Modified Independent ?  ?  ?  ?  ?  ?  ?General bed mobility comments: hob slightly elevated, quickly got selt to EOB ?  ? ?Transfers ?Overall transfer level: Needs assistance ?Equipment used: Rolling walker (2 wheels) ?Transfers: Sit to/from Stand ?Sit to Stand: Min guard ?  ?  ?  ?  ?  ?General transfer comment: verbal cues to slow down and to push up from bed/arm rests, not pull up on walker, wife and pt understood ?  ? ?Ambulation/Gait ?Ambulation/Gait assistance: Min guard ?Gait Distance (Feet): 75 Feet ?Assistive device: Rolling walker (2 wheels) ?Gait Pattern/deviations: Step-through pattern, Decreased stride length, Narrow base of support ?Gait velocity: appropriate ?Gait velocity interpretation: <1.31 ft/sec, indicative of household ambulator ?  ?General Gait Details: pt with increased dependence on UEs, pt with noted R LE genu valgus, narrow base of support with [place] gait, pt and wife reports this to be his "normal gait" ? ?Stairs ?  ?  ?  ?  ?  ? ?Wheelchair Mobility ?  ? ?Modified Rankin (Stroke Patients Only) ?  ? ?  ? ?Balance Overall balance assessment: Needs assistance ?Sitting-balance support: Feet supported, No upper extremity supported ?Sitting balance-Leahy Scale: Good ?  ?  ?Standing balance support: Bilateral upper extremity supported, During functional activity ?Standing balance-Leahy Scale: Poor ?Standing balance comment: dependent on RW ?  ?  ?  ?  ?  ?  ?  ?  ?  ?  ?  ?   ? ? ? ?  Pertinent Vitals/Pain Pain Assessment ?Pain Assessment: No/denies pain  ? ? ?Home Living Family/patient expects to be discharged to:: Private residence ?Living Arrangements: Spouse/significant other ?Available Help at Discharge: Family;Available 24 hours/day ?Type  of Home: House ?Home Access: Ramped entrance ?  ?  ?Alternate Level Stairs-Number of Steps: flight (but has a chair lift) ?Home Layout: Two level ?Home Equipment: Rollator (4 wheels);Shower seat;Grab bars - tub/shower;Hand held shower head;Electric scooter ?   ?  ?Prior Function Prior Level of Function : Needs assist ?  ?  ?  ?  ?  ?  ?Mobility Comments: uses a rollator for short distance ambulation, electric scooter for community ?ADLs Comments: wife sets up and assists as needed ?  ? ? ?Hand Dominance  ? Dominant Hand: Right ? ?  ?Extremity/Trunk Assessment  ? Upper Extremity Assessment ?Upper Extremity Assessment: Overall WFL for tasks assessed ?  ? ?Lower Extremity Assessment ?Lower Extremity Assessment: LLE deficits/detail ?LLE Deficits / Details: weakness from previous injury ?  ? ?Cervical / Trunk Assessment ?Cervical / Trunk Assessment: Normal  ?Communication  ? Communication: No difficulties  ?Cognition Arousal/Alertness: Awake/alert ?Behavior During Therapy: Impulsive ?Overall Cognitive Status: Within Functional Limits for tasks assessed ?  ?  ?  ?  ?  ?  ?  ?  ?  ?  ?  ?  ?  ?  ?  ?  ?General Comments: pt eager to be discharged resulting in quick movements, decreased insight to lines ?  ?  ? ?  ?General Comments General comments (skin integrity, edema, etc.): WFL ? ?  ?Exercises    ? ?Assessment/Plan  ?  ?PT Assessment Patient needs continued PT services  ?PT Problem List Decreased strength;Decreased activity tolerance;Decreased balance;Decreased mobility;Decreased knowledge of use of DME ? ?   ?  ?PT Treatment Interventions DME instruction;Gait training;Stair training;Functional mobility training;Therapeutic activities;Therapeutic exercise;Balance training;Neuromuscular re-education   ? ?PT Goals (Current goals can be found in the Care Plan section)  ?Acute Rehab PT Goals ?Patient Stated Goal: home ASAP ?PT Goal Formulation: With patient/family ?Time For Goal Achievement: 08/16/21 ?Potential to Achieve  Goals: Good ? ?  ?Frequency Min 2X/week ?  ? ? ?Co-evaluation   ?  ?  ?  ?  ? ? ?  ?AM-PAC PT "6 Clicks" Mobility  ?Outcome Measure Help needed turning from your back to your side while in a flat bed without using bedrails?: None ?Help needed moving from lying on your back to sitting on the side of a flat bed without using bedrails?: None ?Help needed moving to and from a bed to a chair (including a wheelchair)?: A Little ?Help needed standing up from a chair using your arms (e.g., wheelchair or bedside chair)?: A Little ?Help needed to walk in hospital room?: A Little ?Help needed climbing 3-5 steps with a railing? : A Lot ?6 Click Score: 19 ? ?  ?End of Session Equipment Utilized During Treatment: Gait belt ?Activity Tolerance: Patient tolerated treatment well ?Patient left: in chair;with call bell/phone within reach;with family/visitor present ?Nurse Communication: Mobility status ?PT Visit Diagnosis: Unsteadiness on feet (R26.81);Muscle weakness (generalized) (M62.81);Difficulty in walking, not elsewhere classified (R26.2) ?  ? ?Time: 9476-5465 ?PT Time Calculation (min) (ACUTE ONLY): 14 min ? ? ?Charges:   PT Evaluation ?$PT Eval Low Complexity: 1 Low ?  ?  ?   ? ? ?Lewis Shock, PT, DPT ?Acute Rehabilitation Services ?Pager #: [phone] ?Office #: [phone] ? ? ?Craig Holden ?08/02/2021, 11:07 AM ? ?

## 2021-08-02 NOTE — TOC Transition Note (Addendum)
Transition of Care (TOC) - CM/SW Discharge Note ? ? ?Patient Details  ?Name: Craig Holden ?MRN: [id number] ?Date of Birth: 1955-06-20 ? ?Transition of Care (TOC) CM/SW Contact:  ?Kermit Balo, RN ?Phone Number: ?08/02/2021, 11:16 AM ? ? ?Clinical Narrative:    ?Patient will discharge home with self care when medically ready. No f/u per PT.  ?Pt has walker and cane at home. ?3 in 1 recommended and patient in agreement. [employer] will deliver the DME to the patients room.  ?PCP: Dr Patria Mane at [facility]  SWAlveda Reasons [phone] ext: [phone] ?Pt denies any issues with home medications or transportation.  ?Spouse is able to provide needed supervision at home. ?Wife will transport home once discharged.  ? ? ?Final next level of care: Home/Self Care ?Barriers to Discharge: No Barriers Identified ? ? ?Patient Goals and CMS Choice ?  ?CMS Medicare.gov Compare Post Acute Care list provided to:: Patient ?  ? ?Discharge Placement ?  ?           ?  ?  ?  ?  ? ?Discharge Plan and Services ?  ?  ?           ?DME Arranged: 3-N-1 ?DME Agency: [employer] ?Date DME Agency Contacted: 08/02/21 ?  ?Representative spoke with at DME Agency: Velna Hatchet ?  ?  ?  ?  ?  ? ?Social Determinants of Health (SDOH) Interventions ?  ? ? ?Readmission Risk Interventions ?No flowsheet data found. ? ? ? ? ?

## 2021-08-03 ENCOUNTER — Other Ambulatory Visit (HOSPITAL_COMMUNITY): Payer: Self-pay

## 2021-08-03 LAB — BASIC METABOLIC PANEL
Anion gap: 10 (ref 5–15)
BUN: 13 mg/dL (ref 8–23)
CO2: 20 mmol/L — ABNORMAL LOW (ref 22–32)
Calcium: 8.8 mg/dL — ABNORMAL LOW (ref 8.9–10.3)
Chloride: 107 mmol/L (ref 98–111)
Creatinine, Ser: 1.15 mg/dL (ref 0.61–1.24)
GFR, Estimated: >60 mL/min (ref >60)
Glucose, Bld: 88 mg/dL (ref 70–99)
Potassium: 3.5 mmol/L (ref 3.5–5.1)
Sodium: 137 mmol/L (ref 135–145)

## 2021-08-03 LAB — CBC
HCT: 40.6 % (ref 39.0–52.0)
Hemoglobin: 14.2 g/dL (ref 13.0–17.0)
MCH: 33.2 pg (ref 26.0–34.0)
MCHC: 35 g/dL (ref 30.0–36.0)
MCV: 94.9 fL (ref 80.0–100.0)
Platelets: 161 10*3/uL (ref 150–400)
RBC: 4.28 MIL/uL (ref 4.22–5.81)
RDW: 14.3 % (ref 11.5–15.5)
WBC: 7 10*3/uL (ref 4.0–10.5)
nRBC: 0 % (ref 0.0–0.2)

## 2021-08-03 LAB — URINE CULTURE: Culture: NO GROWTH

## 2021-08-03 MED ORDER — DIVALPROEX SODIUM ER 500 MG PO TB24
1000.0000 mg | ORAL_TABLET | Freq: Every day | ORAL | 0 refills | Status: DC
  Filled 2021-08-03: qty 30, 15d supply, fill #0

## 2021-08-03 MED ORDER — ATORVASTATIN CALCIUM 80 MG PO TABS: 80.0000 mg | ORAL_TABLET | Freq: Every day | ORAL | 0 refills | Status: DC

## 2021-08-03 MED ORDER — DIVALPROEX SODIUM ER 500 MG PO TB24: 1000.0000 mg | ORAL_TABLET | Freq: Every day | ORAL | 0 refills | Status: DC

## 2021-08-03 MED ORDER — ATORVASTATIN CALCIUM 80 MG PO TABS
80.0000 mg | ORAL_TABLET | Freq: Every day | ORAL | 0 refills | Status: DC
  Filled 2021-08-03: qty 30, 30d supply, fill #0

## 2021-08-03 MED ORDER — ZONISAMIDE 100 MG PO CAPS: 300.0000 mg | ORAL_CAPSULE | Freq: Every day | ORAL | 0 refills | Status: DC

## 2021-08-03 MED ORDER — ZONISAMIDE 100 MG PO CAPS
300.0000 mg | ORAL_CAPSULE | Freq: Every day | ORAL | 0 refills | Status: DC
  Filled 2021-08-03: qty 90, 30d supply, fill #0

## 2021-08-03 NOTE — Progress Notes (Signed)
FPTS Interim Night Progress Note ? ?S:Patient sleeping comfortably.  Rounded with primary night RN. No seizure activity overnight. No concerns voiced.  No orders required.   ? ?O: ?Today's Vitals  ? 08/02/21 1922 08/02/21 1942 08/02/21 2336 08/03/21 0314  ?BP: 138/88  124/83 130/72  ?Pulse: 78  73 74  ?Resp: 15  15 15   ?Temp: 98.9 ?F (37.2 ?C)  (!) 97.5 ?F (36.4 ?C) 98 ?F (36.7 ?C)  ?TempSrc: Oral  Oral Oral  ?SpO2: 99%  97% 99%  ?Weight:      ?Height:      ?PainSc:  0-No pain    ? ? ? ? ?A/P: ?Blood cultures NGTD <24hrs. ?Continue current management ? ? MD ?PGY-3, [facility] ?Service pager [phone]   ?

## 2021-08-03 NOTE — Hospital Course (Addendum)
Craig Holden is a 66 y.o.male with a history of seizures, strokes, HLD, COPD, tobacco and marijuana use, HTN, R hip injury who was admitted to the [facility] Teaching Service at [facility] for altered mental status. His hospital course is detailed below: ? ?Altered mental status  history of seizures ?Patient presented to the ED febrile to 101.2 rectally and with altered mental status with questionable seizure-like activity prior to arriving to the ED. patient received initial dose of ceftriaxone and Ativan.  CBC and BMP normal in addition to CK.  Patient endorsed adherence to medication however phenytoin and valproic acid level low. Neurology was consulted and advised patient discontinue Dilantin, restart valproic acid and start zonisamide.  Patient received loading doses of zonisamide and valproic acid.  Patient's altered mental status improved within the first day of hospitalization. Patient was empirically treated with CTX for 2 days which was stopped due to negative blood cultures times 2 days and no signs of infection other than initial fever on arrival which resolved and did not reoccur. Pt was discharged on Valproic acid 1,000 daily and Zonisamide 300mg  at bedtime. ? ?HLD  Hx CVA ?Pt stated he was not taking his home atorvastatin. He was discharged on Atorvastatin 80 mg daily. Plavix was d/c'd and pt was kept on ASA 81mg  daily. ? ? ?Other chronic conditions were medically managed with home medications and formulary alternatives as necessary (COPD, HTN) ? ?PCP Follow-up Recommendations: ?Ensure pt is taking medications appropriately ? ? ?

## 2021-08-03 NOTE — Discharge Instructions (Addendum)
Dear Craig Holden, ? ?Thank you for letting us participate in your care. You were hospitalized for altered mental status and diagnosed with Encephalopathy acute. We think your altered mental status could be related to your seizure disorder and not taking your seizure medications regularly. See below for instructions on how to take your medications ? ?POST-HOSPITAL & CARE INSTRUCTIONS ?-We increased her dose of valproic acid (Depakote) to 1000 mg daily  ?-We started you on a new seizure medication called zonisamide 300 mg (3 tablets) at bedtime ?-We increased your atorvastatin to 80 mg daily ?-Continue to take aspirin 81 mg daily ?-Continue to take your blood pressure medication (hydrochlorothiazide and losartan) ? ? ?DOCTOR'S APPOINTMENT   ?Future Appointments  ?Date Time Provider Department Center  ?08/23/2021  1:45 PM Windell Norfolk, MD [facility] None  ? ? ? ?Take care and be well! ? ?Family Medicine Teaching Service Inpatient Team ?[facility]  ?[facility]  ?[address] [place], [place] [address] ?[phone] ? ? ? ? ?

## 2021-08-03 NOTE — Discharge Summary (Signed)
Family Medicine Teaching Service ?Hospital Discharge Summary ? ?Patient name: Craig Holden Medical record number: [id number] ?Date of birth: 07/26/1955 Age: 66 y.o. Gender: male ?Date of Admission: 08/01/2021  Date of Discharge: 08/03/21 ?Admitting Physician: Wells Guiles, DO ? ?Primary Care Provider: Nolene Ebbs, MD ?Consultants: Neurology ? ?Indication for Hospitalization: Altered mental status ? ?Discharge Diagnoses/Problem List:  ?Principal Problem: ?  Encephalopathy acute ?Active Problems: ?  Essential hypertension ?  History of stroke ?  HLD (hyperlipidemia) ?  Tobacco use disorder ?  Marijuana abuse ?  Seizures ([place]) ?  Seizure ([place]) ?  Chronic obstructive pulmonary disease (HCC) ?  Mild recurrent major depression ([place]) ?  Acute encephalopathy ?  Fever ? ? ? ?Disposition: home ? ?Discharge Condition: stable ? ?Discharge Exam:  ?General: Pt sitting up in bed, pleasant to speak with, NAD ?Cardio: RRR, normal S1/S2 ?Respiratory: CTAB, normal effort ?Neuro: CN II-XII intact, sensation intact, alert and oriented x4 ? ?Brief Hospital Course:  ?Craig Holden is a 66 y.o.male with a history of seizures, strokes, HLD, COPD, tobacco and marijuana use, HTN, R hip injury who was admitted to the [place] for altered mental status. His hospital course is detailed below: ? ?Altered mental status  history of seizures ?Patient presented to the ED febrile to 101.2 rectally and with altered mental status with questionable seizure-like activity prior to arriving to the ED. patient received initial dose of ceftriaxone and Ativan.  CBC and BMP normal in addition to CK.  Patient endorsed adherence to medication however phenytoin and valproic acid level low. Neurology was consulted and advised patient discontinue Dilantin, restart valproic acid and start zonisamide.  Patient received loading doses of zonisamide and valproic acid.  Patient's altered mental status improved within the first day of  hospitalization. Patient was empirically treated with CTX for 2 days which was stopped due to negative blood cultures times 2 days and no signs of infection other than initial fever on arrival which resolved and did not reoccur. Pt was discharged on Valproic acid 1,000 daily and Zonisamide 300mg  at bedtime. ? ?HLD  Hx CVA ?Pt stated he was not taking his home atorvastatin. He was discharged on Atorvastatin 80 mg daily. Plavix was d/c'd and pt was kept on ASA 81mg  daily. ? ? ?Other chronic conditions were medically managed with home medications and formulary alternatives as necessary (COPD, HTN) ? ?PCP Follow-up Recommendations: ?Ensure pt is taking medications appropriately ? ? ? ? ?Significant Procedures: none ? ?Significant Labs and Imaging:  ?Recent Labs  ?Lab 08/01/21 ?1419 08/02/21 ?0346 08/03/21 ?[id number]  ?WBC 9.9 8.5 7.0  ?HGB 15.3 14.2 14.2  ?HCT 45.0 41.0 40.6  ?PLT 181 174 161  ? ?Recent Labs  ?Lab 08/01/21 ?1419 08/02/21 ?0346 08/03/21 ?[id number]  ?NA 138 139 137  ?K 4.0 3.4* 3.5  ?CL 105 107 107  ?CO2 22 24 20*  ?GLUCOSE 112* 97 88  ?BUN 12 11 13   ?CREATININE 1.04 1.07 1.15  ?CALCIUM 8.9 8.7* 8.8*  ?MG 1.8  --   --   ?ALKPHOS 75  --   --   ?AST 27  --   --   ?ALT 23  --   --   ?ALBUMIN 3.9  --   --   ? ? ? ? ?Results/Tests Pending at Time of Discharge: none ? ?Discharge Medications:  ?Allergies as of 08/03/2021   ? ?   Reactions  ? Morphine Sulfate Nausea And Vomiting  ? Sertraline Nausea And  Vomiting  ? ?  ? ?  ?Medication List  ?  ? ?STOP taking these medications   ? ?clopidogrel 75 MG tablet ?Commonly known as: PLAVIX ?  ?feeding supplement Liqd ?  ?mirtazapine 15 MG tablet ?Commonly known as: REMERON ?  ?phenytoin 100 MG ER capsule ?Commonly known as: DILANTIN ?  ? ?  ? ?TAKE these medications   ? ?acetaminophen 325 MG tablet ?Commonly known as: TYLENOL ?Take 650 mg by mouth 3 (three) times daily as needed for headache. ?  ?albuterol 108 (90 Base) MCG/ACT inhaler ?Commonly known as: VENTOLIN HFA ?Inhale 1-2  puffs into the lungs every 6 (six) hours as needed for wheezing. ?  ?aspirin EC 81 MG tablet ?Take 81 mg by mouth daily. Swallow whole. ?  ?atorvastatin 80 MG tablet ?Commonly known as: LIPITOR ?Take 1 tablet (80 mg total) by mouth at bedtime. ?What changed:  ?medication strength ?how much to take ?  ?divalproex 500 MG 24 hr tablet ?Commonly known as: DEPAKOTE ER ?Take 2 tablets (1,000 mg total) by mouth daily. ?What changed:  ?how much to take ?when to take this ?  ?fluticasone 50 MCG/ACT nasal spray ?Commonly known as: FLONASE ?Place 2 sprays into both nostrils daily as needed (nasal congestion). ?  ?hydrochlorothiazide 25 MG tablet ?Commonly known as: HYDRODIURIL ?Take 12.5 mg by mouth daily. ?  ?losartan 100 MG tablet ?Commonly known as: COZAAR ?Take 50 mg by mouth daily. ?  ?multivitamin tablet ?Take 1 tablet by mouth daily. ?  ?nicotine 21 mg/24hr patch ?Commonly known as: NICODERM CQ - dosed in mg/24 hours ?Place 1 patch (21 mg total) onto the skin daily. ?  ?nicotine polacrilex 2 MG lozenge ?Commonly known as: COMMIT ?Take 2 mg by mouth as needed for smoking cessation. ?  ?sildenafil 100 MG tablet ?Commonly known as: VIAGRA ?Take 100 mg by mouth daily as needed for erectile dysfunction. ?  ?vitamin B-12 500 MCG tablet ?Commonly known as: CYANOCOBALAMIN ?Take 500 mcg by mouth daily. ?  ?zonisamide 100 MG capsule ?Commonly known as: ZONEGRAN ?Take 3 capsules (300 mg total) by mouth at bedtime. ?  ? ?  ? ?  ?  ? ? ?  ?Durable Medical Equipment  ?(From admission, onward)  ?  ? ? ?  ? ?  Start     Ordered  ? 08/02/21 1059  For home use only DME 3 n 1  Once       ? 08/02/21 1058  ? ?  ?  ? ?  ? ? ?Discharge Instructions: Please refer to Patient Instructions section of EMR for full details.  Patient was counseled important signs and symptoms that should prompt return to medical care, changes in medications, dietary instructions, activity restrictions, and follow up appointments.  ? ?Follow-Up  Appointments: ? ? ?Precious Gilding, DO ?08/03/2021, 5:01 PM ?PGY-1, [facility] ? ? ?

## 2021-08-03 NOTE — Plan of Care (Signed)
?Problem: Education: ?Goal: Expressions of having a comfortable level of knowledge regarding the disease process will increase ?08/03/2021 1213 by Laure Kidney, RN ?Outcome: Adequate for Discharge ?08/03/2021 1213 by Laure Kidney, RN ?Outcome: Adequate for Discharge ?  ?Problem: Coping: ?Goal: Ability to adjust to condition or change in health will improve ?08/03/2021 1213 by Laure Kidney, RN ?Outcome: Adequate for Discharge ?08/03/2021 1213 by Laure Kidney, RN ?Outcome: Adequate for Discharge ?Goal: Ability to identify appropriate support needs will improve ?08/03/2021 1213 by Laure Kidney, RN ?Outcome: Adequate for Discharge ?08/03/2021 1213 by Laure Kidney, RN ?Outcome: Adequate for Discharge ?  ?Problem: Health Behavior/Discharge Planning: ?Goal: Compliance with prescribed medication regimen will improve ?08/03/2021 1213 by Laure Kidney, RN ?Outcome: Adequate for Discharge ?08/03/2021 1213 by Laure Kidney, RN ?Outcome: Adequate for Discharge ?  ?Problem: Medication: ?Goal: Risk for medication side effects will decrease ?08/03/2021 1213 by Laure Kidney, RN ?Outcome: Adequate for Discharge ?08/03/2021 1213 by Laure Kidney, RN ?Outcome: Adequate for Discharge ?  ?Problem: Clinical Measurements: ?Goal: Complications related to the disease process, condition or treatment will be avoided or minimized ?08/03/2021 1213 by Laure Kidney, RN ?Outcome: Adequate for Discharge ?08/03/2021 1213 by Laure Kidney, RN ?Outcome: Adequate for Discharge ?Goal: Diagnostic test results will improve ?08/03/2021 1213 by Laure Kidney, RN ?Outcome: Adequate for Discharge ?08/03/2021 1213 by Laure Kidney, RN ?Outcome: Adequate for Discharge ?  ?Problem: Safety: ?Goal: Verbalization of understanding the information provided will improve ?08/03/2021 1213 by Laure Kidney, RN ?Outcome: Adequate for Discharge ?08/03/2021 1213 by Laure Kidney, RN ?Outcome: Adequate for Discharge ?  ?Problem: Self-Concept: ?Goal: Level of anxiety will  decrease ?08/03/2021 1213 by Laure Kidney, RN ?Outcome: Adequate for Discharge ?08/03/2021 1213 by Laure Kidney, RN ?Outcome: Adequate for Discharge ?Goal: Ability to verbalize feelings about condition will improve ?08/03/2021 1213 by Laure Kidney, RN ?Outcome: Adequate for Discharge ?08/03/2021 1213 by Laure Kidney, RN ?Outcome: Adequate for Discharge ?  ?Problem: Education: ?Goal: Knowledge of General Education information will improve ?Description: Including pain rating scale, medication(s)/side effects and non-pharmacologic comfort measures ?08/03/2021 1213 by Laure Kidney, RN ?Outcome: Adequate for Discharge ?08/03/2021 1213 by Laure Kidney, RN ?Outcome: Adequate for Discharge ?  ?Problem: Health Behavior/Discharge Planning: ?Goal: Ability to manage health-related needs will improve ?08/03/2021 1213 by Laure Kidney, RN ?Outcome: Adequate for Discharge ?08/03/2021 1213 by Laure Kidney, RN ?Outcome: Adequate for Discharge ?  ?Problem: Clinical Measurements: ?Goal: Ability to maintain clinical measurements within normal limits will improve ?08/03/2021 1213 by Laure Kidney, RN ?Outcome: Adequate for Discharge ?08/03/2021 1213 by Laure Kidney, RN ?Outcome: Adequate for Discharge ?Goal: Will remain free from infection ?08/03/2021 1213 by Laure Kidney, RN ?Outcome: Adequate for Discharge ?08/03/2021 1213 by Laure Kidney, RN ?Outcome: Adequate for Discharge ?Goal: Diagnostic test results will improve ?08/03/2021 1213 by Laure Kidney, RN ?Outcome: Adequate for Discharge ?08/03/2021 1213 by Laure Kidney, RN ?Outcome: Adequate for Discharge ?Goal: Respiratory complications will improve ?08/03/2021 1213 by Laure Kidney, RN ?Outcome: Adequate for Discharge ?08/03/2021 1213 by Laure Kidney, RN ?Outcome: Adequate for Discharge ?Goal: Cardiovascular complication will be avoided ?08/03/2021 1213 by Laure Kidney, RN ?Outcome: Adequate for Discharge ?08/03/2021 1213 by Laure Kidney, RN ?Outcome: Adequate for Discharge ?   ?Problem: Activity: ?Goal: Risk for activity intolerance will decrease ?08/03/2021 1213 by Laure Kidney, RN ?Outcome: Adequate for Discharge ?  08/03/2021 1213 by Laure Kidney, RN ?Outcome: Adequate for Discharge ?  ?Problem: Nutrition: ?Goal: Adequate nutrition will be maintained ?08/03/2021 1213 by Laure Kidney, RN ?Outcome: Adequate for Discharge ?08/03/2021 1213 by Laure Kidney, RN ?Outcome: Adequate for Discharge ?  ?Problem: Coping: ?Goal: Level of anxiety will decrease ?08/03/2021 1213 by Laure Kidney, RN ?Outcome: Adequate for Discharge ?08/03/2021 1213 by Laure Kidney, RN ?Outcome: Adequate for Discharge ?  ?Problem: Elimination: ?Goal: Will not experience complications related to bowel motility ?08/03/2021 1213 by Laure Kidney, RN ?Outcome: Adequate for Discharge ?08/03/2021 1213 by Laure Kidney, RN ?Outcome: Adequate for Discharge ?Goal: Will not experience complications related to urinary retention ?08/03/2021 1213 by Laure Kidney, RN ?Outcome: Adequate for Discharge ?08/03/2021 1213 by Laure Kidney, RN ?Outcome: Adequate for Discharge ?  ?Problem: Pain Managment: ?Goal: General experience of comfort will improve ?08/03/2021 1213 by Laure Kidney, RN ?Outcome: Adequate for Discharge ?08/03/2021 1213 by Laure Kidney, RN ?Outcome: Adequate for Discharge ?  ?Problem: Safety: ?Goal: Ability to remain free from injury will improve ?08/03/2021 1213 by Laure Kidney, RN ?Outcome: Adequate for Discharge ?08/03/2021 1213 by Laure Kidney, RN ?Outcome: Adequate for Discharge ?  ?Problem: Skin Integrity: ?Goal: Risk for impaired skin integrity will decrease ?08/03/2021 1213 by Laure Kidney, RN ?Outcome: Adequate for Discharge ?08/03/2021 1213 by Laure Kidney, RN ?Outcome: Adequate for Discharge ?  ?

## 2021-08-06 LAB — CULTURE, BLOOD (ROUTINE X 2)
Culture: NO GROWTH
Culture: NO GROWTH
Special Requests: ADEQUATE
Special Requests: ADEQUATE

## 2021-08-23 ENCOUNTER — Encounter: Payer: Self-pay | Admitting: Neurology

## 2021-08-23 ENCOUNTER — Ambulatory Visit: Payer: Medicare Other | Admitting: Neurology

## 2021-08-31 ENCOUNTER — Other Ambulatory Visit: Payer: Self-pay

## 2021-08-31 ENCOUNTER — Observation Stay (HOSPITAL_COMMUNITY)
Admission: EM | Admit: 2021-08-31 | Discharge: 2021-09-01 | Disposition: A | Discharge status: Home / Self Care | Payer: No Typology Code available for payment source | Attending: Family Medicine | Admitting: Family Medicine

## 2021-08-31 ENCOUNTER — Emergency Department (HOSPITAL_COMMUNITY): Payer: No Typology Code available for payment source

## 2021-08-31 DIAGNOSIS — R569 Unspecified convulsions: Principal | ICD-10-CM

## 2021-08-31 DIAGNOSIS — Z7982 Long term (current) use of aspirin: Secondary | ICD-10-CM | POA: Diagnosis not present

## 2021-08-31 DIAGNOSIS — G40909 Epilepsy, unspecified, not intractable, without status epilepticus: Principal | ICD-10-CM | POA: Insufficient documentation

## 2021-08-31 DIAGNOSIS — I10 Essential (primary) hypertension: Secondary | ICD-10-CM | POA: Diagnosis not present

## 2021-08-31 DIAGNOSIS — J449 Chronic obstructive pulmonary disease, unspecified: Secondary | ICD-10-CM | POA: Insufficient documentation

## 2021-08-31 DIAGNOSIS — Z79899 Other long term (current) drug therapy: Secondary | ICD-10-CM | POA: Insufficient documentation

## 2021-08-31 DIAGNOSIS — F17210 Nicotine dependence, cigarettes, uncomplicated: Secondary | ICD-10-CM | POA: Insufficient documentation

## 2021-08-31 LAB — BASIC METABOLIC PANEL
Anion gap: 10 (ref 5–15)
BUN: 7 mg/dL — ABNORMAL LOW (ref 8–23)
CO2: 23 mmol/L (ref 22–32)
Calcium: 9.1 mg/dL (ref 8.9–10.3)
Chloride: 105 mmol/L (ref 98–111)
Creatinine, Ser: 1.12 mg/dL (ref 0.61–1.24)
GFR, Estimated: >60 mL/min (ref >60)
Glucose, Bld: 89 mg/dL (ref 70–99)
Potassium: 4.1 mmol/L (ref 3.5–5.1)
Sodium: 138 mmol/L (ref 135–145)

## 2021-08-31 LAB — CBC WITH DIFFERENTIAL/PLATELET
Abs Immature Granulocytes: 0.03 10*3/uL (ref 0.00–0.07)
Basophils Absolute: 0 10*3/uL (ref 0.0–0.1)
Basophils Relative: 0 %
Eosinophils Absolute: 0 10*3/uL (ref 0.0–0.5)
Eosinophils Relative: 0 %
HCT: 46.8 % (ref 39.0–52.0)
Hemoglobin: 15.8 g/dL (ref 13.0–17.0)
Immature Granulocytes: 0 %
Lymphocytes Relative: 19 %
Lymphs Abs: 1.7 10*3/uL (ref 0.7–4.0)
MCH: 32.8 pg (ref 26.0–34.0)
MCHC: 33.8 g/dL (ref 30.0–36.0)
MCV: 97.1 fL (ref 80.0–100.0)
Monocytes Absolute: 1 10*3/uL (ref 0.1–1.0)
Monocytes Relative: 11 %
Neutro Abs: 6.2 10*3/uL (ref 1.7–7.7)
Neutrophils Relative %: 70 %
Platelets: 156 10*3/uL (ref 150–400)
RBC: 4.82 MIL/uL (ref 4.22–5.81)
RDW: 14.3 % (ref 11.5–15.5)
WBC: 9 10*3/uL (ref 4.0–10.5)
nRBC: 0 % (ref 0.0–0.2)

## 2021-08-31 LAB — VALPROIC ACID LEVEL: Valproic Acid Lvl: <10 ug/mL — ABNORMAL LOW (ref 50.0–100.0)

## 2021-08-31 MED ORDER — LOSARTAN POTASSIUM 50 MG PO TABS
50.0000 mg | ORAL_TABLET | Freq: Every day | ORAL | Status: DC
  Administered 2021-09-01: 50 mg via ORAL
  Filled 2021-08-31: qty 1

## 2021-08-31 MED ORDER — ATORVASTATIN CALCIUM 80 MG PO TABS
80.0000 mg | ORAL_TABLET | Freq: Every day | ORAL | Status: DC
  Administered 2021-09-01: 80 mg via ORAL
  Filled 2021-08-31: qty 1

## 2021-08-31 MED ORDER — LEVETIRACETAM 500 MG PO TABS
500.0000 mg | ORAL_TABLET | Freq: Two times a day (BID) | ORAL | Status: DC
  Administered 2021-09-01: 500 mg via ORAL
  Filled 2021-08-31: qty 1

## 2021-08-31 MED ORDER — ASPIRIN EC 81 MG PO TBEC
81.0000 mg | DELAYED_RELEASE_TABLET | Freq: Every day | ORAL | Status: DC
  Administered 2021-09-01: 81 mg via ORAL
  Filled 2021-08-31: qty 1

## 2021-08-31 MED ORDER — LEVETIRACETAM IN NACL 1500 MG/100ML IV SOLN
1500.0000 mg | Freq: Once | INTRAVENOUS | Status: AC
  Administered 2021-08-31: 1500 mg via INTRAVENOUS
  Filled 2021-08-31: qty 100

## 2021-08-31 MED ORDER — HYDROCHLOROTHIAZIDE 12.5 MG PO TABS
12.5000 mg | ORAL_TABLET | Freq: Every day | ORAL | Status: DC
  Administered 2021-09-01: 12.5 mg via ORAL
  Filled 2021-08-31: qty 1

## 2021-08-31 MED ORDER — NICOTINE 14 MG/24HR TD PT24
14.0000 mg | MEDICATED_PATCH | Freq: Every day | TRANSDERMAL | Status: DC
  Administered 2021-09-01: 14 mg via TRANSDERMAL
  Filled 2021-08-31: qty 1

## 2021-08-31 MED ORDER — ENOXAPARIN SODIUM 40 MG/0.4ML IJ SOSY
40.0000 mg | PREFILLED_SYRINGE | INTRAMUSCULAR | Status: DC
  Administered 2021-09-01: 40 mg via SUBCUTANEOUS
  Filled 2021-08-31: qty 0.4

## 2021-08-31 NOTE — H&P (Addendum)
Family Medicine Teaching Service ?Hospital Admission History and Physical ?Service Pager: [phone] ? ?Patient name: Craig Holden Medical record number: [id number] ?Date of birth: [birth year] Age: 66 y.o. Gender: male ? ?Primary Care Provider: Fleet Contras, MD ?Consultants: Neurology ?Code Status: Full code  ?Preferred Emergency Contact: Ceasar Mons (wife) [phone] ? ?Chief Complaint: Seizure like activity ? ?Assessment and Plan: ?Craig Holden is a 66 y.o. male presenting with seizure like activity . PMH is significant for seizures, stroke, COPD, HTN. ? ? ?Seizure like activity  Mental status changes  Hx of Seizure disorder ?Patient brought in by EMS s/p witnesses tonic clonic seizures, lasting 1.5-2 min, per chart review. Patient was post ictal when EMS arrived, but then had another seizure where midazolam 5 mg was given. On arrival vitals were stable with pulse 90, BP 140/73, Sat 98%, afebrile. CBC was WNL, and BMP was unremarkable. CT head was negative, with a normal CXR. Differential for seizure like activity includes seizure likely 2/2 to medication non-adherence as demonstrated by valproic acid below therapeutic level of <10. Patient reports not taking his medication for last 3 months. Patient has been afebrile without infectious symptoms, making fever/infection less likely etiology for seizure. Patient BMP WNL with glc 89 on arrival, making hypoglycemia less likely as cause for seizure. Patient BMP WNL making electrolyte derangement less likely etiology of seizure. Patient could also have suffered a stroke, but is less likely given imaging and lack of FND. Patient admitted for mental status changes, following seizure like activity, but appears to be at his baseline on admission. Mental status changes are improved and were likely 2/2 to post-ictal state, but may represent new baseline in setting of dementia. May consider EEG monitoring although likely will not change management but will defer  to neurology. Will admit for observation and monitoring. Will need to continue to encourage compliance of anti-epileptic regimen and will likely require routine neurology follow up outpatient.  ?-Admit to [facility]PTS, Attending Dr. Miquel Dunn  ?-Neurology following, appreciate recs ?-S/p Keppra loading dose 1500 mg x 1 ?-Start Keppra 500 mg BID in the am per neuro  ?-Ativan 4 mg prn for seizures lasting >5 min  ?-am labs  ?-PT/OT ?-Bedside swallow eval ?-Seizure precautions ?-Up with assistance ?-AM CBC, BMP ?-Q4h Neuro checks ?-UDS ? ?HTN ?Chronic. BP on admission 140/73 Home medications include HCTZ 12.5 mg daily, losartan 50 mg daily, patient reports taking medication today.  ?-Continue home meds ?-monitor BP  ? ?Tobacco use disorder  Marijuana abuse ?Patient reports smoking for over 50 years, currently smoking around a pack a day. Patient also endorsees daily THC use.  ?-Nicotine patch 14 mg, daily ?-UDS ?-Encourage cessation  ? 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 Per chart review, patient has limited residual effects from stroke, primarily lower extremity weakness and balance difficulty. Patient uses cane/walker/wheelchair at home, as needed. Home medications include Plavix 75 mg daily, ASA 81 mg daily, and Lipitor 80 mg daily. Last lipid panel from 08/01/21 ?-Continue home meds ? ? ?FEN/GI: NPO pending bedside swallow study  ?Prophylaxis: Lovenox ? ?Disposition: Admit to med tele ? ?History of Present Illness:  Craig Holden is a 66 y.o. male presenting with seizure-like activity. Patient is unsure of why he is here. No family at bedside. He last recalls being home, he does not remember EMS arriving at his house or arriving to the ED. Now he  says he feels good other than he tongue hurting. Denies headache, nausea, vomiting, vision changes and fever. He feels like he is back to his normal self. Denies alcohol use, last drink was a  few months ago. Admits to marijuana use almost daily. Tobacco user since 66 years old, smokes about 1 ppd. He says he did not take his Depakote and has not taken it for the last 3 months since he has so many medications so he does not want to confuse it with his other medications. Uses a cane, walker and wheelchair to get around at home. Wheelchair is only used at home. Able to perform all his ADLs.  ? ?Review Of Systems: Per HPI with the following additions:  ? ?Review of Systems  ?Constitutional:  Negative for chills and fever.  ?HENT:  Negative for congestion.   ?Eyes:  Negative for visual disturbance.  ?Respiratory:  Negative for shortness of breath.   ?Cardiovascular:  Negative for chest pain.  ?Gastrointestinal:  Negative for nausea and vomiting.  ?Neurological:  Positive for seizures. Negative for dizziness, speech difficulty, weakness, numbness and headaches.   ? ?Patient Active Problem List  ? Diagnosis Date Noted  ? Seizure-like activity (HCC) 08/31/2021  ? Chronic obstructive pulmonary disease (HCC) 08/01/2021  ? Mild recurrent major depression (HCC) 08/01/2021  ? Acute encephalopathy 08/01/2021  ? Encephalopathy acute 08/01/2021  ? Fever   ? Seizures (HCC) 01/21/2021  ? Seizure (HCC) 01/21/2021  ? Occlusion of left internal carotid artery, s/p revascularization 04/21/2015  ? Stroke (cerebrum) (HCC) - L MCA embolic d/t L ICA occlusion   ? HLD (hyperlipidemia)   ? Tobacco use disorder   ? Marijuana abuse   ? Preoperative evaluation to rule out surgical contraindication 01/01/2014  ? Essential hypertension 01/01/2014  ? History of stroke 01/01/2014  ? ? ?Past Medical History: ?Past Medical History:  ?Diagnosis Date  ? CVA (cerebral infarction)   ? Depression   ? Hypertension   ? Migraine   ? Nerve damage   ? PTSD (post-traumatic stress disorder)   ? ? ?Past Surgical History: ?Past Surgical History:  ?Procedure Laterality Date  ? ANTERIOR CERVICAL DECOMPRESSION/DISCECTOMY FUSION 4 LEVELS  01/20/2015  ? C4-C7   ? BACK SURGERY    ? HEMORROIDECTOMY    ? HIP SURGERY    ? RADIOLOGY WITH ANESTHESIA N/A 04/18/2015  ? Procedure: RADIOLOGY WITH ANESTHESIA;  Surgeon: Julieanne CottonSanjeev Deveshwar, MD;  Location: [facility] OR;  Service: Radiology;  Laterality: N/A;  ? ? ?Social History: ?Social History  ? ?Tobacco Use  ? Smoking status: Every Day  ?  Packs/day: 1.00  ?  Types: Cigarettes  ? Smokeless tobacco: Never  ?Vaping Use  ? Vaping Use: Never used  ?Substance Use Topics  ? Alcohol use: No  ? Drug use: Yes  ?  Types: Marijuana  ?  Comment: denies  ? ?Additional social history:   ?Please also refer to relevant sections of EMR. ? ?Family History: ?Family History  ?Problem Relation Age of Onset  ? Heart attack Father   ? Emphysema Father   ? Hypertension Mother   ? Diabetes Mother   ? ?Allergies and Medications: ?Allergies  ?Allergen Reactions  ? Morphine Sulfate Nausea And Vomiting  ? Sertraline Nausea And Vomiting  ? ?No current facility-administered medications on file prior to encounter.  ? ?Current Outpatient Medications on File Prior to Encounter  ?Medication Sig Dispense Refill  ? acetaminophen (TYLENOL) 325 MG tablet Take 650 mg by mouth 3 (three) times daily  as needed for headache.    ? albuterol (PROVENTIL HFA;VENTOLIN HFA) 108 (90 BASE) MCG/ACT inhaler Inhale 1-2 puffs into the lungs every 6 (six) hours as needed for wheezing. 1 Inhaler 0  ? aspirin EC 81 MG tablet Take 81 mg by mouth daily. Swallow whole.    ? atorvastatin (LIPITOR) 80 MG tablet Take 1 tablet (80 mg total) by mouth at bedtime. 30 tablet 0  ? divalproex (DEPAKOTE ER) 500 MG 24 hr tablet Take 2 tablets (1,000 mg total) by mouth daily. 30 tablet 0  ? fluticasone (FLONASE) 50 MCG/ACT nasal spray Place 2 sprays into both nostrils daily as needed (nasal congestion).    ? hydrochlorothiazide (HYDRODIURIL) 25 MG tablet Take 12.5 mg by mouth daily.     ? losartan (COZAAR) 100 MG tablet Take 50 mg by mouth daily.    ? Multiple Vitamin (MULTIVITAMIN) tablet Take 1 tablet by mouth  daily.    ? nicotine (NICODERM CQ - DOSED IN MG/24 HOURS) 21 mg/24hr patch Place 1 patch (21 mg total) onto the skin daily. (Patient not taking: Reported on 08/02/2021) 28 patch 0  ? nicotine polacrilex (CO

## 2021-08-31 NOTE — ED Provider Notes (Signed)
?[facility] ?Provider Note ? ? ?CSN: [id number] ?Arrival date & time: 08/31/21  1725 ? ?  ? ?History ? ?Chief Complaint  ?Patient presents with  ? Seizures  ? ? ?Craig Holden is a 66 y.o. male. ? ?66 year old male with prior medical history as detailed below presents for evaluation.  Patient with history of seizure.  Patient with reportedly 2 back-to-back grand mal type seizures at home today.  Family called EMS after the second seizure episode.  Patient was seen by EMS and during transport patient had another reported seizure-like episode.  5 mg of midazolam was given by EMS prior to arrival to the ED. ? ?On arrival to the ED the patient is somnolent and postictal. ? ?He does have a small laceration to the tip of the tongue. ? ?Patient's spouse is at bedside.  She cannot confirm that he is compliant with previously prescribed antiepileptic medications. ? ? ?The history is provided by the patient, the spouse and medical records.  ?Seizures ?Seizure activity on arrival: no   ?Seizure type:  Grand mal ?Initial focality:  Unable to specify ?Episode characteristics: abnormal movements   ?Postictal symptoms: confusion, memory loss and somnolence   ?Return to baseline: no   ?Severity:  Moderate ? ?  ? ?Home Medications ?Prior to Admission medications   ?Medication Sig Start Date End Date Taking? Authorizing Provider  ?acetaminophen (TYLENOL) 325 MG tablet Take 650 mg by mouth 3 (three) times daily as needed for headache.    [provider]  ?albuterol (PROVENTIL HFA;VENTOLIN HFA) 108 (90 BASE) MCG/ACT inhaler Inhale 1-2 puffs into the lungs every 6 (six) hours as needed for wheezing. 06/12/11   Palumbo, April, MD  ?aspirin EC 81 MG tablet Take 81 mg by mouth daily. Swallow whole.    [provider]  ?atorvastatin (LIPITOR) 80 MG tablet Take 1 tablet (80 mg total) by mouth at bedtime. 08/03/21   Cora CollumPaige, Victoria J, DO  ?divalproex (DEPAKOTE ER) 500 MG 24 hr tablet Take  2 tablets (1,000 mg total) by mouth daily. 08/03/21   Cora CollumPaige, Victoria J, DO  ?fluticasone (FLONASE) 50 MCG/ACT nasal spray Place 2 sprays into both nostrils daily as needed (nasal congestion).    [provider]  ?hydrochlorothiazide (HYDRODIURIL) 25 MG tablet Take 12.5 mg by mouth daily.     [provider]  ?losartan (COZAAR) 100 MG tablet Take 50 mg by mouth daily.    [provider]  ?Multiple Vitamin (MULTIVITAMIN) tablet Take 1 tablet by mouth daily.    [provider]  ?nicotine (NICODERM CQ - DOSED IN MG/24 HOURS) 21 mg/24hr patch Place 1 patch (21 mg total) onto the skin daily. ?Patient not taking: Reported on 08/02/2021 01/24/21   Elgergawy, Leana Roeawood S, MD  ?nicotine polacrilex (COMMIT) 2 MG lozenge Take 2 mg by mouth as needed for smoking cessation.    [provider]  ?sildenafil (VIAGRA) 100 MG tablet Take 100 mg by mouth daily as needed for erectile dysfunction.    [provider]  ?vitamin B-12 (CYANOCOBALAMIN) 500 MCG tablet Take 500 mcg by mouth daily.    [provider]  ?zonisamide (ZONEGRAN) 100 MG capsule Take 3 capsules (300 mg total) by mouth at bedtime. 08/03/21   Cora CollumPaige, Victoria J, DO  ?   ? ?Allergies    ?Morphine sulfate and Sertraline   ? ?Review of Systems   ?Review of Systems  ?Neurological:  Positive for seizures.  ?All other systems reviewed and  are negative. ? ?Physical Exam ?Updated Vital Signs ?BP 140/73 (BP Location: Right Arm)   Pulse 90   Temp 98.9 ?F (37.2 ?C) (Oral)   Resp 18   Ht 6' 1.5" (1.867 m)   Wt 81.6 kg   SpO2 98%   BMI 23.41 kg/m?  ?Physical Exam ?Vitals and nursing note reviewed.  ?Constitutional:   ?   General: He is not in acute distress. ?   Appearance: He is well-developed.  ?   Comments: Somnolent, appears postictal, status post Versed administration by EMS  ?HENT:  ?   Head: Normocephalic and atraumatic.  ?   Mouth/Throat:  ?   Comments: Superficial laceration to tip of tongue secondary to biting  during seizure ?Eyes:  ?   Conjunctiva/sclera: Conjunctivae normal.  ?   Pupils: Pupils are equal, round, and reactive to light.  ?Cardiovascular:  ?   Rate and Rhythm: Normal rate and regular rhythm.  ?   Heart sounds: Normal heart sounds.  ?Pulmonary:  ?   Effort: Pulmonary effort is normal. No respiratory distress.  ?   Breath sounds: Normal breath sounds.  ?Abdominal:  ?   General: There is no distension.  ?   Palpations: Abdomen is soft.  ?   Tenderness: There is no abdominal tenderness.  ?Musculoskeletal:     ?   General: No deformity. Normal range of motion.  ?   Cervical back: Normal range of motion and neck supple.  ?Skin: ?   General: Skin is warm and dry.  ?Neurological:  ?   General: No focal deficit present.  ?   Mental Status: He is oriented to person, place, and time.  ? ? ?ED Results / Procedures / Treatments   ?Labs ?(all labs ordered are listed, but only abnormal results are displayed) ?Labs Reviewed  ?VALPROIC ACID LEVEL - Abnormal; Notable for the following components:  ?    Result Value  ? Valproic Acid Lvl <10 (*)   ? All other components within normal limits  ?CBC WITH DIFFERENTIAL/PLATELET  ?CBC WITH DIFFERENTIAL/PLATELET  ?BASIC METABOLIC PANEL  ? ? ?EKG ?EKG Interpretation ? ?Date/Time:  Tuesday August 31 2021 17:29:06 EDT ?Ventricular Rate:  95 ?PR Interval:  174 ?QRS Duration: 76 ?QT Interval:  363 ?QTC Calculation: 457 ?R Axis:   93 ?Text Interpretation: Sinus tachycardia Atrial premature complexes Probable left atrial enlargement Anterior infarct, old Confirmed by Kristine Royal [phone]) on 08/31/2021 6:04:25 PM ? ?Radiology ?CT Head Wo Contrast ? ?Result Date: 08/31/2021 ?CLINICAL DATA:  Altered mental status.  Seizures. EXAM: CT HEAD WITHOUT CONTRAST TECHNIQUE: Contiguous axial images were obtained from the base of the skull through the vertex without intravenous contrast. RADIATION DOSE REDUCTION: This exam was performed according to the departmental dose-optimization program which  includes automated exposure control, adjustment of the mA and/or kV according to patient size and/or use of iterative reconstruction technique. COMPARISON:  August 01, 2021. FINDINGS: Brain: Stable left frontal encephalomalacia consistent with old infarction. No mass effect or midline shift is noted. Ventricular size is within normal limits. There is no evidence of mass lesion, hemorrhage or acute infarction. Vascular: No hyperdense vessel or unexpected calcification. Skull: Normal. Negative for fracture or focal lesion. Sinuses/Orbits: No acute finding. Other: None. IMPRESSION: No acute intracranial abnormality seen. Electronically Signed   By: Lupita Raider M.D.   On: 08/31/2021 18:55  ? ?DG Chest Port 1 View ? ?Result Date: 08/31/2021 ?CLINICAL DATA:  Seizure EXAM: PORTABLE CHEST 1 VIEW COMPARISON:  08/01/2021  FINDINGS: The heart size and mediastinal contours are within normal limits. Both lungs are clear. The visualized skeletal structures are unremarkable. IMPRESSION: No active disease. Electronically Signed   By: Helyn Numbers M.D.   On: 08/31/2021 18:19   ? ?Procedures ?Procedures  ? ? ?Medications Ordered in ED ?Medications  ?levETIRAcetam (KEPPRA) IVPB 1500 mg/ 100 mL premix (0 mg Intravenous Stopped 08/31/21 1958)  ? ? ?ED Course/ Medical Decision Making/ A&P ?  ?                        ?Medical Decision Making ?Amount and/or Complexity of Data Reviewed ?Labs: ordered. ?Radiology: ordered. ? ?Risk ?Prescription drug management. ? ? ? ?Medical Screen Complete ? ?This patient presented to the ED with complaint of recurrent seizure activity. ? ?This complaint involves an extensive number of treatment options. The initial differential diagnosis includes, but is not limited to, current seizure activity, metabolic abnormality, medication noncompliance, etc. ? ?This presentation is: Chronic, Self-Limited, Previously Undiagnosed, Uncertain Prognosis, Complicated, Systemic Symptoms, and Threat to Life/Bodily  Function ? ?Patient with history of seizure. ? ?Patient with uncertain compliance with previously prescribed antiepileptic medications. ? ?Patient with 2 reported episodes of seizure like activity prior to EMS notification. ?

## 2021-08-31 NOTE — ED Triage Notes (Signed)
Pt BIB EMS for seizure activity. Family told EMS that pt had two grand mal seizures today, when they called EMS, each lasting about 1.5-2 mins. When EMS arrived, pt was post-ictal and responding to stimuli. However, pt started seizing once again as soon as EMS entered ED.  ? ?Midazolam 5mg  was given via IM.Oxygen was administered during startup of seizure activity. ? ?VSS w/ EMS.  ?

## 2021-08-31 NOTE — Consult Note (Signed)
Neurology Consultation ? ?Reason for Consult: Seizure ?Referring Physician: Dr. Rodena Medin ? ?CC: Seizure ? ?History is obtained from: Chart ? ?HPI: Craig Holden is a 66 y.o. 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  Received 5 mg of midazolam by EMS.  Loaded with Keppra in the emergency room. ?He is now coming around but it took him a while to come around.  Due to the prolonged postictal state and multiplicity of seizures, he was admitted to the family medicine service. ?Patient appears oriented to person place and time at this time.  He reports that he has way too many pills to take and he is not taking any antiepileptic medications. ?In the past he has been prescribed Keppra, zonisamide, valproate and Dilantin but he has been noncompliant to any of those. ?Current valproate levels undetectable ? ?He reports that he follows with the [place] [facility] in [place] in [place].  Does not have a neurologist in the [place] system.  Has seen Dr. Teresa Coombs at [facility] neurology as an outpatient once.  Wishes to follow with [facility] ? ? ?ROS: Full ROS was performed and is negative except as noted in the HPI.  ? ?Past Medical History:  ?Diagnosis Date  ? CVA (cerebral infarction)   ? Depression   ? Hypertension   ? Migraine   ? Nerve damage   ? PTSD (post-traumatic stress disorder)   ? ?Family History  ?Problem Relation Age of Onset  ? Heart attack Father   ? Emphysema Father   ? Hypertension Mother   ? Diabetes Mother   ? ?Social History:  ? reports that he has been smoking cigarettes. He has been smoking an average of 1 pack per day. He has never used smokeless tobacco. He reports current drug use. Drug: Marijuana. He reports that he does not drink  alcohol. ? ?Medications ?No current facility-administered medications for this encounter. ? ?Current Outpatient Medications:  ?  acetaminophen (TYLENOL) 325 MG tablet, Take 650 mg by mouth 3 (three) times daily as needed for headache., Disp: , Rfl:  ?  albuterol (PROVENTIL HFA;VENTOLIN HFA) 108 (90 BASE) MCG/ACT inhaler, Inhale 1-2 puffs into the lungs every 6 (six) hours as needed for wheezing., Disp: 1 Inhaler, Rfl: 0 ?  aspirin EC 81 MG tablet, Take 81 mg by mouth daily. Swallow whole., Disp: , Rfl:  ?  atorvastatin (LIPITOR) 80 MG tablet, Take 1 tablet (80 mg total) by mouth at bedtime., Disp: 30 tablet, Rfl: 0 ?  divalproex (DEPAKOTE ER) 500 MG 24 hr tablet, Take 2 tablets (1,000 mg total) by mouth daily., Disp: 30 tablet, Rfl: 0 ?  fluticasone (FLONASE) 50 MCG/ACT nasal spray, Place 2 sprays into both nostrils daily as needed (nasal congestion)., Disp: , Rfl:  ?  hydrochlorothiazide (HYDRODIURIL) 25 MG tablet, Take 12.5 mg by mouth daily. , Disp: , Rfl:  ?  losartan (COZAAR) 100 MG tablet, Take 50 mg by mouth daily., Disp: , Rfl:  ?  Multiple Vitamin (MULTIVITAMIN) tablet, Take 1 tablet by mouth daily., Disp: , Rfl:  ?  nicotine (NICODERM CQ - DOSED IN MG/24 HOURS) 21 mg/24hr patch, Place 1 patch (21 mg total) onto the skin daily. (Patient not taking: Reported on 08/02/2021), Disp: 28 patch, Rfl: 0 ?  nicotine polacrilex (COMMIT) 2  MG lozenge, Take 2 mg by mouth as needed for smoking cessation., Disp: , Rfl:  ?  sildenafil (VIAGRA) 100 MG tablet, Take 100 mg by mouth daily as needed for erectile dysfunction., Disp: , Rfl:  ?  vitamin B-12 (CYANOCOBALAMIN) 500 MCG tablet, Take 500 mcg by mouth daily., Disp: , Rfl:  ?  zonisamide (ZONEGRAN) 100 MG capsule, Take 3 capsules (300 mg total) by mouth at bedtime., Disp: 90 capsule, Rfl: 0 ? ? ?Exam: ?Current vital signs: ?BP 140/73 (BP Location: Right Arm)   Pulse 90   Temp 98.9 ?F (37.2 ?C) (Oral)   Resp 18   Ht 6' 1.5" (1.867 m)   Wt 81.6 kg   SpO2 98%   BMI  23.41 kg/m?  ?Vital signs in last 24 hours: ?Temp:  [98.9 ?F (37.2 ?C)] 98.9 ?F (37.2 ?C) (04/04 1734) ?Pulse Rate:  [75-94] 90 (04/04 2100) ?Resp:  [17-23] 18 (04/04 2100) ?BP: (95-165)/(71-114) 140/73 (04/04 2100) ?SpO2:  [75 %-98 %] 98 % (04/04 2100) ?Weight:  [81.6 kg] 81.6 kg (04/04 1738) ?General: Awake alert in no distress ?HEENT: Normocephalic atraumatic, tongue bite noted ?Lungs: Clear ?Cardiovascular: Regular rhythm ?Abdomen nondistended nontender ?Extremities warm well perfused, no edema ?Neurological exam ?Awake alert oriented x3 ?No dysarthria ?No aphasia ?Cranial nerves II to XII intact ?Motor examination with no drift in any of the 4 extremities ?Sensation intact to light touch all over ?No evidence of dysmetria ? ?Labs ?I have reviewed labs in epic and the results pertinent to this consultation are: ? ?CBC ?   ?Component Value Date/Time  ? WBC 9.0 08/31/2021 2031  ? RBC 4.82 08/31/2021 2031  ? HGB 15.8 08/31/2021 2031  ? HCT 46.8 08/31/2021 2031  ? PLT 156 08/31/2021 2031  ? MCV 97.1 08/31/2021 2031  ? MCH 32.8 08/31/2021 2031  ? MCHC 33.8 08/31/2021 2031  ? RDW 14.3 08/31/2021 2031  ? LYMPHSABS 1.7 08/31/2021 2031  ? MONOABS 1.0 08/31/2021 2031  ? EOSABS 0.0 08/31/2021 2031  ? BASOSABS 0.0 08/31/2021 2031  ? ? ?CMP  ?   ?Component Value Date/Time  ? NA 138 08/31/2021 2149  ? K 4.1 08/31/2021 2149  ? CL 105 08/31/2021 2149  ? CO2 23 08/31/2021 2149  ? GLUCOSE 89 08/31/2021 2149  ? BUN 7 (L) 08/31/2021 2149  ? CREATININE 1.12 08/31/2021 2149  ? CALCIUM 9.1 08/31/2021 2149  ? PROT 7.1 08/01/2021 1419  ? ALBUMIN 3.9 08/01/2021 1419  ? AST 27 08/01/2021 1419  ? ALT 23 08/01/2021 1419  ? ALKPHOS 75 08/01/2021 1419  ? BILITOT 0.7 08/01/2021 1419  ? GFRNONAA >60 08/31/2021 2149  ? GFRAA >60 11/16/2017 1750  ? ? ?Lipid Panel  ?   ?Component Value Date/Time  ? CHOL 163 08/02/2021 0346  ? TRIG 79 08/02/2021 0346  ? HDL 37 (L) 08/02/2021 0346  ? CHOLHDL 4.4 08/02/2021 0346  ? VLDL 16 08/02/2021 0346  ? LDLCALC  110 (H) 08/02/2021 0346  ? ? ? ?Imaging ?I have reviewed the images obtained: ? ?CT-head with no acute intracranial abnormality ? ? ?Assessment:  ?66 year old with multiple medical comorbidities along with seizure disorder who is noncompliant medications coming in with breakthrough seizures. ?He reports noncompliance to medications because he does not like taking multiple medications. ?Given his history of PTSD valproate would have been a good choice but if he is noncompliant and cannot get levels checked consistently, it would not be a great medication. ?I have discussed with him trial of Keppra again. ?He  is received loading dose of Keppra in the emergency room ?Recommendations as below ? ?Impression: ?Breakthrough seizure in the setting of noncompliance ? ?Recommendations: ?Start Keppra 500 twice daily ?If he can remain compliant with this, his medication regimen could be simplified by using Keppra 1000 mg extended release so that he only has to take the medication once a day. ?I would not do this just about yet-I would let these changes be made once he establishes care with outpatient neurology consistently. ?Seizure precautions as below ?Continue to maintain inpatient seizure precautions as well ?I stressed the importance of medication compliance which he seems to verbalize understanding of.  I would recommend that the primary team reiterate this with his wife/family members, once they are here. ?I do not see a need for doing an EEG-would not change management. ?Inpatient neurology will be available as needed-please call with questions. ? ?Plan discussed with Dr. Rodena Medin ? ? ?SEIZURE PRECAUTIONS ?Per [facility] statutes, patients with seizures are not allowed to drive until they have been seizure-free for six months.  ? ?Use caution when using heavy equipment or power tools. Avoid working on ladders or at heights. Take showers instead of baths. Ensure the water temperature is not too high on the home  water heater. Do not go swimming alone. Do not lock yourself in a room alone (i.e. bathroom). When caring for infants or small children, sit down when holding, feeding, or changing them to minimize risk of injury t

## 2021-09-01 ENCOUNTER — Encounter (HOSPITAL_COMMUNITY): Payer: Self-pay | Admitting: Family Medicine

## 2021-09-01 ENCOUNTER — Other Ambulatory Visit: Payer: Self-pay

## 2021-09-01 ENCOUNTER — Other Ambulatory Visit (HOSPITAL_COMMUNITY): Payer: Self-pay

## 2021-09-01 DIAGNOSIS — R569 Unspecified convulsions: Secondary | ICD-10-CM | POA: Diagnosis not present

## 2021-09-01 LAB — URINALYSIS, ROUTINE W REFLEX MICROSCOPIC
Bilirubin Urine: NEGATIVE
Glucose, UA: NEGATIVE mg/dL
Hgb urine dipstick: NEGATIVE
Ketones, ur: NEGATIVE mg/dL
Leukocytes,Ua: NEGATIVE
Nitrite: NEGATIVE
Protein, ur: NEGATIVE mg/dL
Specific Gravity, Urine: 1.018 (ref 1.005–1.030)
pH: 7 (ref 5.0–8.0)

## 2021-09-01 LAB — CBC
HCT: 41.5 % (ref 39.0–52.0)
Hemoglobin: 14.4 g/dL (ref 13.0–17.0)
MCH: 33 pg (ref 26.0–34.0)
MCHC: 34.7 g/dL (ref 30.0–36.0)
MCV: 95.2 fL (ref 80.0–100.0)
Platelets: 160 10*3/uL (ref 150–400)
RBC: 4.36 MIL/uL (ref 4.22–5.81)
RDW: 14.2 % (ref 11.5–15.5)
WBC: 9.3 10*3/uL (ref 4.0–10.5)
nRBC: 0 % (ref 0.0–0.2)

## 2021-09-01 LAB — BASIC METABOLIC PANEL
Anion gap: 8 (ref 5–15)
BUN: 9 mg/dL (ref 8–23)
CO2: 24 mmol/L (ref 22–32)
Calcium: 8.9 mg/dL (ref 8.9–10.3)
Chloride: 105 mmol/L (ref 98–111)
Creatinine, Ser: 1.03 mg/dL (ref 0.61–1.24)
GFR, Estimated: >60 mL/min (ref >60)
Glucose, Bld: 110 mg/dL — ABNORMAL HIGH (ref 70–99)
Potassium: 3.4 mmol/L — ABNORMAL LOW (ref 3.5–5.1)
Sodium: 137 mmol/L (ref 135–145)

## 2021-09-01 LAB — RAPID URINE DRUG SCREEN, HOSP PERFORMED
Amphetamines: NOT DETECTED
Barbiturates: NOT DETECTED
Benzodiazepines: POSITIVE — AB
Cocaine: NOT DETECTED
Opiates: NOT DETECTED
Tetrahydrocannabinol: POSITIVE — AB

## 2021-09-01 LAB — MAGNESIUM: Magnesium: 1.7 mg/dL (ref 1.7–2.4)

## 2021-09-01 MED ORDER — POTASSIUM CHLORIDE 20 MEQ PO PACK
40.0000 meq | PACK | Freq: Once | ORAL | Status: AC
  Administered 2021-09-01: 40 meq via ORAL
  Filled 2021-09-01: qty 2

## 2021-09-01 MED ORDER — LEVETIRACETAM ER 500 MG PO TB24
1000.0000 mg | ORAL_TABLET | Freq: Once | ORAL | Status: AC
  Administered 2021-09-01: 1000 mg via ORAL
  Filled 2021-09-01: qty 2

## 2021-09-01 MED ORDER — LORAZEPAM 2 MG/ML IJ SOLN: 4.0000 mg | INTRAMUSCULAR | Status: DC | PRN

## 2021-09-01 MED ORDER — LEVETIRACETAM ER 500 MG PO TB24
1000.0000 mg | ORAL_TABLET | Freq: Every day | ORAL | 0 refills | Status: DC
  Filled 2021-09-01: qty 60, 30d supply, fill #0
  Filled 2021-09-01: qty 30, 30d supply, fill #0

## 2021-09-01 MED ORDER — LEVETIRACETAM ER 1000 MG PO TB24
1000.0000 mg | ORAL_TABLET | Freq: Every day | ORAL | 0 refills | Status: DC
Start: 2021-09-01 — End: 2021-09-01
  Filled 2021-09-01: qty 30, fill #0

## 2021-09-01 MED ORDER — LEVETIRACETAM ER 1000 MG PO TB24: 1000.0000 mg | ORAL_TABLET | Freq: Every day | ORAL | 0 refills | Status: DC

## 2021-09-01 NOTE — Evaluation (Signed)
Physical Therapy Evaluation ?Patient Details ?Name: Craig Holden ?MRN: [id number] ?DOB: Oct 18, 1955 ?Today's Date: 09/01/2021 ? ?History of Present Illness ? Pt. is a 66 y.o. M [facility] after 3 witnessed tonic-clonic seizures, 2 at home and 1 with EMS. He has a history of not taking his seizure medications for the past few months. PMH significant for seizure disorder, CVA, HTN, PTSD, and tobacco use. Recently admitted 3/5-7 for AMS.  ?Clinical Impression ? Pt presents with decreased functional mobility, balance, strength, coordination, and gait ability. These impairments are limiting his ability to safely and independently transfer, get into his home, perform all adls/iadls, and ambulate in the community. Pt to benefit from acute PT to address deficits. Bed mobility, gait, high level balance challenges, and repeated tranfers were performed and pt ambulated 150 feet with rollator.  Pt. Responded well but was limited secondary to his impulsivity, decreased balance, coordination, and functional endurance. SPT recommends out-patient neuro PT follow up for balance, coordination, and gait training once medically stable for d/c. PT to progress mobility as tolerated, and will continue to follow acutely.  ?   ?   ? ?Recommendations for follow up therapy are one component of a multi-disciplinary discharge planning process, led by the attending physician.  Recommendations may be updated based on patient status, additional functional criteria and insurance authorization. ? ?Follow Up Recommendations Outpatient PT (OP Neuro) ? ?  ?Assistance Recommended at Discharge Intermittent Supervision/Assistance  ?Patient can return home with the following ? A little help with walking and/or transfers;A little help with bathing/dressing/bathroom;Assistance with cooking/housework;Direct supervision/assist for medications management;Assist for transportation;Help with stairs or ramp for entrance ? ?  ?Equipment Recommendations None  recommended by PT  ?Recommendations for Other Services ?    ?  ?Functional Status Assessment Patient has had a recent decline in their functional status and demonstrates the ability to make significant improvements in function in a reasonable and predictable amount of time.  ? ?  ?Precautions / Restrictions Precautions ?Precautions: Fall ?Precaution Comments: Uses Rollator/RW/Cane at baseline, balance impaired from previous surgeries/stroke. ?Restrictions ?Weight Bearing Restrictions: No  ? ?  ? ?Mobility ? Bed Mobility ?Overal bed mobility: Modified Independent ?  ?  ?  ?  ?  ?  ?General bed mobility comments: Able to get to EOB w/o assitance ?Patient Response: Impulsive ? ?Transfers ?Overall transfer level: Needs assistance ?Equipment used: Rollator (4 wheels) ?Transfers: Sit to/from Stand ?Sit to Stand: Supervision ?  ?  ?  ?  ?  ?General transfer comment: Impulsive, one episode of LOB but able to correct. STSx 3 ?  ? ?Ambulation/Gait ?Ambulation/Gait assistance: Min guard ?Gait Distance (Feet): 150 Feet ?Assistive device: Rollator (4 wheels) ?Gait Pattern/deviations: Step-through pattern, Staggering left, Staggering right, Drifts right/left, Decreased stride length ?Gait velocity: WNL ?  ?  ?General Gait Details: Pt. requires UE support for all standing and dynamic movements. R LE with valgus and reccurvatum throughout gait. Pt staggers in multiple directions and reports that "he normally looks like he is going to fall but never does." ? ?Stairs ?  ?  ?  ?  ?  ? ?Wheelchair Mobility ?  ? ?Modified Rankin (Stroke Patients Only) ?  ? ?  ? ?Balance Overall balance assessment: Needs assistance ?Sitting-balance support: Feet supported, No upper extremity supported ?Sitting balance-Leahy Scale: Good ?Sitting balance - Comments: Able to reach to the ground to donn socks ?  ?Standing balance support: Bilateral upper extremity supported, During functional activity ?Standing balance-Leahy Scale: Poor ?Standing balance  comment: requires UE support to maintain standing or dynamic balance. This is baseline. ?  ?  ?  ?  ?  ?  ?High level balance activites: Braiding, Backward walking, Direction changes ?High Level Balance Comments: Pt. tolerated all balance challenges well with no noted LOB ?  ?  ?  ?   ? ? ? ?Pertinent Vitals/Pain Pain Assessment ?Pain Assessment: No/denies pain  ? ? ?Home Living Family/patient expects to be discharged to:: Private residence ?Living Arrangements: Spouse/significant other ?Available Help at Discharge: Family;Available 24 hours/day ?Type of Home: House ?Home Access: Ramped entrance ?  ?  ?Alternate Level Stairs-Number of Steps: flight ?Home Layout: Two level ?Home Equipment: Rollator (4 wheels);Shower seat;Grab bars - tub/shower;Hand held shower head;Electric scooter ?   ?  ?Prior Function Prior Level of Function : Needs assist;Driving ?  ?  ?  ?Physical Assist : ADLs (physical) ?  ?  ?Mobility Comments: uses a rollator for short distance ambulation, electric scooter for community ?ADLs Comments: wife sets up and assists as needed ?  ? ? ?Hand Dominance  ? Dominant Hand: Right ? ?  ?Extremity/Trunk Assessment  ? Upper Extremity Assessment ?Upper Extremity Assessment: Defer to OT evaluation ?  ? ?Lower Extremity Assessment ?Lower Extremity Assessment: Generalized weakness ?LLE Deficits / Details: Weakness from L CVA and previous stroke ?  ? ?Cervical / Trunk Assessment ?Cervical / Trunk Assessment: Normal  ?Communication  ? Communication: No difficulties  ?Cognition Arousal/Alertness: Awake/alert ?Behavior During Therapy: Impulsive ?Overall Cognitive Status: Within Functional Limits for tasks assessed ?  ?  ?  ?  ?  ?  ?  ?  ?  ?  ?  ?  ?  ?  ?  ?  ?General Comments: Poor saftey awareness and insight to deficits, admits he has a lot of close falls. Very impulsive ?  ?  ? ?  ?General Comments General comments (skin integrity, edema, etc.): Pt. has multiple LOB througout session but is able to correct  them and not fall. He reaches out to walls/furniture for support. He reports this is close to his baseline. ? ?  ?Exercises    ? ?Assessment/Plan  ?  ?PT Assessment Patient needs continued PT services  ?PT Problem List Decreased strength;Decreased activity tolerance;Decreased balance;Decreased mobility;Decreased knowledge of use of DME;Decreased coordination;Decreased safety awareness ? ?   ?  ?PT Treatment Interventions DME instruction;Gait training;Stair training;Functional mobility training;Therapeutic activities;Therapeutic exercise;Balance training;Neuromuscular re-education   ? ?PT Goals (Current goals can be found in the Care Plan section)  ?Acute Rehab PT Goals ?Patient Stated Goal: Get home ?PT Goal Formulation: With patient ?Time For Goal Achievement: 09/15/21 ?Potential to Achieve Goals: Good ? ?  ?Frequency Min 3X/week ?  ? ? ?Co-evaluation   ?  ?  ?  ?  ? ? ?  ?AM-PAC PT "6 Clicks" Mobility  ?Outcome Measure Help needed turning from your back to your side while in a flat bed without using bedrails?: None ?Help needed moving from lying on your back to sitting on the side of a flat bed without using bedrails?: None ?Help needed moving to and from a bed to a chair (including a wheelchair)?: A Little ?Help needed standing up from a chair using your arms (e.g., wheelchair or bedside chair)?: A Little ?Help needed to walk in hospital room?: A Little ?Help needed climbing 3-5 steps with a railing? : A Lot ?6 Click Score: 19 ? ?  ?End of Session   ?Activity Tolerance: Patient tolerated treatment well ?  Patient left: in chair;with call bell/phone within reach;with chair alarm set ?Nurse Communication: Mobility status ?PT Visit Diagnosis: Unsteadiness on feet (R26.81);Muscle weakness (generalized) (M62.81);Difficulty in walking, not elsewhere classified (R26.2) ?  ? ?Time: 5621-3086 ?PT Time Calculation (min) (ACUTE ONLY): 19 min ? ? ?Charges:   PT Evaluation ?$PT Eval Low Complexity: 1 Low ?  ?  ?   ? ? ?Lorie Apley, SPT ?Acute Rehab Services ? ? ?Lorie Apley ?09/01/2021, 10:36 AM ? ?

## 2021-09-01 NOTE — Progress Notes (Addendum)
Family Medicine Teaching Service ?Daily Progress Note ?Intern Pager: [phone] ? ?Patient name: Craig Holden Medical record number: [id number] ?Date of birth: Dec 30, 1955 Age: 66 y.o. Gender: male ? ?Primary Care Provider: Fleet Contras, MD ?Consultants: Neurology (s/o) ?Code Status: Full ? ?Pt Overview and Major Events to Date:  ?4/4 admitted ? ?Assessment and Plan: ?Craig Holden is a 66 y.o. male presenting with seizure like activity . PMH is significant for seizures, stroke, COPD, HTN. ? ?Seizure like activity  Mental status changes  Hx of Seizure disorder ?CT showed no acute intracranial hemorrhage, chest x-ray showed no active processes.  Passed bedside swallow.  Resumed diet.  Promised to take his antiseizure medication.  VSS on room air.  No neurological deficits noted.  Magnesium 1.7, CBC WNL, potassium 3.4  s/p 40 meq repletion.  UDS positive for THC and benzodiazepine (given versed enroute to ED).  Patient currently unwilling to take Keppra 500 twice daily but is willing to take Keppra extended release 1000 mg daily.  Given he has already received his morning Keppra, will give him extended release dose to cover him until tomorrow morning. ?-Ativan 4 mg as needed for seizures lasting greater than 5 minutes ?-Status post Keppra loading dose 1500 mg ?-PT recommended outpatient PT ?-Seizure precautions ?-Keppra ER 1000 mg qd ? ?HTN ?Chronic.  Normotensive.  Home med hydrochlorothiazide 12.5 daily, losartan 50 mg daily, Cozaar 100 mg qd ?-Continue home meds ?-Monitor blood pressure ? ?Tobacco use disorder  Marijuana abuse ?Smoking for over 50 years and daily THC use.  Precontemplative at this time. ?-Nicotine patch ?-Encourage cessation ? ?Hx of stroke (2015, 2017 - both at [facility])  HLD ?Still on dual antiplatelet therapy due to left ICA stent ?-Continue home meds Plavix 75 mg daily, aspirin 81 mg daily, and Lipitor 80 mg daily ? ?FEN/GI: Regular ?PPx: Lovenox ?Dispo:Home today. Barriers include ongoing  medical treatment.  ? ?Subjective:  ?Patient seen and assessed at bedside.  Patient denies any acute concerns at this time.  Patient verbalized understanding that he had seizures because he did not take his seizure medications.  Patient denies any affordability problems regarding his antiseizure medications.  Patient reports that he just does not take them due to "taking too many meds".  Patient is agreeable to taking antiseizure medication if it is once in the morning.  He had previously been on Depakote ER 1000 mg once a day.  Patient states that he will take his antiseizure medications to avoid hospitalization.  Patient had no other acute concerns at this time. ? ?Objective: ?Temp:  [98.7 ?F (37.1 ?C)-98.9 ?F (37.2 ?C)] 98.7 ?F (37.1 ?C) (04/05 6720) ?Pulse Rate:  [70-101] 86 (04/05 0635) ?Resp:  [14-23] 18 (04/05 9470) ?BP: (88-165)/(64-114) 120/77 (04/05 9628) ?SpO2:  [75 %-98 %] 98 % (04/05 0635) ?Weight:  [81.6 kg] 81.6 kg (04/04 1738) ?Physical Exam: ?General: Resting comfortably, no acute distress ?Cardiovascular: Regular rate rhythm ?Respiratory: CTA B ?Abdomen: Soft nontender to palpation ?Extremities: Warm dry no pitting edema noted ? ?Laboratory: ?Recent Labs  ?Lab 08/31/21 ?2031 09/01/21 ?0448  ?WBC 9.0 9.3  ?HGB 15.8 14.4  ?HCT 46.8 41.5  ?PLT 156 160  ? ?Recent Labs  ?Lab 08/31/21 ?2149 09/01/21 ?0448  ?NA 138 137  ?K 4.1 3.4*  ?CL 105 105  ?CO2 23 24  ?BUN 7* 9  ?CREATININE 1.12 1.03  ?CALCIUM 9.1 8.9  ?GLUCOSE 89 110*  ? ? ? ? ?Imaging/Diagnostic Tests: ?CT Head Wo Contrast ? ?Result Date: 08/31/2021 ?CLINICAL  DATA:  Altered mental status.  Seizures. EXAM: CT HEAD WITHOUT CONTRAST TECHNIQUE: Contiguous axial images were obtained from the base of the skull through the vertex without intravenous contrast. RADIATION DOSE REDUCTION: This exam was performed according to the departmental dose-optimization program which includes automated exposure control, adjustment of the mA and/or kV according to patient  size and/or use of iterative reconstruction technique. COMPARISON:  August 01, 2021. FINDINGS: Brain: Stable left frontal encephalomalacia consistent with old infarction. No mass effect or midline shift is noted. Ventricular size is within normal limits. There is no evidence of mass lesion, hemorrhage or acute infarction. Vascular: No hyperdense vessel or unexpected calcification. Skull: Normal. Negative for fracture or focal lesion. Sinuses/Orbits: No acute finding. Other: None. IMPRESSION: No acute intracranial abnormality seen. Electronically Signed   By: Marijo Conception M.D.   On: 08/31/2021 18:55  ? ?DG Chest Port 1 View ? ?Result Date: 08/31/2021 ?CLINICAL DATA:  Seizure EXAM: PORTABLE CHEST 1 VIEW COMPARISON:  08/01/2021 FINDINGS: The heart size and mediastinal contours are within normal limits. Both lungs are clear. The visualized skeletal structures are unremarkable. IMPRESSION: No active disease. Electronically Signed   By: Fidela Salisbury M.D.   On: 08/31/2021 18:19   ? ?France Ravens, MD ?09/01/2021, 9:01 AM ?PGY-1, [place] ?[place] Intern pager: [phone], text pages welcome ?

## 2021-09-01 NOTE — ED Notes (Signed)
Attempted to give report to 3W

## 2021-09-01 NOTE — Hospital Course (Signed)
Craig Holden is a 66 y.o.male with a history of COPD, stroke, hypertension, seizures who was admitted to the [facility] Teaching Service at [facility] for seizure-like activity with postictal state. His hospital course is detailed below: ? ?Tonic-clonic seizure in the setting of medication noncompliance ?Seizure Disorder ?Antiepileptic drug noncompliance ?Patient was initially brought in by EMS due to a witnessed tonic-clonic seizure lasting approximately 2 minutes.  Patient was postictal upon arrival to the ED and had received Versed 5 mg on route.  Vital signs stable throughout hospitalization the patient was comfortable on room air.  Seizure appears to be within the context of not taking his antiseizure medication (Depakote).  Neurology was consulted and they recommended switching patient to Keppra due to Depakote needing more careful titration.  Patient was loaded with 1.5 g IV Keppra which was eventually switched to Keppra ER 1000 mg every day.  Patient did not have any further seizures during hospitalization and returned to baseline mental state. ? ?Other chronic conditions were medically managed with home medications and formulary alternatives as necessary (COPD, hypertension, tobacco use disorder, hyperlipidemia) ? ?PCP Follow-up Recommendations:  ?-Please ensure medication compliance with his keppra. ?

## 2021-09-01 NOTE — Plan of Care (Signed)
Pt is alert oriented x 4. Seizure precautions in place. Pt passed [facility] swallow eval. Pt stated he was not taking his seizure medications at home. Pt states hes ready to go home now.  ? ? ?Problem: Education: ?Goal: Knowledge of General Education information will improve ?Description: Including pain rating scale, medication(s)/side effects and non-pharmacologic comfort measures ?Outcome: Progressing ?  ?Problem: Health Behavior/Discharge Planning: ?Goal: Ability to manage health-related needs will improve ?Outcome: Progressing ?  ?Problem: Clinical Measurements: ?Goal: Ability to maintain clinical measurements within normal limits will improve ?Outcome: Progressing ?Goal: Will remain free from infection ?Outcome: Progressing ?Goal: Diagnostic test results will improve ?Outcome: Progressing ?Goal: Respiratory complications will improve ?Outcome: Progressing ?Goal: Cardiovascular complication will be avoided ?Outcome: Progressing ?  ?Problem: Activity: ?Goal: Risk for activity intolerance will decrease ?Outcome: Progressing ?  ?Problem: Nutrition: ?Goal: Adequate nutrition will be maintained ?Outcome: Progressing ?  ?Problem: Coping: ?Goal: Level of anxiety will decrease ?Outcome: Progressing ?  ?Problem: Elimination: ?Goal: Will not experience complications related to bowel motility ?Outcome: Progressing ?Goal: Will not experience complications related to urinary retention ?Outcome: Progressing ?  ?Problem: Pain Managment: ?Goal: General experience of comfort will improve ?Outcome: Progressing ?  ?Problem: Safety: ?Goal: Ability to remain free from injury will improve ?Outcome: Progressing ?  ?Problem: Skin Integrity: ?Goal: Risk for impaired skin integrity will decrease ?Outcome: Progressing ?  ?Problem: Education: ?Goal: Expressions of having a comfortable level of knowledge regarding the disease process will increase ?Outcome: Progressing ?  ?Problem: Coping: ?Goal: Ability to adjust to condition or change in  health will improve ?Outcome: Progressing ?Goal: Ability to identify appropriate support needs will improve ?Outcome: Progressing ?  ?Problem: Health Behavior/Discharge Planning: ?Goal: Compliance with prescribed medication regimen will improve ?Outcome: Progressing ?  ?Problem: Medication: ?Goal: Risk for medication side effects will decrease ?Outcome: Progressing ?  ?Problem: Clinical Measurements: ?Goal: Complications related to the disease process, condition or treatment will be avoided or minimized ?Outcome: Progressing ?Goal: Diagnostic test results will improve ?Outcome: Progressing ?  ?Problem: Safety: ?Goal: Verbalization of understanding the information provided will improve ?Outcome: Progressing ?  ?Problem: Self-Concept: ?Goal: Level of anxiety will decrease ?Outcome: Progressing ?Goal: Ability to verbalize feelings about condition will improve ?Outcome: Progressing ?  ?

## 2021-09-01 NOTE — Evaluation (Signed)
Occupational Therapy Evaluation ?Patient Details ?Name: Craig Holden ?MRN: [id number] ?DOB: [birth year] ?Today's Date: 09/01/2021 ? ? ?History of Present Illness Pt. is a 66 y.o. M presenting to [facility] after 3 witnessed tonic-clonic seizures, 2 at home and 1 with EMS. He has a history of not taking his seizure medications for the past few months. PMH significant for seizure disorder, CVA, HTN, PTSD, and tobacco use. Recently admitted 3/5-7 for AMS.  ? ?Clinical Impression ?  ?Pt admitted for concerns listed above. PTA pt reported that he was fairly independent with all ADL's and mobility, and when he needed assist his wife was able to help. At this time, pt presents at/near his functional baseline. He is able to complete BADL's mainly in sitting, and mod I functional mobility with a RW. Pt has no further OT needs and acute OT will sign off.   ?   ? ?Recommendations for follow up therapy are one component of a multi-disciplinary discharge planning process, led by the attending physician.  Recommendations may be updated based on patient status, additional functional criteria and insurance authorization.  ? ?Follow Up Recommendations ? No OT follow up  ?  ?Assistance Recommended at Discharge None  ?Patient can return home with the following   ? ?  ?Functional Status Assessment ? Patient has had a recent decline in their functional status and demonstrates the ability to make significant improvements in function in a reasonable and predictable amount of time.  ?Equipment Recommendations ? None recommended by OT  ?  ?Recommendations for Other Services   ? ? ?  ?Precautions / Restrictions Precautions ?Precautions: Fall ?Precaution Comments: Uses Rollator/RW/Cane at baseline, balance impaired from previous surgeries/stroke. ?Restrictions ?Weight Bearing Restrictions: No  ? ?  ? ?Mobility Bed Mobility ?Overal bed mobility: Modified Independent ?  ?  ?  ?  ?  ?  ?General bed mobility comments: Able to get to EOB w/o assitance ?   ? ?Transfers ?Overall transfer level: Modified independent ?Equipment used: Rolling walker (2 wheels) ?  ?  ?  ?  ?  ?  ?  ?General transfer comment: Impulsive, one episode of LOB but able to correct. ?  ? ?  ?Balance Overall balance assessment: Needs assistance ?Sitting-balance support: Feet supported, No upper extremity supported ?Sitting balance-Leahy Scale: Good ?Sitting balance - Comments: Able to reach to the ground to donn socks ?  ?Standing balance support: Bilateral upper extremity supported, During functional activity ?Standing balance-Leahy Scale: Poor ?Standing balance comment: requires UE support to maintain standing or dynamic balance. This is baseline. ?  ?  ?  ?  ?  ?  ?  ?  ?  ?  ?  ?   ? ?ADL either performed or assessed with clinical judgement  ? ?ADL Overall ADL's : Modified independent;At baseline ?  ?  ?  ?  ?  ?  ?  ?  ?  ?  ?  ?  ?  ?  ?  ?  ?  ?  ?  ?General ADL Comments: mod I for ADLs with come concerns for fall/safety. Pt states he is at his functional baseline. Wife assists as needed.  ? ? ? ?Vision Baseline Vision/History: 0 No visual deficits ?Ability to See in Adequate Light: 0 Adequate ?Patient Visual Report: No change from baseline ?Vision Assessment?: No apparent visual deficits  ?   ?Perception   ?  ?Praxis   ?  ? ?Pertinent Vitals/Pain Pain Assessment ?Pain Assessment: No/denies pain  ? ? ? ?  Hand Dominance Right ?  ?Extremity/Trunk Assessment Upper Extremity Assessment ?Upper Extremity Assessment: Overall WFL for tasks assessed (R mildly weaker than L) ?  ?Lower Extremity Assessment ?Lower Extremity Assessment: Defer to PT evaluation ?  ?Cervical / Trunk Assessment ?Cervical / Trunk Assessment: Normal ?  ?Communication Communication ?Communication: No difficulties ?  ?Cognition Arousal/Alertness: Awake/alert ?Behavior During Therapy: Impulsive ?Overall Cognitive Status: Within Functional Limits for tasks assessed ?  ?  ?  ?  ?  ?  ?  ?  ?  ?  ?  ?  ?  ?  ?  ?  ?General Comments:  Poor saftey awareness and insight to deficits, admits he has a lot of close falls. Very impulsive ?  ?  ?General Comments  VSS on RA ? ?  ?Exercises   ?  ?Shoulder Instructions    ? ? ?Home Living Family/patient expects to be discharged to:: Private residence ?Living Arrangements: Spouse/significant other ?Available Help at Discharge: Family;Available 24 hours/day ?Type of Home: House ?Home Access: Ramped entrance ?  ?  ?Home Layout: Two level ?Alternate Level Stairs-Number of Steps: flight ?Alternate Level Stairs-Rails: Can reach both ?Bathroom Shower/Tub: Walk-in shower ?  ?Bathroom Toilet: Standard ?Bathroom Accessibility: Yes ?How Accessible: Accessible via walker ?Home Equipment: Rollator (4 wheels);Shower seat;Grab bars - tub/shower;Hand held shower head;Electric scooter ?  ?  ?  ? ?  ?Prior Functioning/Environment Prior Level of Function : Needs assist;Driving ?  ?  ?  ?  ?  ?  ?Mobility Comments: uses a rollator for short distance ambulation, electric scooter for community ?ADLs Comments: wife sets up and assists as needed ?  ? ?  ?  ?OT Problem List: Decreased strength;Decreased range of motion;Decreased activity tolerance;Impaired balance (sitting and/or standing);Decreased safety awareness;Decreased knowledge of use of DME or AE;Decreased knowledge of precautions ?  ?   ?OT Treatment/Interventions:    ?  ?OT Goals(Current goals can be found in the care plan section) Acute Rehab OT Goals ?Patient Stated Goal: To go home ?OT Goal Formulation: With patient ?Time For Goal Achievement: 09/01/21 ?Potential to Achieve Goals: Good  ?OT Frequency:   ?  ? ?Co-evaluation   ?  ?  ?  ?  ? ?  ?AM-PAC OT "6 Clicks" Daily Activity     ?Outcome Measure Help from another person eating meals?: None ?Help from another person taking care of personal grooming?: None ?Help from another person toileting, which includes using toliet, bedpan, or urinal?: None ?Help from another person bathing (including washing, rinsing, drying)?:  None ?Help from another person to put on and taking off regular upper body clothing?: None ?Help from another person to put on and taking off regular lower body clothing?: None ?6 Click Score: 24 ?  ?End of Session Equipment Utilized During Treatment: Rolling walker (2 wheels);Gait belt ?Nurse Communication: Mobility status ? ?Activity Tolerance: Patient tolerated treatment well ?Patient left: in bed;with call bell/phone within reach ? ?OT Visit Diagnosis: Unsteadiness on feet (R26.81);Other abnormalities of gait and mobility (R26.89);Muscle weakness (generalized) (M62.81)  ?              ?Time: 1218-1228 ?OT Time Calculation (min): 10 min ?Charges:  OT General Charges ?$OT Visit: 1 Visit ?OT Evaluation ?$OT Eval Moderate Complexity: 1 Mod ? ?Trudy Kory H., OTR/L ?Acute Rehabilitation ? ?Anastasio Wogan Elane Bing Plume ?09/01/2021, 3:55 PM ?

## 2021-09-01 NOTE — Discharge Summary (Signed)
Family Medicine Teaching Service ?Hospital Discharge Summary ? ?Patient name: Craig Holden Medical record number: [id number] ?Date of birth: Oct 19, 1955 Age: 66 y.o. Gender: male ?Date of Admission: 08/31/2021  Date of Discharge: 09/01/21 ?Admitting Physician: Nestor Ramp, MD ? ?Primary Care Provider: Fleet Contras, MD ?Consultants: Neurology  ? ?Indication for Hospitalization: Tonic-clonic seizure ? ?Discharge Diagnoses/Problem List:  ?Seizure disorder ?Medication noncompliance ?COPD ?Hypertension ?Marijuana dependence ?Tobacco use disorder ?Hyperlipidemia ?History of CVA ? ?Disposition: home ? ?Discharge Condition: stable ? ?Discharge Exam:  ?General: Resting comfortably, no acute distress ?Cardiovascular: Regular rate rhythm ?Respiratory: CTA B ?Abdomen: Soft nontender to palpation ?Extremities: Warm dry no pitting edema noted ? ?Brief Hospital Course:  ?Craig Holden is a 66 y.o.male with a history of COPD, stroke, hypertension, seizures who was admitted to the [facility] Teaching Service at [facility] for seizure-like activity with postictal state. His hospital course is detailed below: ? ?Tonic-clonic seizure in the setting of medication noncompliance ?Seizure Disorder ?Antiepileptic drug noncompliance ?Patient was initially brought in by EMS due to a witnessed tonic-clonic seizure lasting approximately 2 minutes.  Patient was postictal upon arrival to the ED and had received Versed 5 mg on route.  Vital signs stable throughout hospitalization the patient was comfortable on room air.  Seizure appears to be within the context of not taking his antiseizure medication (Depakote).  Neurology was consulted and they recommended switching patient to Keppra due to Depakote needing more careful titration.  Patient was loaded with 1.5 g IV Keppra which was eventually switched to Keppra ER 1000 mg every day.  Patient did not have any further seizures during hospitalization and returned to baseline mental state. ? ?Other  chronic conditions were medically managed with home medications and formulary alternatives as necessary (COPD, hypertension, tobacco use disorder, hyperlipidemia) ? ?PCP Follow-up Recommendations:  ?-Please ensure medication compliance with his keppra. ? ? ? ?Significant Procedures: none ? ?Significant Labs and Imaging:  ?Recent Labs  ?Lab 08/31/21 ?2031 09/01/21 ?0448  ?WBC 9.0 9.3  ?HGB 15.8 14.4  ?HCT 46.8 41.5  ?PLT 156 160  ? ?Recent Labs  ?Lab 08/31/21 ?2149 09/01/21 ?0448  ?NA 138 137  ?K 4.1 3.4*  ?CL 105 105  ?CO2 23 24  ?GLUCOSE 89 110*  ?BUN 7* 9  ?CREATININE 1.12 1.03  ?CALCIUM 9.1 8.9  ?MG  --  1.7  ? ? ? ? ?Results/Tests Pending at Time of Discharge: none ? ?Discharge Medications:  ?Allergies as of 09/01/2021   ? ?   Reactions  ? Morphine Sulfate Nausea And Vomiting  ? Sertraline Nausea And Vomiting  ? ?  ? ?  ?Medication List  ?  ? ?STOP taking these medications   ? ?divalproex 500 MG 24 hr tablet ?Commonly known as: DEPAKOTE ER ?  ?zonisamide 100 MG capsule ?Commonly known as: ZONEGRAN ?  ? ?  ? ?TAKE these medications   ? ?acetaminophen 325 MG tablet ?Commonly known as: TYLENOL ?Take 650 mg by mouth 3 (three) times daily as needed for headache. ?  ?albuterol 108 (90 Base) MCG/ACT inhaler ?Commonly known as: VENTOLIN HFA ?Inhale 1-2 puffs into the lungs every 6 (six) hours as needed for wheezing. ?  ?aspirin EC 81 MG tablet ?Take 81 mg by mouth daily. Swallow whole. ?  ?atorvastatin 80 MG tablet ?Commonly known as: LIPITOR ?Take 1 tablet (80 mg total) by mouth at bedtime. ?  ?clopidogrel 75 MG tablet ?Commonly known as: PLAVIX ?Take 75 mg by mouth daily. ?  ?fluticasone 50  MCG/ACT nasal spray ?Commonly known as: FLONASE ?Place 2 sprays into both nostrils daily as needed (nasal congestion). ?  ?hydrochlorothiazide 25 MG tablet ?Commonly known as: HYDRODIURIL ?Take 12.5 mg by mouth daily. ?  ?levETIRAcetam ER 1000 MG Tb24 ?Take 1,000 mg by mouth daily. ?  ?levETIRAcetam ER 1000 MG Tb24 ?Take 1,000 mg by  mouth daily. ?  ?losartan 100 MG tablet ?Commonly known as: COZAAR ?Take 50 mg by mouth daily. ?  ?multivitamin tablet ?Take 1 tablet by mouth daily. ?  ?nicotine 21 mg/24hr patch ?Commonly known as: NICODERM CQ - dosed in mg/24 hours ?Place 1 patch (21 mg total) onto the skin daily. ?  ?sildenafil 100 MG tablet ?Commonly known as: VIAGRA ?Take 100 mg by mouth daily as needed for erectile dysfunction. ?  ?vitamin B-12 500 MCG tablet ?Commonly known as: CYANOCOBALAMIN ?Take 500 mcg by mouth daily. ?  ? ?  ? ? ?Discharge Instructions: Please refer to Patient Instructions section of EMR for full details.  Patient was counseled important signs and symptoms that should prompt return to medical care, changes in medications, dietary instructions, activity restrictions, and follow up appointments.  ? ?Follow-Up Appointments: ? ? ?Park Pope, MD ?09/01/2021, 11:47 AM ?PGY-1, [facility] ?

## 2021-09-01 NOTE — TOC Transition Note (Signed)
Transition of Care (TOC) - CM/SW Discharge Note ? ? ?Patient Details  ?Name: Craig Holden ?MRN: [id number] ?Date of Birth: 09-12-1955 ? ?Transition of Care (TOC) CM/SW Contact:  ?Kermit Balo, RN ?Phone Number: ?09/01/2021, 12:36 PM ? ? ?Clinical Narrative:    ?Patient is discharging home with outpatient therapy through [facility]. Information on the AVS.  ?Pt has needed supervision and transportation.  ?Patient manages his own medications at home. He admits not taking his depakote and states he will definitely take it after this admission. ?Pt states he has all DME at home.  ?Wife to provide transport home.  ? ? ?Final next level of care: OP Rehab ?Barriers to Discharge: No Barriers Identified ? ? ?Patient Goals and CMS Choice ?  ?  ?Choice offered to / list presented to : Patient ? ?Discharge Placement ?  ?           ?  ?  ?  ?  ? ?Discharge Plan and Services ?  ?  ?           ?  ?  ?  ?  ?  ?  ?  ?  ?  ?  ? ?Social Determinants of Health (SDOH) Interventions ?  ? ? ?Readmission Risk Interventions ?   ? View : No data to display.  ?  ?  ?  ? ? ? ? ? ?

## 2021-09-01 NOTE — Discharge Instructions (Addendum)
Dear [name], ? ?It was a pleasure taking care of you while you are in the hospital.  You were admitted due to having a witnessed seizure.  This appears to be due to you not taking your antiseizure medications.  You have been switched from Depakote to Keppra. Please take your Keppra as prescribed. Please take your other home medications as prescribed.  [facility] are unable to see your [facility] neurologist; however, you should follow-up with your outpatient neurologist within the month.  Please see your primary care provider in approximately 1 week. ? ?Take care! ?-[facility] ?

## 2021-09-15 ENCOUNTER — Ambulatory Visit: Payer: Medicare Other | Admitting: Rehabilitation

## 2023-01-20 ENCOUNTER — Emergency Department (HOSPITAL_COMMUNITY): Payer: Medicare PPO

## 2023-01-20 ENCOUNTER — Inpatient Hospital Stay (HOSPITAL_COMMUNITY)
Admission: EM | Admit: 2023-01-20 | Discharge: 2023-01-23 | DRG: 100 | Disposition: A | Discharge status: Home / Self Care | Payer: Medicare PPO | Attending: Internal Medicine | Admitting: Internal Medicine

## 2023-01-20 DIAGNOSIS — Z981 Arthrodesis status: Secondary | ICD-10-CM

## 2023-01-20 DIAGNOSIS — F17210 Nicotine dependence, cigarettes, uncomplicated: Secondary | ICD-10-CM | POA: Diagnosis present

## 2023-01-20 DIAGNOSIS — R569 Unspecified convulsions: Secondary | ICD-10-CM | POA: Diagnosis not present

## 2023-01-20 DIAGNOSIS — G40409 Other generalized epilepsy and epileptic syndromes, not intractable, without status epilepticus: Principal | ICD-10-CM | POA: Diagnosis present

## 2023-01-20 DIAGNOSIS — R8281 Pyuria: Secondary | ICD-10-CM | POA: Diagnosis present

## 2023-01-20 DIAGNOSIS — F4310 Post-traumatic stress disorder, unspecified: Secondary | ICD-10-CM | POA: Diagnosis present

## 2023-01-20 DIAGNOSIS — Z885 Allergy status to narcotic agent status: Secondary | ICD-10-CM

## 2023-01-20 DIAGNOSIS — E876 Hypokalemia: Secondary | ICD-10-CM | POA: Diagnosis not present

## 2023-01-20 DIAGNOSIS — Z7982 Long term (current) use of aspirin: Secondary | ICD-10-CM

## 2023-01-20 DIAGNOSIS — Z8673 Personal history of transient ischemic attack (TIA), and cerebral infarction without residual deficits: Secondary | ICD-10-CM

## 2023-01-20 DIAGNOSIS — T4276XA Underdosing of unspecified antiepileptic and sedative-hypnotic drugs, initial encounter: Secondary | ICD-10-CM | POA: Diagnosis present

## 2023-01-20 DIAGNOSIS — R197 Diarrhea, unspecified: Secondary | ICD-10-CM | POA: Diagnosis not present

## 2023-01-20 DIAGNOSIS — Z7902 Long term (current) use of antithrombotics/antiplatelets: Secondary | ICD-10-CM

## 2023-01-20 DIAGNOSIS — Z8249 Family history of ischemic heart disease and other diseases of the circulatory system: Secondary | ICD-10-CM

## 2023-01-20 DIAGNOSIS — G8194 Hemiplegia, unspecified affecting left nondominant side: Secondary | ICD-10-CM | POA: Diagnosis present

## 2023-01-20 DIAGNOSIS — E8809 Other disorders of plasma-protein metabolism, not elsewhere classified: Secondary | ICD-10-CM | POA: Diagnosis present

## 2023-01-20 DIAGNOSIS — G9340 Encephalopathy, unspecified: Secondary | ICD-10-CM | POA: Diagnosis present

## 2023-01-20 DIAGNOSIS — R000 Tachycardia, unspecified: Secondary | ICD-10-CM | POA: Diagnosis present

## 2023-01-20 DIAGNOSIS — J180 Bronchopneumonia, unspecified organism: Secondary | ICD-10-CM | POA: Diagnosis present

## 2023-01-20 DIAGNOSIS — R32 Unspecified urinary incontinence: Secondary | ICD-10-CM | POA: Diagnosis present

## 2023-01-20 DIAGNOSIS — I10 Essential (primary) hypertension: Secondary | ICD-10-CM | POA: Diagnosis present

## 2023-01-20 DIAGNOSIS — R159 Full incontinence of feces: Secondary | ICD-10-CM | POA: Diagnosis present

## 2023-01-20 DIAGNOSIS — Z79899 Other long term (current) drug therapy: Secondary | ICD-10-CM

## 2023-01-20 DIAGNOSIS — Z1152 Encounter for screening for COVID-19: Secondary | ICD-10-CM

## 2023-01-20 DIAGNOSIS — Z888 Allergy status to other drugs, medicaments and biological substances status: Secondary | ICD-10-CM

## 2023-01-20 MED ORDER — SODIUM CHLORIDE 0.9 % IV BOLUS
1000.0000 mL | Freq: Once | INTRAVENOUS | Status: AC
  Administered 2023-01-21: 1000 mL via INTRAVENOUS

## 2023-01-20 NOTE — ED Triage Notes (Signed)
Pt BIB [facility] from home with c/o of seizure. Family reports pt was found to be posturing, unresponsive for about 1 minute, with urinary and fecal incontinence. Pt initially combative and word salad speech with EMS on scene. Hx of absent seizures with three prior strokes. Pt remains to be confused on arrival to ED, not really wanting to talk, drowsy.

## 2023-01-21 ENCOUNTER — Emergency Department (HOSPITAL_COMMUNITY): Payer: Medicare PPO

## 2023-01-21 ENCOUNTER — Inpatient Hospital Stay (HOSPITAL_COMMUNITY): Payer: Medicare PPO

## 2023-01-21 DIAGNOSIS — R197 Diarrhea, unspecified: Secondary | ICD-10-CM | POA: Diagnosis not present

## 2023-01-21 DIAGNOSIS — G8194 Hemiplegia, unspecified affecting left nondominant side: Secondary | ICD-10-CM | POA: Diagnosis present

## 2023-01-21 DIAGNOSIS — Z888 Allergy status to other drugs, medicaments and biological substances status: Secondary | ICD-10-CM | POA: Diagnosis not present

## 2023-01-21 DIAGNOSIS — F17210 Nicotine dependence, cigarettes, uncomplicated: Secondary | ICD-10-CM | POA: Diagnosis present

## 2023-01-21 DIAGNOSIS — Z1152 Encounter for screening for COVID-19: Secondary | ICD-10-CM | POA: Diagnosis not present

## 2023-01-21 DIAGNOSIS — J180 Bronchopneumonia, unspecified organism: Secondary | ICD-10-CM | POA: Diagnosis present

## 2023-01-21 DIAGNOSIS — Z7982 Long term (current) use of aspirin: Secondary | ICD-10-CM | POA: Diagnosis not present

## 2023-01-21 DIAGNOSIS — R32 Unspecified urinary incontinence: Secondary | ICD-10-CM | POA: Diagnosis present

## 2023-01-21 DIAGNOSIS — G40409 Other generalized epilepsy and epileptic syndromes, not intractable, without status epilepticus: Secondary | ICD-10-CM | POA: Diagnosis present

## 2023-01-21 DIAGNOSIS — R8281 Pyuria: Secondary | ICD-10-CM | POA: Diagnosis present

## 2023-01-21 DIAGNOSIS — E8809 Other disorders of plasma-protein metabolism, not elsewhere classified: Secondary | ICD-10-CM | POA: Diagnosis present

## 2023-01-21 DIAGNOSIS — T4276XA Underdosing of unspecified antiepileptic and sedative-hypnotic drugs, initial encounter: Secondary | ICD-10-CM | POA: Diagnosis present

## 2023-01-21 DIAGNOSIS — Z981 Arthrodesis status: Secondary | ICD-10-CM | POA: Diagnosis not present

## 2023-01-21 DIAGNOSIS — Z7902 Long term (current) use of antithrombotics/antiplatelets: Secondary | ICD-10-CM | POA: Diagnosis not present

## 2023-01-21 DIAGNOSIS — F4310 Post-traumatic stress disorder, unspecified: Secondary | ICD-10-CM | POA: Diagnosis present

## 2023-01-21 DIAGNOSIS — G9340 Encephalopathy, unspecified: Secondary | ICD-10-CM | POA: Diagnosis present

## 2023-01-21 DIAGNOSIS — Z8249 Family history of ischemic heart disease and other diseases of the circulatory system: Secondary | ICD-10-CM | POA: Diagnosis not present

## 2023-01-21 DIAGNOSIS — Z8673 Personal history of transient ischemic attack (TIA), and cerebral infarction without residual deficits: Secondary | ICD-10-CM | POA: Diagnosis not present

## 2023-01-21 DIAGNOSIS — R000 Tachycardia, unspecified: Secondary | ICD-10-CM | POA: Diagnosis present

## 2023-01-21 DIAGNOSIS — I10 Essential (primary) hypertension: Secondary | ICD-10-CM | POA: Diagnosis present

## 2023-01-21 DIAGNOSIS — Z885 Allergy status to narcotic agent status: Secondary | ICD-10-CM | POA: Diagnosis not present

## 2023-01-21 DIAGNOSIS — R159 Full incontinence of feces: Secondary | ICD-10-CM | POA: Diagnosis present

## 2023-01-21 DIAGNOSIS — R569 Unspecified convulsions: Secondary | ICD-10-CM | POA: Diagnosis present

## 2023-01-21 DIAGNOSIS — E876 Hypokalemia: Secondary | ICD-10-CM | POA: Diagnosis not present

## 2023-01-21 DIAGNOSIS — Z79899 Other long term (current) drug therapy: Secondary | ICD-10-CM | POA: Diagnosis not present

## 2023-01-21 LAB — URINALYSIS, ROUTINE W REFLEX MICROSCOPIC
Bacteria, UA: NONE SEEN
Bilirubin Urine: NEGATIVE
Glucose, UA: NEGATIVE mg/dL
Hgb urine dipstick: NEGATIVE
Ketones, ur: NEGATIVE mg/dL
Nitrite: NEGATIVE
Protein, ur: 30 mg/dL — AB
Specific Gravity, Urine: 1.026 (ref 1.005–1.030)
pH: 7 (ref 5.0–8.0)

## 2023-01-21 LAB — COMPREHENSIVE METABOLIC PANEL
ALT: 12 U/L (ref 0–44)
ALT: 13 U/L (ref 0–44)
AST: 20 U/L (ref 15–41)
AST: 20 U/L (ref 15–41)
Albumin: 3.2 g/dL — ABNORMAL LOW (ref 3.5–5.0)
Albumin: 3 g/dL — ABNORMAL LOW (ref 3.5–5.0)
Alkaline Phosphatase: 103 U/L (ref 38–126)
Alkaline Phosphatase: 100 U/L (ref 38–126)
Anion gap: 11 (ref 5–15)
Anion gap: 9 (ref 5–15)
BUN: 6 mg/dL — ABNORMAL LOW (ref 8–23)
BUN: 6 mg/dL — ABNORMAL LOW (ref 8–23)
CO2: 23 mmol/L (ref 22–32)
CO2: 24 mmol/L (ref 22–32)
Calcium: 8.6 mg/dL — ABNORMAL LOW (ref 8.9–10.3)
Calcium: 8.2 mg/dL — ABNORMAL LOW (ref 8.9–10.3)
Chloride: 103 mmol/L (ref 98–111)
Chloride: 105 mmol/L (ref 98–111)
Creatinine, Ser: 0.89 mg/dL (ref 0.61–1.24)
Creatinine, Ser: 0.97 mg/dL (ref 0.61–1.24)
GFR, Estimated: >60 mL/min (ref >60)
GFR, Estimated: >60 mL/min (ref >60)
Glucose, Bld: 109 mg/dL — ABNORMAL HIGH (ref 70–99)
Glucose, Bld: 99 mg/dL (ref 70–99)
Potassium: 3.7 mmol/L (ref 3.5–5.1)
Potassium: 3.3 mmol/L — ABNORMAL LOW (ref 3.5–5.1)
Sodium: 137 mmol/L (ref 135–145)
Sodium: 138 mmol/L (ref 135–145)
Total Bilirubin: 0.5 mg/dL (ref 0.3–1.2)
Total Bilirubin: 1 mg/dL (ref 0.3–1.2)
Total Protein: 6.6 g/dL (ref 6.5–8.1)
Total Protein: 6.1 g/dL — ABNORMAL LOW (ref 6.5–8.1)

## 2023-01-21 LAB — LIPID PANEL
Cholesterol: 133 mg/dL (ref 0–200)
HDL: 35 mg/dL — ABNORMAL LOW (ref >40)
LDL Cholesterol: 88 mg/dL (ref 0–99)
Total CHOL/HDL Ratio: 3.8 RATIO
Triglycerides: 48 mg/dL (ref <150)
VLDL: 10 mg/dL (ref 0–40)

## 2023-01-21 LAB — CBC
HCT: 36.3 % — ABNORMAL LOW (ref 39.0–52.0)
HCT: 40.5 % (ref 39.0–52.0)
Hemoglobin: 12.9 g/dL — ABNORMAL LOW (ref 13.0–17.0)
Hemoglobin: 13.7 g/dL (ref 13.0–17.0)
MCH: 31.8 pg (ref 26.0–34.0)
MCH: 33.2 pg (ref 26.0–34.0)
MCHC: 33.8 g/dL (ref 30.0–36.0)
MCHC: 35.5 g/dL (ref 30.0–36.0)
MCV: 93.3 fL (ref 80.0–100.0)
MCV: 94 fL (ref 80.0–100.0)
Platelets: 185 10*3/uL (ref 150–400)
Platelets: 197 10*3/uL (ref 150–400)
RBC: 3.89 MIL/uL — ABNORMAL LOW (ref 4.22–5.81)
RBC: 4.31 MIL/uL (ref 4.22–5.81)
RDW: 14 % (ref 11.5–15.5)
RDW: 14 % (ref 11.5–15.5)
WBC: 10.1 10*3/uL (ref 4.0–10.5)
WBC: 10.1 10*3/uL (ref 4.0–10.5)
nRBC: 0 % (ref 0.0–0.2)
nRBC: 0 % (ref 0.0–0.2)

## 2023-01-21 LAB — RAPID URINE DRUG SCREEN, HOSP PERFORMED
Amphetamines: NOT DETECTED
Barbiturates: NOT DETECTED
Benzodiazepines: POSITIVE — AB
Cocaine: NOT DETECTED
Opiates: NOT DETECTED
Tetrahydrocannabinol: POSITIVE — AB

## 2023-01-21 LAB — PROTIME-INR
INR: 1.1 (ref 0.8–1.2)
Prothrombin Time: 14 s (ref 11.4–15.2)

## 2023-01-21 LAB — SARS CORONAVIRUS 2 BY RT PCR: SARS Coronavirus 2 by RT PCR: NEGATIVE

## 2023-01-21 LAB — MAGNESIUM
Magnesium: 1.8 mg/dL (ref 1.7–2.4)
Magnesium: 1.7 mg/dL (ref 1.7–2.4)

## 2023-01-21 LAB — MRSA NEXT GEN BY PCR, NASAL: MRSA by PCR Next Gen: NOT DETECTED

## 2023-01-21 LAB — TSH: TSH: 1.029 u[IU]/mL (ref 0.350–4.500)

## 2023-01-21 LAB — ETHANOL: Alcohol, Ethyl (B): <10 mg/dL (ref <10)

## 2023-01-21 LAB — PROCALCITONIN: Procalcitonin: <0.1 ng/mL

## 2023-01-21 MED ORDER — LEVETIRACETAM IN NACL 500 MG/100ML IV SOLN
500.0000 mg | Freq: Two times a day (BID) | INTRAVENOUS | Status: DC
  Administered 2023-01-21 – 2023-01-22 (×4): 500 mg via INTRAVENOUS
  Filled 2023-01-21 (×4): qty 100

## 2023-01-21 MED ORDER — POTASSIUM CHLORIDE 10 MEQ/100ML IV SOLN
10.0000 meq | INTRAVENOUS | Status: AC
  Administered 2023-01-21 (×2): 10 meq via INTRAVENOUS
  Filled 2023-01-21 (×2): qty 100

## 2023-01-21 MED ORDER — ASPIRIN 81 MG PO TBEC
81.0000 mg | DELAYED_RELEASE_TABLET | Freq: Every day | ORAL | Status: DC
  Administered 2023-01-22 – 2023-01-23 (×2): 81 mg via ORAL
  Filled 2023-01-21 (×2): qty 1

## 2023-01-21 MED ORDER — LEVETIRACETAM IN NACL 1000 MG/100ML IV SOLN
1000.0000 mg | INTRAVENOUS | Status: AC
  Administered 2023-01-21 (×2): 1000 mg via INTRAVENOUS
  Filled 2023-01-21: qty 100

## 2023-01-21 MED ORDER — LEVETIRACETAM ER 500 MG PO TB24: 500.0000 mg | ORAL_TABLET | Freq: Two times a day (BID) | ORAL | Status: DC

## 2023-01-21 MED ORDER — ACETAMINOPHEN 325 MG PO TABS: 650.0000 mg | ORAL_TABLET | Freq: Four times a day (QID) | ORAL | Status: DC | PRN

## 2023-01-21 MED ORDER — SODIUM CHLORIDE 0.9 % IV SOLN
2.0000 g | INTRAVENOUS | Status: DC
  Administered 2023-01-21: 2 g via INTRAVENOUS
  Filled 2023-01-21: qty 20

## 2023-01-21 MED ORDER — LACTATED RINGERS IV BOLUS
500.0000 mL | Freq: Once | INTRAVENOUS | Status: AC
  Administered 2023-01-21: 500 mL via INTRAVENOUS

## 2023-01-21 MED ORDER — ONDANSETRON HCL 4 MG/2ML IJ SOLN: 4.0000 mg | Freq: Four times a day (QID) | INTRAMUSCULAR | Status: DC | PRN

## 2023-01-21 MED ORDER — SODIUM CHLORIDE 0.9 % IV SOLN: 2000.0000 mg | Freq: Once | INTRAVENOUS | Status: DC

## 2023-01-21 MED ORDER — ACETAMINOPHEN 650 MG RE SUPP: 650.0000 mg | Freq: Four times a day (QID) | RECTAL | Status: DC | PRN

## 2023-01-21 MED ORDER — LORAZEPAM 2 MG/ML IJ SOLN
4.0000 mg | Freq: Once | INTRAMUSCULAR | Status: AC
  Administered 2023-01-21: 4 mg via INTRAVENOUS
  Filled 2023-01-21: qty 2

## 2023-01-21 MED ORDER — SODIUM CHLORIDE 0.9 % IV SOLN
500.0000 mg | INTRAVENOUS | Status: DC
  Administered 2023-01-21 – 2023-01-22 (×2): 500 mg via INTRAVENOUS
  Filled 2023-01-21 (×3): qty 5

## 2023-01-21 NOTE — ED Notes (Signed)
Changed patient's soiled sheets and chux pads. Placed brief. Tolerated well. Pt more compliant at this time.

## 2023-01-21 NOTE — ED Notes (Signed)
Family reports pt had seizure with posturing at home, unresponsive for about 1 minute, with fecal and urinary incontinence. EMS report pt has hx of absent seizures. Pt was noted to be combative initially on EMS arrival to scene. In ED, pt confused on arrival to room, poorly following commands, noted fecal incontinence. Pt drowsy and postictal on ED arrival.

## 2023-01-21 NOTE — ED Notes (Signed)
This RN with tech took patient for CT scan and successfully completed.

## 2023-01-21 NOTE — Progress Notes (Signed)
  Carryover admission to the Day Admitter.  I discussed this case with the EDP, Dr. Pilar Plate.  Per these discussions:   This is a 67 year old male with history of seizure disorder, multiple prior hospitalizations for tonic-clonic seizures with prolonged postictal state, who is being admitted with seizure associated with prolonged postictal state after experiencing a witnessed tonic-clonic seizure at home.  No additional tonic-clonic episodes since arriving in the emergency department this evening.  EDP has d/w on-call neurology, Dr. Wilford Corner, who will formally consult.  In the meantime, Dr. Wilford Corner recommends holding off on LP.  The patient has received a dose of IV Keppra  I have placed an order for observation for further evaluation management of the above.  I have placed some additional preliminary admit orders via the adult multi-morbid admission order set. I have also ordered serial neurochecks, n.p.o., seizure precautions, and serum magnesium level.    Craig Pigg, DO Hospitalist

## 2023-01-21 NOTE — Plan of Care (Signed)
  Problem: Clinical Measurements: Goal: Respiratory complications will improve Outcome: Progressing Goal: Cardiovascular complication will be avoided Outcome: Progressing   Problem: Elimination: Goal: Will not experience complications related to urinary retention Outcome: Progressing   Problem: Skin Integrity: Goal: Risk for impaired skin integrity will decrease Outcome: Progressing

## 2023-01-21 NOTE — ED Notes (Signed)
ED TO INPATIENT HANDOFF REPORT  ED Nurse Name and Phone #: Selena Lesser Amaiya Scruton/ [phone]  S Name/Age/Gender [name] 67 y.o. male Room/Bed: 027C/027C  Code Status   Code Status: Full Code  Home/SNF/Other Home Patient oriented to: self Is this baseline? No   Triage Complete: Triage complete  Chief Complaint Seizure [facility]) [R56.9]  Triage Note Pt BIB [facility] from home with c/o of seizure. Family reports pt was found to be posturing, unresponsive for about 1 minute, with urinary and fecal incontinence. Pt initially combative and word salad speech with EMS on scene. Hx of absent seizures with three prior strokes. Pt remains to be confused on arrival to ED, not really wanting to talk, drowsy.    Allergies Allergies  Allergen Reactions   Morphine Sulfate Nausea And Vomiting   Sertraline Nausea And Vomiting    Level of Care/Admitting Diagnosis ED Disposition     ED Disposition  Admit   Condition  --   Comment  Hospital Area: [facility] [[id number]]  Level of Care: Progressive [102]  Admit to Progressive based on following criteria: MULTISYSTEM THREATS such as stable sepsis, metabolic/electrolyte imbalance with or without encephalopathy that is responding to early treatment.  May place patient in observation at [facility] or [facility] if equivalent level of care is available:: No  Covid Evaluation: Asymptomatic - no recent exposure (last 10 days) testing not required  Diagnosis: Seizure [facility]) [[id number]]  Admitting Physician: Angie Fava [[id number]]  Attending Physician: Angie Fava [[id number]]          B Medical/Surgery History Past Medical History:  Diagnosis Date   CVA (cerebral infarction)    Depression    Hypertension    Migraine    Nerve damage    PTSD (post-traumatic stress disorder)    Past Surgical History:  Procedure Laterality Date   ANTERIOR CERVICAL DECOMPRESSION/DISCECTOMY FUSION 4 LEVELS  01/20/2015   C4-C7   BACK  SURGERY     HEMORROIDECTOMY     HIP SURGERY     RADIOLOGY WITH ANESTHESIA N/A 04/18/2015   Procedure: RADIOLOGY WITH ANESTHESIA;  Surgeon: Julieanne Cotton, MD;  Location: [facility] OR;  Service: Radiology;  Laterality: N/A;     A IV Location/Drains/Wounds Patient Lines/Drains/Airways Status     Active Line/Drains/Airways     Name Placement date Placement time Site Days   Peripheral IV 01/21/23 20 G Right;Posterior Forearm 01/21/23  0029  Forearm  less than 1            Intake/Output Last 24 hours No intake or output data in the 24 hours ending 01/21/23 0620  Labs/Imaging Results for orders placed or performed during the hospital encounter of 01/20/23 (from the past 48 hour(s))  CBC     Status: None   Collection Time: 01/20/23 11:44 PM  Result Value Ref Range   WBC 10.1 4.0 - 10.5 K/uL   RBC 4.31 4.22 - 5.81 MIL/uL   Hemoglobin 13.7 13.0 - 17.0 g/dL   HCT 40.1 02.7 - 25.3 %   MCV 94.0 80.0 - 100.0 fL   MCH 31.8 26.0 - 34.0 pg   MCHC 33.8 30.0 - 36.0 g/dL   RDW 66.4 40.3 - 47.4 %   Platelets 197 150 - 400 K/uL   nRBC 0.0 0.0 - 0.2 %    Comment: Performed at [facility], [address], [place], [place] [address]  Comprehensive metabolic panel     Status: Abnormal   Collection Time: 01/20/23  11:44 PM  Result Value Ref Range   Sodium 137 135 - 145 mmol/L   Potassium 3.7 3.5 - 5.1 mmol/L   Chloride 103 98 - 111 mmol/L   CO2 23 22 - 32 mmol/L   Glucose, Bld 109 (H) 70 - 99 mg/dL    Comment: Glucose reference range applies only to samples taken after fasting for at least 8 hours.   BUN 6 (L) 8 - 23 mg/dL   Creatinine, Ser 1.61 0.61 - 1.24 mg/dL   Calcium 8.6 (L) 8.9 - 10.3 mg/dL   Total Protein 6.6 6.5 - 8.1 g/dL   Albumin 3.2 (L) 3.5 - 5.0 g/dL   AST 20 15 - 41 U/L   ALT 12 0 - 44 U/L   Alkaline Phosphatase 103 38 - 126 U/L   Total Bilirubin 0.5 0.3 - 1.2 mg/dL   GFR, Estimated >09 >60 mL/min    Comment: (NOTE) Calculated using the CKD-EPI Creatinine Equation  (2021)    Anion gap 11 5 - 15    Comment: Performed at [facility], [address], [place], [place] [address]  Protime-INR     Status: None   Collection Time: 01/20/23 11:44 PM  Result Value Ref Range   Prothrombin Time 14.0 11.4 - 15.2 seconds   INR 1.1 0.8 - 1.2    Comment: (NOTE) INR goal varies based on device and disease states. Performed at [facility], [address] [place], [place] [address]   Ethanol     Status: None   Collection Time: 01/20/23 11:44 PM  Result Value Ref Range   Alcohol, Ethyl (B) <10 <10 mg/dL    Comment: (NOTE) Lowest detectable limit for serum alcohol is 10 mg/dL.  For medical purposes only. Performed at [facility], [address], [place], [place] [address]    DG Chest Port 1 View  Result Date: 01/21/2023 CLINICAL DATA:  67 year old male with history of fever. Possible seizure at home. EXAM: PORTABLE CHEST 1 VIEW COMPARISON:  Chest x-ray 08/31/2021. FINDINGS: Lung volumes are normal. Patchy areas of interstitial prominence, peribronchial cuffing and ill-defined airspace consolidation are noted, most evident in the left upper lobe. Lower lungs appear well aerated. No pleural effusions. No pneumothorax. No evidence of pulmonary edema. Heart size is normal. Upper mediastinal contours are within normal limits. IMPRESSION: 1. The appearance of the chest is suggestive of bronchitis with developing left upper lobe bronchopneumonia. Followup PA and lateral chest X-ray is recommended in 3-4 weeks following trial of antibiotic therapy to ensure resolution and exclude underlying malignancy. Electronically Signed   By: Trudie Reed M.D.   On: 01/21/2023 06:00   CT HEAD WO CONTRAST ( )  Result Date: 01/21/2023 CLINICAL DATA:  Recent seizure disorder EXAM: CT HEAD WITHOUT CONTRAST TECHNIQUE: Contiguous axial images were obtained from the base of the skull through the vertex without intravenous contrast. RADIATION DOSE REDUCTION: This exam  was performed according to the departmental dose-optimization program which includes automated exposure control, adjustment of the mA and/or kV according to patient size and/or use of iterative reconstruction technique. COMPARISON:  08/31/2021 FINDINGS: Brain: No evidence of acute infarction, hemorrhage, hydrocephalus, extra-axial collection or mass lesion/mass effect. There are changes consistent with prior left MCA infarct. Vascular: Prominent dural vein is noted on the right stable from multiple previous exams dating back to 2022. Skull: Normal. Negative for fracture or focal lesion. Sinuses/Orbits: No acute finding. Other: None. IMPRESSION: No acute intracranial abnormality noted. Electronically Signed  By: Alcide Clever M.D.   On: 01/21/2023 02:06    Pending Labs Unresulted Labs (From admission, onward)     Start     Ordered   01/21/23 0612  Magnesium  Add-on,   AD        01/21/23 0611   01/21/23 0543  SARS Coronavirus 2 by RT PCR (hospital order, performed in [facility] hospital lab) *cepheid single result test* Anterior Nasal Swab  (Tier 2 - SARS Coronavirus 2 by RT PCR (hospital order, performed in [facility] hospital lab) *cepheid single result test*)  Once,   URGENT        01/21/23 0542   01/21/23 0543  Urinalysis, Routine w reflex microscopic -Urine, Clean Catch  Once,   URGENT       Question:  Specimen Source  Answer:  Urine, Clean Catch   01/21/23 0542   01/20/23 2340  Levetiracetam level  Once,   URGENT        01/20/23 2339            Vitals/Pain Today's Vitals   01/21/23 0300 01/21/23 0345 01/21/23 0400 01/21/23 0500  BP: 138/84  (!) 140/97 138/72  Pulse: (!) 112  (!) 110 (!) 107  Resp:      Temp:  100 F (37.8 C)    TempSrc:  Axillary    SpO2: 96%  94% 96%  Weight:        Isolation Precautions Airborne and Contact precautions  Medications Medications  acetaminophen (TYLENOL) tablet 650 mg (has no administration in time range)    Or  acetaminophen (TYLENOL)  suppository 650 mg (has no administration in time range)  ondansetron (ZOFRAN) injection 4 mg (has no administration in time range)  sodium chloride 0.9 % bolus 1,000 mL (0 mLs Intravenous Stopped 01/21/23 0316)  LORazepam (ATIVAN) injection 4 mg (4 mg Intravenous Given 01/21/23 0138)  levETIRAcetam (KEPPRA) IVPB 1000 mg/100 mL premix (0 mg Intravenous Stopped 01/21/23 0345)    Mobility walks     Focused Assessments Neuro Assessment Handoff:  Swallow screen pass?  AMS/confused/unable to perform screen at this time.          Neuro Assessment: Exceptions to WDL (Pt noted to still be confused, post ictal, poorly following commands. responds to verbal stimuli but delayed.) Neuro Checks:      Has TPA been given? No If patient is a Neuro Trauma and patient is going to OR before floor call report to 4N Charge nurse: [phone] or [phone]   R Recommendations: See Admitting Provider Note  Report given to:   Additional Notes: Pt more alert and compliant at this time. Follows commands.

## 2023-01-21 NOTE — ED Notes (Signed)
Going for CT at this time.

## 2023-01-21 NOTE — Progress Notes (Signed)
EEG complete - results pending 

## 2023-01-21 NOTE — Consult Note (Signed)
Neurology Consultation  Reason for Consult: Seizure Referring Physician: Dr. Kennis Carina  CC: Seizures  History is obtained from: Chart  HPI: Craig Holden is a 67 y.o. 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  Family reported that he was found posturing and unresponsive for about 1 minute with urinary and fecal incontinence.  He remained confused on arrival to the ED and several hours after the seizure at home still remained pretty encephalopathic for which neurological consultation was obtained. CBC and BMP were unremarkable. Vitals showed some tachycardia. He is more awake now than he was at presentation according to his RN but still not back to baseline per report.  I was called by the ED provider a few hours ago and recommended that he be given a loading dose of Keppra in addition to the benzo, which was given but he still is not back to baseline several hours after.  ROS: Ascertain due to his mentation  Past Medical History:  Diagnosis Date   CVA (cerebral infarction)    Depression    Hypertension    Migraine    Nerve damage    PTSD (post-traumatic stress disorder)    Family History  Problem Relation Age of Onset   Heart attack Father    Emphysema Father    Hypertension Mother    Diabetes Mother    Social History:   reports that he has been smoking cigarettes. He has never used smokeless tobacco. He reports current drug use. Drug: Marijuana. He reports that he does not drink alcohol.  Medications No current facility-administered medications for this encounter.  Current Outpatient Medications:    acetaminophen (TYLENOL) 325 MG tablet, Take 650 mg by mouth 3 (three) times daily as  needed for headache., Disp: , Rfl:    albuterol (PROVENTIL HFA;VENTOLIN HFA) 108 (90 BASE) MCG/ACT inhaler, Inhale 1-2 puffs into the lungs every 6 (six) hours as needed for wheezing., Disp: 1 Inhaler, Rfl: 0   aspirin EC 81 MG tablet, Take 81 mg by mouth daily. Swallow whole., Disp: , Rfl:    atorvastatin (LIPITOR) 80 MG tablet, Take 1 tablet (80 mg total) by mouth at bedtime. (Patient not taking: Reported on 09/01/2021), Disp: 30 tablet, Rfl: 0   clopidogrel (PLAVIX) 75 MG tablet, Take 75 mg by mouth daily., Disp: , Rfl:    fluticasone (FLONASE) 50 MCG/ACT nasal spray, Place 2 sprays into both nostrils daily as needed (nasal congestion)., Disp: , Rfl:    hydrochlorothiazide (HYDRODIURIL) 25 MG tablet, Take 12.5 mg by mouth daily. , Disp: , Rfl:    levETIRAcetam ER 1000 MG TB24, Take 1,000 mg by mouth daily., Disp: 30 tablet, Rfl: 0   losartan (COZAAR) 100 MG tablet, Take 50 mg by mouth daily., Disp: , Rfl:    Multiple Vitamin (MULTIVITAMIN) tablet, Take 1 tablet by mouth daily., Disp: , Rfl:    nicotine (NICODERM CQ - DOSED IN MG/24 HOURS) 21 mg/24hr patch, Place 1 patch (21 mg total) onto the skin daily. (Patient not taking: Reported on 08/02/2021), Disp: 28 patch, Rfl: 0   sildenafil (VIAGRA) 100 MG tablet, Take 100 mg by mouth daily as needed for erectile dysfunction., Disp: , Rfl:    vitamin B-12 (CYANOCOBALAMIN) 500 MCG tablet, Take 500 mcg  by mouth daily., Disp: , Rfl:   Exam: Current vital signs: BP 138/72   Pulse (!) 107   Temp 100 F (37.8 C) (Axillary)   Resp 20   Wt 81.6 kg   SpO2 96%   BMI 23.41 kg/m  Vital signs in last 24 hours: Temp:  [98.6 F (37 C)-100 F (37.8 C)] 100 F (37.8 C) (08/24 0345) Pulse Rate:  [79-112] 107 (08/24 0500) Resp:  [20] 20 (08/23 2336) BP: (138-151)/(72-97) 138/72 (08/24 0500) SpO2:  [94 %-96 %] 96 % (08/24 0500) Weight:  [81.6 kg] 81.6 kg (08/23 2338) General: Drowsy, opens eyes to voice, follows simple commands HEENT: Normocephalic  atraumatic, no significant neck stiffness noted Lungs: Rales scattered Abdomen nondistended nontender Extremities: Warm well-perfused. Neurological exam He is drowsy, opens eyes to voice, follows simple commands He is able to tell me his name, cannot tell me the correct month or age. Cannot tell me which hospital he is in. Keeps falling asleep during this examination. Poor attention concentration Cranial nerves: Pupils are equal round reactive to light, extraocular movements difficult to assess but gaze is midline and he blinks to threat from both sides although he does not follow commands to follow my finger consistently on both sides.  His face appears grossly symmetric. Motor examination he has mild right hemiparesis in comparison to the left. Sensation intact as evidenced by withdrawal to noxious stimulation Kernig's negative.  Brudzinski is negative.  Labs I have reviewed labs in epic and the results pertinent to this consultation are:  CBC    Component Value Date/Time   WBC 10.1 01/20/2023 2344   RBC 4.31 01/20/2023 2344   HGB 13.7 01/20/2023 2344   HCT 40.5 01/20/2023 2344   PLT 197 01/20/2023 2344   MCV 94.0 01/20/2023 2344   MCH 31.8 01/20/2023 2344   MCHC 33.8 01/20/2023 2344   RDW 14.0 01/20/2023 2344   LYMPHSABS 1.7 08/31/2021 2031   MONOABS 1.0 08/31/2021 2031   EOSABS 0.0 08/31/2021 2031   BASOSABS 0.0 08/31/2021 2031    CMP     Component Value Date/Time   NA 137 01/20/2023 2344   K 3.7 01/20/2023 2344   CL 103 01/20/2023 2344   CO2 23 01/20/2023 2344   GLUCOSE 109 (H) 01/20/2023 2344   BUN 6 (L) 01/20/2023 2344   CREATININE 0.89 01/20/2023 2344   CALCIUM 8.6 (L) 01/20/2023 2344   PROT 6.6 01/20/2023 2344   ALBUMIN 3.2 (L) 01/20/2023 2344   AST 20 01/20/2023 2344   ALT 12 01/20/2023 2344   ALKPHOS 103 01/20/2023 2344   BILITOT 0.5 01/20/2023 2344   GFRNONAA >60 01/20/2023 2344   GFRAA >60 11/16/2017 1750    Lipid Panel     Component Value  Date/Time   CHOL 163 08/02/2021 0346   TRIG 79 08/02/2021 0346   HDL 37 (L) 08/02/2021 0346   CHOLHDL 4.4 08/02/2021 0346   VLDL 16 08/02/2021 0346   LDLCALC 110 (H) 08/02/2021 0346    Lab Results  Component Value Date   HGBA1C 4.9 08/02/2021      Imaging I have reviewed the images obtained:  CT head: Prior left MCA infarct unchanged from before.  No acute findings.  Chest x-ray with left upper lobe pneumonia  Assessment: 67 year old man past history of PTSD, migraines, hypertension, left MCA stroke secondary to left ICA occlusion status post stenting without much in terms of residual deficits, seizures, noncompliance to medication and prior episodes of breakthrough seizures in the setting  of medication noncompliance brought in for breakthrough seizure at home later last evening. Received benzodiazepine and loading dose of Keppra but still remains somewhat encephalopathic for which she is being admitted. Also likely has prolonged postictal state because of poor brain reserve and in the past also has had prolonged postictal states. His axillary temperature was 100 Fahrenheit.  He has no leukocytosis.  He has no frank neck stiffness or Kernig's or Brudzinski sign. His lungs do have scattered rales and chest x-ray looks consistent with pneumonia  Impression: Breakthrough seizure-question medication noncompliance versus likely lowering of seizure threshold in the setting of acute illness.   Recommendations: He is on Keppra 1000 mg daily at home-extended release form for compliance. While admitted, I would recommend keeping him on Keppra 500 mg twice daily Maintain seizure precautions Treatment of pneumonia per primary team Routine EEG If his exam remains concerning or has another seizure, might consider prolonged EEG as well as further imaging by MRI. At this point I do not see a need for an LP because his chest x-ray clearly shows an evolving bronchopneumonia and is likely  contributed in lowering the seizure threshold.  Preliminary plan discussed with Dr. Kennis Carina in the ED.  Patient seen early morning around 6 AM.  Neurology will round on him tomorrow.  If he returns back to baseline, please touch base with the on-call neurologist during daytime if anticipating discharge.  -- Milon Dikes, MD Neurologist [facility] Pager: [phone]

## 2023-01-21 NOTE — Procedures (Signed)
Patient Name: Craig Holden  MRN: [id number]  Epilepsy Attending: Charlsie Quest  Referring Physician/Provider: Charolotte Eke, MD  Date: 01/21/2024 Duration: 23.11 mins  Patient history: 67 year old man past history of PTSD, migraines, hypertension, left MCA stroke secondary to left ICA occlusion status post stenting without much in terms of residual deficits, seizures, noncompliance to medication and prior episodes of breakthrough seizures in the setting of medication noncompliance brought in for breakthrough seizure at home later last evening. EEG to evaluate for seizure  Level of alertness: Awake  AEDs during EEG study: LEV  Technical aspects: This EEG study was done with scalp electrodes positioned according to the 10-20 International system of electrode placement. Electrical activity was reviewed with band pass filter of 1-70Hz , sensitivity of 7 uV/mm, display speed of 11mm/sec with a 60Hz  notched filter applied as appropriate. EEG data were recorded continuously and digitally stored.  Video monitoring was available and reviewed as appropriate.  Description: The posterior dominant rhythm consists of 8-9 Hz activity of moderate voltage (25-35 uV) seen predominantly in posterior head regions, symmetric and reactive to eye opening and eye closing. Sleep was characterized by vertex waves, sleep spindles (12 to 14 Hz), maximal frontocentral region. There is an excessive amount of 15 to 18 Hz beta activity distributed symmetrically and diffusely.  Hyperventilation and photic stimulation were not performed.     ABNORMALITY - Excessive beta, generalized  IMPRESSION: This study is within normal limits. The excessive beta activity seen in the background is most likely due to the effect of benzodiazepine and is a benign EEG pattern. No seizures or epileptiform discharges were seen throughout the recording.  A normal interictal EEG does not exclude the diagnosis of epilepsy.   Sharis Keeran Annabelle Harman

## 2023-01-21 NOTE — H&P (Signed)
History and Physical    Patient: Craig Holden SWF:[id number] DOB: Dec 22, 1955 DOA: 01/20/2023 DOS: the patient was seen and examined on 01/21/2023 PCP: Fleet Contras, MD  Patient coming from: Home  Chief Complaint:  Chief Complaint  Patient presents with   Seizures   HPI: Craig Holden is a 67 y.o. 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  Unable to obtain accurate history from patient due to altered mental status.  He is aware of situation, oriented to place, time, and self.  He reports he has been taking his medications as prescribed.  Denies any fevers, cough, shortness of breath, chest pain, or any other significant symptoms.   ED course: Neurology was consulted and recommended Keppra loading dose and IV benzodiazepine admission.  Patient was also started on a pneumonia treatment.  Review of Systems: unable to review all systems due to the inability of the patient to answer questions. Past Medical History:  Diagnosis Date   CVA (cerebral infarction)    Depression    Hypertension    Migraine    Nerve damage    PTSD (post-traumatic stress disorder)    Past Surgical History:  Procedure Laterality Date   ANTERIOR CERVICAL DECOMPRESSION/DISCECTOMY FUSION 4 LEVELS  01/20/2015   C4-C7   BACK SURGERY     HEMORROIDECTOMY     HIP SURGERY     RADIOLOGY WITH ANESTHESIA N/A 04/18/2015   Procedure: RADIOLOGY WITH ANESTHESIA;  Surgeon: Julieanne Cotton, MD;  Location: [facility] OR;  Service: Radiology;  Laterality: N/A;   Social History:  reports that he has been smoking cigarettes. He has never used smokeless tobacco. He reports current drug use. Drug: Marijuana. He reports that he does not drink alcohol.  Allergies  Allergen Reactions    Morphine Sulfate Nausea And Vomiting   Sertraline Nausea And Vomiting    Family History  Problem Relation Age of Onset   Heart attack Father    Emphysema Father    Hypertension Mother    Diabetes Mother     Prior to Admission medications   Medication Sig Start Date End Date Taking? Authorizing Provider  albuterol (PROVENTIL HFA;VENTOLIN HFA) 108 (90 BASE) MCG/ACT inhaler Inhale 1-2 puffs into the lungs every 6 (six) hours as needed for wheezing. 06/12/11  Yes Palumbo, April, MD  aspirin EC 81 MG tablet Take 81 mg by mouth daily. Swallow whole.   Yes [provider]  hydrochlorothiazide (HYDRODIURIL) 25 MG tablet Take 12.5 mg by mouth daily.    Yes [provider]  levETIRAcetam (KEPPRA) 500 MG tablet Take 500 mg by mouth 2 (two) times daily. 02/28/22  Yes [provider]  losartan (COZAAR) 100 MG tablet Take 50 mg by mouth daily.   Yes [provider]  sildenafil (VIAGRA) 100 MG tablet Take 100 mg by mouth daily as needed for erectile dysfunction.   Yes [provider]    Physical Exam: Vitals:   01/21/23 0600 01/21/23 0610 01/21/23 0740 01/21/23 0805  BP:  (!) 147/82 111/60 132/89  Pulse: (!) 106  94 93  Resp:   19 18  Temp:   99.2 F (37.3 C) 98.5 F (36.9 C)  TempSrc:    Oral  SpO2: 99%  97% 100%  Weight:       Physical Exam Vitals  and nursing note reviewed.  Constitutional:      General: He is not in acute distress.    Appearance: He is not diaphoretic.  HENT:     Head: Atraumatic.  Eyes:     Pupils: Pupils are equal, round, and reactive to light.  Cardiovascular:     Rate and Rhythm: Normal rate and regular rhythm.     Pulses: Normal pulses.     Heart sounds: No murmur heard. Pulmonary:     Effort: Pulmonary effort is normal. No respiratory distress.     Breath sounds: Normal breath sounds. No rales.  Abdominal:     General: Abdomen is flat. There is no distension.     Palpations: Abdomen is soft.     Tenderness:  There is no abdominal tenderness. There is no rebound.  Musculoskeletal:     Right lower leg: No edema.     Left lower leg: No edema.  Skin:    General: Skin is warm and dry.     Capillary Refill: Capillary refill takes less than 2 seconds.  Neurological:     Mental Status: He is oriented to person, place, and time. Mental status is at baseline. He is lethargic.     Cranial Nerves: No cranial nerve deficit.     Motor: Motor function is intact.  Psychiatric:        Mood and Affect: Mood normal.     Data Reviewed:     Latest Ref Rng & Units 01/20/2023   11:44 PM 09/01/2021    4:48 AM 08/31/2021    8:31 PM  CBC  WBC 4.0 - 10.5 K/uL 10.1  9.3  9.0   Hemoglobin 13.0 - 17.0 g/dL 40.9  81.1  91.4   Hematocrit 39.0 - 52.0 % 40.5  41.5  46.8   Platelets 150 - 400 K/uL 197  160  156       Latest Ref Rng & Units 01/20/2023   11:44 PM 09/01/2021    4:48 AM 08/31/2021    9:49 PM  BMP  Glucose 70 - 99 mg/dL 782  956  89   BUN 8 - 23 mg/dL 6  9  7    Creatinine 0.61 - 1.24 mg/dL 2.13  0.86  5.78   Sodium 135 - 145 mmol/L 137  137  138   Potassium 3.5 - 5.1 mmol/L 3.7  3.4  4.1   Chloride 98 - 111 mmol/L 103  105  105   CO2 22 - 32 mmol/L 23  24  23    Calcium 8.9 - 10.3 mg/dL 8.6  8.9  9.1      Assessment and Plan:  MITCHEAL Holden is a 67 y.o. 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  Hx of seizure with breakthrough seizure Post-ictal state Hx of left MCA CVA s/p stenting Family reported that he was found posturing and unresponsive for about 1 minute with urinary and fecal incontinence. He remained confused on arrival to the ED and several hours after the seizure at home. Neurology consulted in ED, trigger suspected due to PNA as below vs.  Medication non-compliance, received benzodiazepine and loading dose of Keppra.  CT head was normal. -per neuro recs: Keppra 500 mg twice daily Maintain seizure precautions Routine EEG Defer LP -continue asa -not  currently on statin  CAP Reports cough without sputum production.  His RVP is negative.  Chest x-ray shows a developing left upper lobe bronchopneumonia. -Ceftriaxone and azithromycin: 8/24-  HTN Currently normotensive. -holding home losartan 100 mg, hydrochlorothiazide 25 mg    Advance Care Planning:   Code Status: Full Code   Consults: neurology  Family Communication: none at bedside  Severity of Illness: The appropriate patient status for this patient is INPATIENT. Inpatient status is judged to be reasonable and necessary in order to provide the required intensity of service to ensure the patient's safety. The patient's presenting symptoms, physical exam findings, and initial radiographic and laboratory data in the context of their chronic comorbidities is felt to place them at high risk for further clinical deterioration. Furthermore, it is not anticipated that the patient will be medically stable for discharge from the hospital within 2 midnights of admission.   * I certify that at the point of admission it is my clinical judgment that the patient will require inpatient hospital care spanning beyond 2 midnights from the point of admission due to high intensity of service, high risk for further deterioration and high frequency of surveillance required.*  Author: Charolotte Eke, MD 01/21/2023 8:29 AM  For on call review [email or web address].

## 2023-01-21 NOTE — ED Notes (Signed)
CT tech states pt refused his CT scan at this time. Provider has been made aware.
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
## 2023-01-21 NOTE — ED Provider Notes (Signed)
[facility] DEPT [facility] Emergency Department Provider Note MRN:  [id number]  Arrival date & time: 01/21/23     Chief Complaint   Seizures   History of Present Illness   Craig Holden is a 67 y.o. year-old male with a history of stroke, seizure disorder presenting to the ED with chief complaint of seizure.  Patient was sitting on the couch and then his wife went upstairs.  She heard a thud and went downstairs and patient was on the ground rigid, loss of bowel and bladder control.  Was making gurgling sounds.  Called EMS.  Has a seizure disorder but usually just stares into space, rarely has full seizures.  Review of Systems  A thorough review of systems was obtained and all systems are negative except as noted in the HPI and PMH.   Patient's Health History    Past Medical History:  Diagnosis Date   CVA (cerebral infarction)    Depression    Hypertension    Migraine    Nerve damage    PTSD (post-traumatic stress disorder)     Past Surgical History:  Procedure Laterality Date   ANTERIOR CERVICAL DECOMPRESSION/DISCECTOMY FUSION 4 LEVELS  01/20/2015   C4-C7   BACK SURGERY     HEMORROIDECTOMY     HIP SURGERY     RADIOLOGY WITH ANESTHESIA N/A 04/18/2015   Procedure: RADIOLOGY WITH ANESTHESIA;  Surgeon: Julieanne Cotton, MD;  Location: [facility] OR;  Service: Radiology;  Laterality: N/A;    Family History  Problem Relation Age of Onset   Heart attack Father    Emphysema Father    Hypertension Mother    Diabetes Mother     Social History   Socioeconomic History   Marital status: Single    Spouse name: Not on file   Number of children: Not on file   Years of education: Not on file   Highest education level: Not on file  Occupational History   Not on file  Tobacco Use   Smoking status: Every Day    Current packs/day: 1.00    Types: Cigarettes   Smokeless tobacco: Never  Vaping Use   Vaping status: Never Used  Substance and Sexual Activity   Alcohol use:  No   Drug use: Yes    Types: Marijuana    Comment: denies   Sexual activity: Not on file  Other Topics Concern   Not on file  Social History Narrative   Not on file   Social Determinants of Health   Financial Resource Strain: Not on file  Food Insecurity: Not on file  Transportation Needs: Not on file  Physical Activity: Not on file  Stress: Not on file  Social Connections: Not on file  Intimate Partner Violence: Not on file     Physical Exam   Vitals:   01/21/23 0400 01/21/23 0500  BP: (!) 140/97 138/72  Pulse: (!) 110 (!) 107  Resp:    Temp:    SpO2: 94% 96%    CONSTITUTIONAL: Chronically ill-appearing, NAD NEURO/PSYCH: Somnolent, not following commands, withdrawals and performs purposeful movements with noxious stimuli in all extremities EYES:  eyes equal and reactive ENT/NECK:  no LAD, no JVD CARDIO: Regular rate, well-perfused, normal S1 and S2 PULM:  CTAB no wheezing or rhonchi GI/GU:  non-distended, non-tender MSK/SPINE:  No gross deformities, no edema SKIN:  no rash, atraumatic   *Additional and/or pertinent findings included in MDM below  Diagnostic and Interventional Summary    EKG Interpretation Date/Time:  Friday January 20 2023 23:57:51 EDT Ventricular Rate:  95 PR Interval:  188 QRS Duration:  99 QT Interval:  369 QTC Calculation: 464 R Axis:   74  Text Interpretation: Sinus rhythm Biatrial enlargement Probable anteroseptal infarct, old Confirmed by Kennis Carina [phone]) on 01/21/2023 3:12:04 AM       Labs Reviewed  COMPREHENSIVE METABOLIC PANEL - Abnormal; Notable for the following components:      Result Value   Glucose, Bld 109 (*)    BUN 6 (*)    Calcium 8.6 (*)    Albumin 3.2 (*)    All other components within normal limits  SARS CORONAVIRUS 2 BY RT PCR  CBC  PROTIME-INR  ETHANOL  LEVETIRACETAM LEVEL  URINALYSIS, ROUTINE W REFLEX MICROSCOPIC    DG Chest Port 1 View  Final Result    CT HEAD WO CONTRAST ( )  Final Result       Medications  sodium chloride 0.9 % bolus 1,000 mL (0 mLs Intravenous Stopped 01/21/23 0316)  LORazepam (ATIVAN) injection 4 mg (4 mg Intravenous Given 01/21/23 0138)  levETIRAcetam (KEPPRA) IVPB 1000 mg/100 mL premix (0 mg Intravenous Stopped 01/21/23 0345)     Procedures  /  Critical Care Procedures  ED Course and Medical Decision Making  Initial Impression and Ddx Suspect tonic-clonic seizure.  Per documentation had a full tonic-clonic seizure 2 years ago with similar presentation.  Suspect patient is currently postictal.  Vital signs reassuring.  Past medical/surgical history that increases complexity of ED encounter: Seizure disorder, stroke  Interpretation of Diagnostics I personally reviewed the EKG and my interpretation is as follows: Sinus rhythm  Labs overall reassuring with no significant blood count or electrolyte disturbance  Patient Reassessment and Ultimate Disposition/Management     Patient with continued altered mental status, not returning to his baseline.  Neurology consulted, patient has a history of a prolonged postictal state.  Patient also with an oral temperature of 100 and has exhibited some tachycardia here recently.  He has a productive cough, considering underlying infectious process.  No meningismus, doubt meningitis at this time.  Neurology agrees that LP not necessary at this time.  Plan is for hospitalist admission.  Patient management required discussion with the following services or consulting groups:  Hospitalist Service and Neurology  Complexity of Problems Addressed Acute illness or injury that poses threat of life of bodily function  Additional Data Reviewed and Analyzed Further history obtained from: Further history from spouse/family member  Additional Factors Impacting ED Encounter Risk Consideration of hospitalization  Elmer Sow. Pilar Plate, MD [facility] Emergency Medicine [facility] [email or web address]  Final  Clinical Impressions(s) / ED Diagnoses     ICD-10-CM   1. Seizure (HCC)  R56.9       ED Discharge Orders     None        Discharge Instructions Discussed with and Provided to Patient:   Discharge Instructions   None      Sabas Sous, MD 01/21/23 [phone]

## 2023-01-21 NOTE — ED Notes (Signed)
Pt noted to have fecal incontinence. Cleansed and changed patient at this time. Pt noted to still be confused and drowsy.

## 2023-01-22 DIAGNOSIS — R569 Unspecified convulsions: Secondary | ICD-10-CM | POA: Diagnosis not present

## 2023-01-22 LAB — COMPREHENSIVE METABOLIC PANEL
ALT: 12 U/L (ref 0–44)
AST: 23 U/L (ref 15–41)
Albumin: 2.8 g/dL — ABNORMAL LOW (ref 3.5–5.0)
Alkaline Phosphatase: 89 U/L (ref 38–126)
Anion gap: 8 (ref 5–15)
BUN: 8 mg/dL (ref 8–23)
CO2: 22 mmol/L (ref 22–32)
Calcium: 8.2 mg/dL — ABNORMAL LOW (ref 8.9–10.3)
Chloride: 105 mmol/L (ref 98–111)
Creatinine, Ser: 0.98 mg/dL (ref 0.61–1.24)
GFR, Estimated: >60 mL/min (ref >60)
Glucose, Bld: 80 mg/dL (ref 70–99)
Potassium: 3.8 mmol/L (ref 3.5–5.1)
Sodium: 135 mmol/L (ref 135–145)
Total Bilirubin: 1.3 mg/dL — ABNORMAL HIGH (ref 0.3–1.2)
Total Protein: 5.6 g/dL — ABNORMAL LOW (ref 6.5–8.1)

## 2023-01-22 LAB — CBC
HCT: 34.4 % — ABNORMAL LOW (ref 39.0–52.0)
Hemoglobin: 12.2 g/dL — ABNORMAL LOW (ref 13.0–17.0)
MCH: 32.8 pg (ref 26.0–34.0)
MCHC: 35.5 g/dL (ref 30.0–36.0)
MCV: 92.5 fL (ref 80.0–100.0)
Platelets: 179 10*3/uL (ref 150–400)
RBC: 3.72 MIL/uL — ABNORMAL LOW (ref 4.22–5.81)
RDW: 13.8 % (ref 11.5–15.5)
WBC: 7.7 10*3/uL (ref 4.0–10.5)
nRBC: 0 % (ref 0.0–0.2)

## 2023-01-22 LAB — PROCALCITONIN: Procalcitonin: <0.1 ng/mL

## 2023-01-22 LAB — STREP PNEUMONIAE URINARY ANTIGEN: Strep Pneumo Urinary Antigen: NEGATIVE

## 2023-01-22 MED ORDER — LOSARTAN POTASSIUM 50 MG PO TABS
50.0000 mg | ORAL_TABLET | Freq: Every day | ORAL | Status: DC
  Administered 2023-01-22 – 2023-01-23 (×2): 50 mg via ORAL
  Filled 2023-01-22 (×2): qty 1

## 2023-01-22 MED ORDER — ALBUTEROL SULFATE HFA 108 (90 BASE) MCG/ACT IN AERS: 1.0000 | INHALATION_SPRAY | Freq: Four times a day (QID) | RESPIRATORY_TRACT | Status: DC | PRN

## 2023-01-22 MED ORDER — AZITHROMYCIN 500 MG PO TABS
500.0000 mg | ORAL_TABLET | Freq: Every day | ORAL | Status: AC
  Administered 2023-01-23: 500 mg via ORAL
  Filled 2023-01-22: qty 1

## 2023-01-22 MED ORDER — ALBUTEROL SULFATE (2.5 MG/3ML) 0.083% IN NEBU: 2.5000 mg | INHALATION_SOLUTION | Freq: Four times a day (QID) | RESPIRATORY_TRACT | Status: DC | PRN

## 2023-01-22 MED ORDER — SODIUM CHLORIDE 0.9 % IV SOLN
2.0000 g | Freq: Every day | INTRAVENOUS | Status: DC
  Administered 2023-01-22 – 2023-01-23 (×2): 2 g via INTRAVENOUS
  Filled 2023-01-22 (×2): qty 20

## 2023-01-22 NOTE — Hospital Course (Addendum)
v19 y.o. 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 Etiology non-compliance vs. PNA. In the ED: Vitals stable afebrile.  Mild hypokalemia stable LFTs LDL 88 procalcitonin normal CBC stable UA with pyuria 21-50, moderate LE and nitrite negative UDS positive for benzo and THC CT head unremarkable, chest x-ray: Apparent surgical bronchitis with developing left upper lobe bronchopneumonia.Patient was loaded with Keppra placed on ceftriaxone azithromycin, neurology was consulted and admitted. Seen by neurology, CT head no acute finding, EEG 8/24 within normal limits, treating for breakthrough seizure question medication noncompliance versus likely lowering of seizure threshold due to acute illness> advised to transition from IV to  1000 mg XR form. Patient remains afebrile respiratory status stable> and is stable for discharge home on oral antibiotics with driving restriction which has been discussed

## 2023-01-22 NOTE — Plan of Care (Signed)
  Problem: Education: Goal: Knowledge of General Education information will improve Description: Including pain rating scale, medication(s)/side effects and non-pharmacologic comfort measures Outcome: Progressing   Problem: Clinical Measurements: Goal: Respiratory complications will improve Outcome: Progressing Goal: Cardiovascular complication will be avoided Outcome: Progressing   Problem: Activity: Goal: Risk for activity intolerance will decrease Outcome: Progressing   Problem: Coping: Goal: Level of anxiety will decrease Outcome: Progressing   Problem: Elimination: Goal: Will not experience complications related to urinary retention Outcome: Progressing   Problem: Skin Integrity: Goal: Risk for impaired skin integrity will decrease Outcome: Progressing

## 2023-01-22 NOTE — Progress Notes (Addendum)
Neurology Progress Note   S:// Patient is sitting up in bed eating breakfast in NAD.  Asking to go home. He states he has not missed any doses of his home medications  Routine EEG was within normal limits No new neurological events overnight. He states he is back to baseline.   O:// Current vital signs: BP (!) 158/93 (BP Location: Left Arm)   Pulse 86   Temp 98.6 F (37 C) (Oral)   Resp 16   Wt 81.6 kg   SpO2 100%   BMI 23.41 kg/m  Vital signs in last 24 hours: Temp:  [97.5 F (36.4 C)-99.5 F (37.5 C)] 98.6 F (37 C) (08/25 0819) Pulse Rate:  [80-96] 86 (08/25 0819) Resp:  [16-18] 16 (08/25 0819) BP: (132-158)/(69-96) 158/93 (08/25 0819) SpO2:  [96 %-100 %] 100 % (08/25 0819)  GENERAL: Awake, alert in NAD HEENT: - Normocephalic and atraumatic, dry mm LUNGS - Clear to auscultation bilaterally with no wheezes CV - S1S2 RRR, no m/r/g, equal pulses bilaterally. ABDOMEN - Soft, nontender, nondistended with normoactive BS Ext: warm, well perfused, intact peripheral pulses, no edema  NEURO:  Mental Status: AA&Ox3  Language: speech is clear.  Naming, repetition, fluency, and comprehension intact. Cranial Nerves: PERRL  EOMI, visual fields full, no facial asymmetry, facial sensation intact, hearing intact, tongue/uvula/soft palate midline, normal sternocleidomastoid and trapezius muscle strength. No evidence of tongue atrophy  Motor: 5/5 in all 4 extremities  Tone is normal and bulk is normal Sensation- Intact to light touch bilaterally Coordination: FTN intact bilaterally, no ataxia in BLE. Gait- Deferred  Medications  Current Facility-Administered Medications:    acetaminophen (TYLENOL) tablet 650 mg, 650 mg, Oral, Q6H PRN **OR** acetaminophen (TYLENOL) suppository 650 mg, 650 mg, Rectal, Q6H PRN, Howerter, Justin B, DO   albuterol (PROVENTIL) (2.5 MG/3ML) 0.083% nebulizer solution 2.5 mg, 2.5 mg, Nebulization, Q6H PRN, Mamie Laurel, RPH   aspirin EC tablet 81 mg,  81 mg, Oral, Daily, Marin Olp C, MD, 81 mg at 01/22/23 0921   azithromycin (ZITHROMAX) 500 mg in sodium chloride 0.9 % 250 mL IVPB, 500 mg, Intravenous, Q24H, Marin Olp C, MD, Last Rate: 250 mL/hr at 01/22/23 0926, 500 mg at 01/22/23 0926   cefTRIAXone (ROCEPHIN) 2 g in sodium chloride 0.9 % 100 mL IVPB, 2 g, Intravenous, Daily, Kc, Ramesh, MD, Last Rate: 200 mL/hr at 01/22/23 0839, 2 g at 01/22/23 0839   levETIRAcetam (KEPPRA) IVPB 500 mg/100 mL premix, 500 mg, Intravenous, Q12H, Marin Olp C, MD, Last Rate: 400 mL/hr at 01/21/23 2321, 500 mg at 01/21/23 2321   losartan (COZAAR) tablet 50 mg, 50 mg, Oral, Daily, Kc, Ramesh, MD, 50 mg at 01/22/23 0921   ondansetron (ZOFRAN) injection 4 mg, 4 mg, Intravenous, Q6H PRN, Angie Fava, DO  Labs CBC    Component Value Date/Time   WBC 7.7 01/22/2023 0415   RBC 3.72 (L) 01/22/2023 0415   HGB 12.2 (L) 01/22/2023 0415   HCT 34.4 (L) 01/22/2023 0415   PLT 179 01/22/2023 0415   MCV 92.5 01/22/2023 0415   MCH 32.8 01/22/2023 0415   MCHC 35.5 01/22/2023 0415   RDW 13.8 01/22/2023 0415   LYMPHSABS 1.7 08/31/2021 2031   MONOABS 1.0 08/31/2021 2031   EOSABS 0.0 08/31/2021 2031   BASOSABS 0.0 08/31/2021 2031    CMP     Component Value Date/Time   NA 135 01/22/2023 0415   K 3.8 01/22/2023 0415   CL 105 01/22/2023 0415   CO2 22  01/22/2023 0415   GLUCOSE 80 01/22/2023 0415   BUN 8 01/22/2023 0415   CREATININE 0.98 01/22/2023 0415   CALCIUM 8.2 (L) 01/22/2023 0415   PROT 5.6 (L) 01/22/2023 0415   ALBUMIN 2.8 (L) 01/22/2023 0415   AST 23 01/22/2023 0415   ALT 12 01/22/2023 0415   ALKPHOS 89 01/22/2023 0415   BILITOT 1.3 (H) 01/22/2023 0415   GFRNONAA >60 01/22/2023 0415   GFRAA >60 11/16/2017 1750    Lipid Panel     Component Value Date/Time   CHOL 133 01/21/2023 0926   TRIG 48 01/21/2023 0926   HDL 35 (L) 01/21/2023 0926   CHOLHDL 3.8 01/21/2023 0926   VLDL 10 01/21/2023 0926   LDLCALC 88 01/21/2023 0926    Lab  Results  Component Value Date   HGBA1C 4.9 08/02/2021     Imaging I have reviewed images in epic and the results pertinent to this consultation are:  CT-scan of the brain no acute process   EEG 8/24: This study is within normal limits. The excessive beta activity seen in the background is most likely due to the effect of benzodiazepine and is a benign EEG pattern. No seizures or epileptiform discharges were seen throughout the recording.   Assessment: 67 y.o. male with a past medical history of PTSD, migraines, hypertension, left MCA stroke and treated left ICA occlusion status post stenting without much in terms of residual deficits, who has been seen in the past for breakthrough seizures in the setting of noncompliance to antiepileptics. He was brought in on 8/23 for evaluation of breakthrough seizure. Family reported that he was found posturing and unresponsive for about 1 minute with urinary and fecal incontinence.  He remained confused on arrival to the ED, which was several hours after the seizure at home  - Neurological exam today is nonfocal - No further seizures - CT head: No acute process  - EEG 8/24: This study is within normal limits. The excessive beta activity seen in the background is most likely due to the effect of benzodiazepine and is a benign EEG pattern. No seizures or epileptiform discharges were seen throughout the recording.   Impression: Breakthrough seizure. Question medication noncompliance versus likely lowering of seizure threshold in the setting of acute illness.   Recommendations: - Seizure precautions - Continue home Keppra. Prior to discharge, will need to switch from inpatient IV dosing of 500 mg BID to outpatient extended release once per day oral dosing of 1000 mg Keppra XR po QD - Treatment of pneumonia per primary team  - Will need outpatient follow up with Neurology after discharge  - Neurohospitalist service will sign off. Please call with questions or  concerns   Gevena Mart DNP, ACNPC-AG  [facility]  SEIZURE PRECAUTIONS Per [facility] statutes, patients with seizures are not allowed to drive until they have been seizure-free for six months. Use caution when using heavy equipment or power tools. Avoid working on ladders or at heights. Take showers instead of baths. Ensure the water temperature is not too high on the home water heater. Do not go swimming alone. Do not lock yourself in a room alone (i.e. bathroom). When caring for infants or small children, sit down when holding, feeding, or changing them to minimize risk of injury to the child in the event you have a seizure. Maintain good sleep hygiene. Avoid alcohol.    If patient has another seizure, call 911 and bring them back to the ED if: A.  The seizure lasts longer than 5 minutes.      B.  The patient doesn't wake shortly after the seizure or has new problems such as difficulty seeing, speaking or moving following the seizure C.  The patient was injured during the seizure D.  The patient has a temperature over 102 F (39C) E.  The patient vomited during the seizure and now is having trouble breathing   Electronically signed: Dr. Caryl Pina

## 2023-01-22 NOTE — Progress Notes (Signed)
PROGRESS NOTE Craig Holden  WJX:[id number] DOB: [birth year] DOA: 01/20/2023 PCP: Fleet Contras, MD  Brief Narrative/Hospital Course: v77 y.o. 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 Etiology non-compliance vs. PNA. In the ED: Vitals stable afebrile.  Mild hypokalemia stable LFTs LDL 88 procalcitonin normal CBC stable UA with pyuria 21-50, moderate LE and nitrite negative UDS positive for benzo and THC CT head unremarkable, chest x-ray: Apparent surgical bronchitis with developing left upper lobe bronchopneumonia Patient was loaded with Keppra placed on ceftriaxone azithromycin, neurology was consulted and admitted   Subjective: Patient seen and examined this morning He is alert awake resting comfortably Reports she has an appointment at the [facility] tomorrow, per his request [facility] consulted for SW to [facility] That he is currently hospitalized so that it will be postponed   Assessment and Plan: Principal Problem:   Seizure (HCC)   Breakthrough seizure Postictal state: ?  Medication noncompliance versus lowering of seizure threshold due to acute illness.  Patient encephalopathic confused in the ED in the setting of postictal state and benzodiazepine. Neurology following. PT and Keppra 1 g extended release form for compliance.  Continue Keppra 500 twice daily, seizure precaution, treatment of underlying infection.  EEG within normal limit.  Currently he is alert awake oriented x 3 Instructed that he cannot drive until his seizure-free for 6 months per  DMV  Community-acquired pneumonia on left upper lobe: Continue ceftriaxone, azithromycin.  RVP is negative.  Check urine antigen for Legionella and Streptococcus  Hypertension: BP uptrending,  resume losartan continue to hold hydrochlorothiazide for now.  Hypokalemia: Resolved  Hypoalbuminemia: Augment nutritional status  UDS positive for benzo and THC likely in the past.counselling.  Diarrhea: Reflex C. difficile ordered  DVT prophylaxis: SCDs Start: 01/21/23 2130 Code Status:   Code Status: Full Code Family Communication: plan of care discussed with patient at bedside. Patient status is: Inpatient because of seizure Level of care: Med-Surg   Dispo: The patient is from: Home            Anticipated disposition: Home in next 1 to 2 days  Objective: Vitals last 24 hrs: Vitals:   01/21/23 2011 01/21/23 2345 01/22/23 0357 01/22/23 0819  BP: (!) 151/83 (!) 150/85 (!) 157/96 (!) 158/93  Pulse: 81 86 85 86  Resp: 18 18 17 16   Temp: 98 F (36.7 C) 98.6 F (37 C) (!) 97.5 F (36.4 C) 98.6 F (37 C)  TempSrc:  Oral  Oral  SpO2: 97% 100% 96% 100%  Weight:       Weight change:   Physical Examination: General exam: alert awake, older than stated age HEENT:Oral mucosa moist, Ear/Nose WNL grossly Respiratory system: bilaterally CLEAR BS, no use of accessory muscle Cardiovascular system: S1 & S2 +, No JVD. Gastrointestinal system: Abdomen soft,NT,ND, BS+ Nervous System:Alert, awake, moving extremities. Extremities: LE edema neg,distal peripheral pulses palpable.  Skin: No rashes,no icterus. MSK: Normal muscle bulk,tone, power  Medications reviewed:  Scheduled Meds:  aspirin EC  81 mg Oral Daily   losartan  50 mg Oral Daily   Continuous Infusions:  azithromycin 500 mg (01/22/23 0926)   cefTRIAXone (ROCEPHIN)  IV 2 g (01/22/23 0839)   levETIRAcetam 500 mg (01/21/23 2321)      Diet Order  Diet regular Room service appropriate? No; Fluid consistency: Thin  Diet effective now                  Intake/Output Summary (Last 24 hours) at 01/22/2023 1027 Last data filed at 01/22/2023 0700 Gross per 24 hour  Intake 1295.28 ml  Output 850 ml  Net  445.28 ml   Net IO Since Admission: 445.28 mL [01/22/23 1027]  Wt Readings from Last 3 Encounters:  01/20/23 81.6 kg  08/31/21 81.6 kg  08/01/21 81.6 kg     Unresulted Labs (From admission, onward)     Start     Ordered   01/22/23 0839  Strep pneumoniae urinary antigen  Once,   R        01/22/23 0838   01/22/23 0839  Legionella Pneumophila Serogp 1 Ur Ag  Once,   R        01/22/23 0838   01/22/23 0727  C Difficile Quick Screen w PCR reflex  (C Difficile quick screen w PCR reflex panel )  Once, for 24 hours,   TIMED       References:    CDiff Information Tool   01/22/23 0726   01/20/23 2340  Levetiracetam level  Once,   URGENT        01/20/23 2339          Data Reviewed: I have personally reviewed following labs and imaging studies CBC: Recent Labs  Lab 01/20/23 2344 01/21/23 0926 01/22/23 0415  WBC 10.1 10.1 7.7  HGB 13.7 12.9* 12.2*  HCT 40.5 36.3* 34.4*  MCV 94.0 93.3 92.5  PLT 197 185 179   Basic Metabolic Panel: Recent Labs  Lab 01/20/23 2344 01/21/23 0926 01/22/23 0415  NA 137 138 135  K 3.7 3.3* 3.8  CL 103 105 105  CO2 23 24 22   GLUCOSE 109* 99 80  BUN 6* 6* 8  CREATININE 0.89 0.97 0.98  CALCIUM 8.6* 8.2* 8.2*  MG 1.8 1.7  --    GFR: CrCl cannot be calculated (Unknown ideal weight.). Liver Function Tests: Recent Labs  Lab 01/20/23 2344 01/21/23 0926 01/22/23 0415  AST 20 20 23   ALT 12 13 12   ALKPHOS 103 100 89  BILITOT 0.5 1.0 1.3*  PROT 6.6 6.1* 5.6*  ALBUMIN 3.2* 3.0* 2.8*   Recent Labs  Lab 01/20/23 2344  INR 1.1    Lipid Profile: Recent Labs    01/21/23 0926  CHOL 133  HDL 35*  LDLCALC 88  TRIG 48  CHOLHDL 3.8   Thyroid Function Tests: Recent Labs    01/21/23 0926  TSH 1.029   Sepsis Labs: Recent Labs  Lab 01/21/23 0926 01/22/23 0415  PROCALCITON <0.10 <0.10    Recent Results (from the past 240 hour(s))  SARS Coronavirus 2 by RT PCR (hospital order, performed in [facility] hospital lab) *cepheid single  result test* Anterior Nasal Swab     Status: None   Collection Time: 01/21/23  5:57 AM   Specimen: Anterior Nasal Swab  Result Value Ref Range Status   SARS Coronavirus 2 by RT PCR NEGATIVE NEGATIVE Final    Comment: Performed at [facility], [address], [place], [place] [address]  MRSA Next Gen by PCR, Nasal     Status: None   Collection Time: 01/21/23  7:30 AM   Specimen: Nasal Mucosa; Nasal Swab  Result Value Ref Range Status   MRSA by PCR Next Gen NOT DETECTED NOT DETECTED Final  Comment: (NOTE) The GeneXpert MRSA Assay (FDA approved for NASAL specimens only), is one component of a comprehensive MRSA colonization surveillance program. It is not intended to diagnose MRSA infection nor to guide or monitor treatment for MRSA infections. Test performance is not FDA approved in patients less than 85 years old. Performed at [facility], [address], [place], [place] [address]     Antimicrobials: Anti-infectives (From admission, onward)    Start     Dose/Rate Route Frequency Ordered Stop   01/22/23 0900  cefTRIAXone (ROCEPHIN) 2 g in sodium chloride 0.9 % 100 mL IVPB        2 g 200 mL/hr over 30 Minutes Intravenous Daily 01/22/23 0726 01/26/23 0959   01/21/23 0800  cefTRIAXone (ROCEPHIN) 2 g in sodium chloride 0.9 % 100 mL IVPB  Status:  Discontinued        2 g 200 mL/hr over 30 Minutes Intravenous Every 24 hours 01/21/23 0730 01/22/23 0726   01/21/23 0800  azithromycin (ZITHROMAX) 500 mg in sodium chloride 0.9 % 250 mL IVPB        500 mg 250 mL/hr over 60 Minutes Intravenous Every 24 hours 01/21/23 0730 01/24/23 0759      Culture/Microbiology    Component Value Date/Time   SDES URINE, CLEAN CATCH 08/02/2021 0255   SPECREQUEST NONE 08/02/2021 0255   CULT  08/02/2021 0255    NO GROWTH Performed at [facility], [address] [place], [place] [address]    REPTSTATUS 08/03/2021 FINAL 08/02/2021 0255   Radiology Studies: EEG adult  Result  Date: 01/21/2023 Charlsie Quest, MD     01/21/2023  3:31 PM Patient Name: Craig Holden MRN: [id number] Epilepsy Attending: Charlsie Quest Referring Physician/Provider: Charolotte Eke, MD Date: 01/21/2024 Duration: 23.11 mins Patient history: 67 year old man past history of PTSD, migraines, hypertension, left MCA stroke secondary to left ICA occlusion status post stenting without much in terms of residual deficits, seizures, noncompliance to medication and prior episodes of breakthrough seizures in the setting of medication noncompliance brought in for breakthrough seizure at home later last evening. EEG to evaluate for seizure Level of alertness: Awake AEDs during EEG study: LEV Technical aspects: This EEG study was done with scalp electrodes positioned according to the 10-20 International system of electrode placement. Electrical activity was reviewed with band pass filter of 1-70Hz , sensitivity of 7 uV/mm, display speed of 11mm/sec with a 60Hz  notched filter applied as appropriate. EEG data were recorded continuously and digitally stored.  Video monitoring was available and reviewed as appropriate. Description: The posterior dominant rhythm consists of 8-9 Hz activity of moderate voltage (25-35 uV) seen predominantly in posterior head regions, symmetric and reactive to eye opening and eye closing. Sleep was characterized by vertex waves, sleep spindles (12 to 14 Hz), maximal frontocentral region. There is an excessive amount of 15 to 18 Hz beta activity distributed symmetrically and diffusely.  Hyperventilation and photic stimulation were not performed.   ABNORMALITY - Excessive beta, generalized IMPRESSION: This study is within normal limits. The excessive beta activity seen in the background is most likely due to the effect of benzodiazepine and is a benign EEG pattern. No seizures or epileptiform discharges were seen throughout the recording. A normal interictal EEG does not exclude the diagnosis of  epilepsy. Charlsie Quest   DG Chest Port 1 View  Result Date: 01/21/2023 CLINICAL DATA:  67 year old male with history of fever. Possible seizure at home. EXAM: PORTABLE CHEST 1  VIEW COMPARISON:  Chest x-ray 08/31/2021. FINDINGS: Lung volumes are normal. Patchy areas of interstitial prominence, peribronchial cuffing and ill-defined airspace consolidation are noted, most evident in the left upper lobe. Lower lungs appear well aerated. No pleural effusions. No pneumothorax. No evidence of pulmonary edema. Heart size is normal. Upper mediastinal contours are within normal limits. IMPRESSION: 1. The appearance of the chest is suggestive of bronchitis with developing left upper lobe bronchopneumonia. Followup PA and lateral chest X-ray is recommended in 3-4 weeks following trial of antibiotic therapy to ensure resolution and exclude underlying malignancy. Electronically Signed   By: Trudie Reed M.D.   On: 01/21/2023 06:00   CT HEAD WO CONTRAST ( )  Result Date: 01/21/2023 CLINICAL DATA:  Recent seizure disorder EXAM: CT HEAD WITHOUT CONTRAST TECHNIQUE: Contiguous axial images were obtained from the base of the skull through the vertex without intravenous contrast. RADIATION DOSE REDUCTION: This exam was performed according to the departmental dose-optimization program which includes automated exposure control, adjustment of the mA and/or kV according to patient size and/or use of iterative reconstruction technique. COMPARISON:  08/31/2021 FINDINGS: Brain: No evidence of acute infarction, hemorrhage, hydrocephalus, extra-axial collection or mass lesion/mass effect. There are changes consistent with prior left MCA infarct. Vascular: Prominent dural vein is noted on the right stable from multiple previous exams dating back to 2022. Skull: Normal. Negative for fracture or focal lesion. Sinuses/Orbits: No acute finding. Other: None. IMPRESSION: No acute intracranial abnormality noted. Electronically Signed    By: Alcide Clever M.D.   On: 01/21/2023 02:06     LOS: 1 day   Lanae Boast, MD [facility]  01/22/2023, 10:27 AM

## 2023-01-22 NOTE — Progress Notes (Signed)
This patient is receiving the antibiotic azithromycin by the intravenous route.   Based on criteria approved by the Pharmacy and Therapeutics Committee, and the  Infectious Disease Division, the antibiotic(s) is / are being converted to equivalent oral dose form(s). These criteria include:  Patient being treated for a respiratory tract infection, urinary tract infection, cellulitis, or Clostridium Difficile associated diarrhea  The patient is not neutropenic and does not exhibit a GI malabsorption state  The patient is eating (either orally or per tube) and/or has been taking other orally administered medications for at least 24 hours.  The patient is improving clinically (physician assessment and a 24-hour Tmax of 100.5 F).   If you have questions about this conversion, please contact the pharmacy department.   Thank you for allowing pharmacy to be a part of this patient's care.  Romie Minus, PharmD PGY1 Pharmacy Resident  Please check AMION for all [facility] phone numbers After 10:00 PM, call Main Pharmacy [phone]

## 2023-01-23 ENCOUNTER — Other Ambulatory Visit (HOSPITAL_COMMUNITY): Payer: Self-pay

## 2023-01-23 DIAGNOSIS — R569 Unspecified convulsions: Secondary | ICD-10-CM | POA: Diagnosis not present

## 2023-01-23 LAB — LEVETIRACETAM LEVEL: Levetiracetam Lvl: 6.7 ug/mL — ABNORMAL LOW (ref 10.0–40.0)

## 2023-01-23 MED ORDER — CEFUROXIME AXETIL 500 MG PO TABS
500.0000 mg | ORAL_TABLET | Freq: Two times a day (BID) | ORAL | 0 refills | Status: DC
  Filled 2023-01-23 (×2): qty 8, 4d supply, fill #0

## 2023-01-23 MED ORDER — AZITHROMYCIN 500 MG PO TABS: 500.0000 mg | ORAL_TABLET | Freq: Every day | ORAL | 0 refills | Status: AC

## 2023-01-23 MED ORDER — LEVETIRACETAM ER 500 MG PO TB24
1000.0000 mg | ORAL_TABLET | Freq: Every day | ORAL | 0 refills | Status: DC
  Filled 2023-01-23 (×2): qty 60, 30d supply, fill #0

## 2023-01-23 MED ORDER — AZITHROMYCIN 500 MG PO TABS
500.0000 mg | ORAL_TABLET | Freq: Every day | ORAL | 0 refills | Status: DC
  Filled 2023-01-23 (×2): qty 1, 1d supply, fill #0

## 2023-01-23 MED ORDER — LEVETIRACETAM ER 500 MG PO TB24: 1000.0000 mg | ORAL_TABLET | Freq: Every day | ORAL | 0 refills | Status: AC

## 2023-01-23 MED ORDER — LEVETIRACETAM ER 500 MG PO TB24
1000.0000 mg | ORAL_TABLET | Freq: Every day | ORAL | Status: DC
  Administered 2023-01-23: 1000 mg via ORAL
  Filled 2023-01-23: qty 2

## 2023-01-23 MED ORDER — CEFUROXIME AXETIL 500 MG PO TABS: 500.0000 mg | ORAL_TABLET | Freq: Two times a day (BID) | ORAL | 0 refills | Status: AC

## 2023-01-23 NOTE — Plan of Care (Signed)
  Problem: Pain Managment: Goal: General experience of comfort will improve Outcome: Progressing   Problem: Safety: Goal: Ability to remain free from injury will improve Outcome: Progressing   Problem: Skin Integrity: Goal: Risk for impaired skin integrity will decrease Outcome: Progressing   

## 2023-01-23 NOTE — Discharge Summary (Signed)
Physician Discharge Summary  Craig Holden ZOX:[id number] DOB: 1956/03/28 DOA: 01/20/2023  PCP: Fleet Contras, MD  Admit date: 01/20/2023 Discharge date: 01/23/2023 Recommendations for Outpatient Follow-up:  Follow up with PCP in 1 weeks-call for appointment Please obtain BMP/CBC in one week  Discharge Dispo: home Discharge Condition: Stable Code Status:   Code Status: Full Code Diet recommendation:  Diet Order             Diet regular Room service appropriate? No; Fluid consistency: Thin  Diet effective now                    Brief/Interim Summary: v67 y.o. 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 Etiology non-compliance vs. PNA. In the ED: Vitals stable afebrile.  Mild hypokalemia stable LFTs LDL 88 procalcitonin normal CBC stable UA with pyuria 21-50, moderate LE and nitrite negative UDS positive for benzo and THC CT head unremarkable, chest x-ray: Apparent surgical bronchitis with developing left upper lobe bronchopneumonia.Patient was loaded with Keppra placed on ceftriaxone azithromycin, neurology was consulted and admitted. Seen by neurology, CT head no acute finding, EEG 8/24 within normal limits, treating for breakthrough seizure question medication noncompliance versus likely lowering of seizure threshold due to acute illness> advised to transition from IV to  1000 mg XR form. Patient remains afebrile respiratory status stable> and is stable for discharge home on oral antibiotics with driving restriction which has been discussed    Discharge Diagnoses:  Principal Problem:   Seizure (HCC)  Breakthrough seizure Postictal state: ?Medication noncompliance versus lowering of seizure threshold due to acute illness.  Patient  encephalopathic confused in the ED in the setting of postictal state and benzodiazepine. Neurology following.EEG within normal limit.  Currently he is alert awake oriented x 3. Advised to transition from IV to  1000 mg XR form and sent new rx. Instructed that he cannot drive until his seizure-free for 6 months per [place] DMV   Community-acquired pneumonia on left upper lobe: Transitioned to oral antibiotics, stable not hypoxic    Hypertension: BP stable continue home meds    Hypokalemia: Resolved   Hypoalbuminemia: Augment nutritional status   UDS positive for benzo and THC likely in the past.counselling.   Diarrhea: No recurrence.    Consults: Neurology Subjective: Alert awake oriented resting comfortably on the eval, requesting for discharge   Discharge Exam: Vitals:   01/23/23 0340 01/23/23 0754  BP: (!) 170/87 (!) 165/83  Pulse: 93 82  Resp: 16 18  Temp: 98.8 F (37.1 C) 98.3 F (36.8 C)  SpO2: 100% 100%   General: Pt is alert, awake, not in acute distress Cardiovascular: RRR, S1/S2 +, no rubs, no gallops Respiratory: CTA bilaterally, no wheezing, no rhonchi Abdominal: Soft, NT, ND, bowel sounds + Extremities: no edema, no cyanosis  Discharge Instructions  Discharge Instructions     Discharge instructions   Complete by: As directed    Seizure precautions: Per [facility] statutes, patients with seizures are not allowed to drive until they have been seizure-free for six months and cleared by a physician    Use caution when using heavy equipment or power tools. Avoid working on ladders or at heights. Take showers instead of baths. Ensure the water temperature is not too high on the home water heater. Do not  go swimming alone. Do not lock yourself in a room alone (i.e. bathroom). When caring for infants or small children, sit down when holding, feeding, or changing them to minimize risk of injury to the child in the event you have a seizure. Maintain good sleep  hygiene. Avoid alcohol.    If patient has another seizure, call 911 and bring them back to the ED if: A.  The seizure lasts longer than 5 minutes.      B.  The patient doesn't wake shortly after the seizure or has new problems such as difficulty seeing, speaking or moving following the seizure C.  The patient was injured during the seizure D.  The patient has a temperature over 102 F (39C) E.  The patient vomited during the seizure and now is having trouble breathing    During the Seizure   - First, ensure adequate ventilation and place patients on the floor on their left side  Loosen clothing around the neck and ensure the airway is patent. If the patient is clenching the teeth, do not force the mouth open with any object as this can cause severe damage - Remove all items from the surrounding that can be hazardous. The patient may be oblivious to what's happening and may not even know what he or she is doing. If the patient is confused and wandering, either gently guide him/her away and block access to outside areas - Reassure the individual and be comforting - Call 911. In most cases, the seizure ends before EMS arrives. However, there are cases when seizures may last over 3 to 5 minutes. Or the individual may have developed breathing difficulties or severe injuries. If a pregnant patient or a person with diabetes develops a seizure, it is prudent to call an ambulance. - Finally, if the patient does not regain full consciousness, then call EMS. Most patients will remain confused for about 45 to 90 minutes after a seizure, so you must use judgment in calling for help.    After the Seizure (Postictal Stage)   After a seizure, most patients experience confusion, fatigue, muscle pain and/or a headache. Thus, one should permit the individual to sleep. For the next few days, reassurance is essential. Being calm and helping reorient the person is also of importance.   Most seizures are painless and  end spontaneously. Seizures are not harmful to others but can lead to complications such as stress on the lungs, brain and the heart. Individuals with prior lung problems may develop labored breathing and respiratory distress.     Please call call MD or return to ER for similar or worsening recurring problem that brought you to hospital or if any fever,nausea/vomiting,abdominal pain, uncontrolled pain, chest pain,  shortness of breath or any other alarming symptoms.  Please follow-up your doctor as instructed in a week time and call the office for appointment.  Please avoid alcohol, smoking, or any other illicit substance and maintain healthy habits including taking your regular medications as prescribed.  You were cared for by a hospitalist during your hospital stay. If you have any questions about your discharge medications or the care you received while you were in the hospital after you are discharged, you can call the unit and ask to speak with the hospitalist on call if the hospitalist that took care of you is not available.  Once you are discharged, your primary care physician will handle any further medical issues. Please note that NO REFILLS for any discharge medications  will be authorized once you are discharged, as it is imperative that you return to your primary care physician (or establish a relationship with a primary care physician if you do not have one) for your aftercare needs so that they can reassess your need for medications and monitor your lab values   Increase activity slowly   Complete by: As directed       Allergies as of 01/23/2023       Reactions   Morphine Sulfate Nausea And Vomiting   Sertraline Nausea And Vomiting        Medication List     STOP taking these medications    levETIRAcetam 500 MG tablet Commonly known as: KEPPRA Replaced by: levETIRAcetam ER 1000 MG Tb24       TAKE these medications    albuterol 108 (90 Base) MCG/ACT  inhaler Commonly known as: VENTOLIN HFA Inhale 1-2 puffs into the lungs every 6 (six) hours as needed for wheezing.   aspirin EC 81 MG tablet Take 81 mg by mouth daily. Swallow whole.   azithromycin 500 MG tablet Commonly known as: ZITHROMAX Take 1 tablet (500 mg total) by mouth daily for 1 day.   cefUROXime 500 MG tablet Commonly known as: CEFTIN Take 1 tablet (500 mg total) by mouth 2 (two) times daily with a meal for 4 days.   hydrochlorothiazide 25 MG tablet Commonly known as: HYDRODIURIL Take 12.5 mg by mouth daily.   levETIRAcetam ER 1000 MG Tb24 Take 1,000 mg by mouth daily. Replaces: levETIRAcetam 500 MG tablet   losartan 100 MG tablet Commonly known as: COZAAR Take 50 mg by mouth daily.   sildenafil 100 MG tablet Commonly known as: VIAGRA Take 100 mg by mouth daily as needed for erectile dysfunction.        Follow-up Information     Fleet Contras, MD Follow up in 1 week(s).   Specialty: Internal Medicine Contact information: [address] [place] [address] [phone]                Allergies  Allergen Reactions   Morphine Sulfate Nausea And Vomiting   Sertraline Nausea And Vomiting    The results of significant diagnostics from this hospitalization (including imaging, microbiology, ancillary and laboratory) are listed below for reference.    Microbiology: Recent Results (from the past 240 hour(s))  SARS Coronavirus 2 by RT PCR (hospital order, performed in [facility] hospital lab) *cepheid single result test* Anterior Nasal Swab     Status: None   Collection Time: 01/21/23  5:57 AM   Specimen: Anterior Nasal Swab  Result Value Ref Range Status   SARS Coronavirus 2 by RT PCR NEGATIVE NEGATIVE Final    Comment: Performed at [facility], [address] [place], [place] [address]  MRSA Next Gen by PCR, Nasal     Status: None   Collection Time: 01/21/23  7:30 AM   Specimen: Nasal Mucosa; Nasal Swab  Result Value Ref Range  Status   MRSA by PCR Next Gen NOT DETECTED NOT DETECTED Final    Comment: (NOTE) The GeneXpert MRSA Assay (FDA approved for NASAL specimens only), is one component of a comprehensive MRSA colonization surveillance program. It is not intended to diagnose MRSA infection nor to guide or monitor treatment for MRSA infections. Test performance is not FDA approved in patients less than 29 years old. Performed at [facility], [address], [place], [place] [address]     Procedures/Studies: EEG adult  Result Date: 01/21/2023 Charlsie Quest, MD     01/21/2023  3:31 PM Patient Name: WILMAN SCALA MRN: [id number] Epilepsy Attending: Charlsie Quest Referring Physician/Provider: Charolotte Eke, MD Date: 01/21/2024 Duration: 23.11 mins Patient history: 67 year old man past history of PTSD, migraines, hypertension, left MCA stroke secondary to left ICA occlusion status post stenting without much in terms of residual deficits, seizures, noncompliance to medication and prior episodes of breakthrough seizures in the setting of medication noncompliance brought in for breakthrough seizure at home later last evening. EEG to evaluate for seizure Level of alertness: Awake AEDs during EEG study: LEV Technical aspects: This EEG study was done with scalp electrodes positioned according to the 10-20 International system of electrode placement. Electrical activity was reviewed with band pass filter of 1-70Hz , sensitivity of 7 uV/mm, display speed of 57mm/sec with a 60Hz  notched filter applied as appropriate. EEG data were recorded continuously and digitally stored.  Video monitoring was available and reviewed as appropriate. Description: The posterior dominant rhythm consists of 8-9 Hz activity of moderate voltage (25-35 uV) seen predominantly in posterior head regions, symmetric and reactive to eye opening and eye closing. Sleep was characterized by vertex waves, sleep spindles (12 to 14 Hz), maximal frontocentral  region. There is an excessive amount of 15 to 18 Hz beta activity distributed symmetrically and diffusely.  Hyperventilation and photic stimulation were not performed.   ABNORMALITY - Excessive beta, generalized IMPRESSION: This study is within normal limits. The excessive beta activity seen in the background is most likely due to the effect of benzodiazepine and is a benign EEG pattern. No seizures or epileptiform discharges were seen throughout the recording. A normal interictal EEG does not exclude the diagnosis of epilepsy. Charlsie Quest   DG Chest Port 1 View  Result Date: 01/21/2023 CLINICAL DATA:  67 year old male with history of fever. Possible seizure at home. EXAM: PORTABLE CHEST 1 VIEW COMPARISON:  Chest x-ray 08/31/2021. FINDINGS: Lung volumes are normal. Patchy areas of interstitial prominence, peribronchial cuffing and ill-defined airspace consolidation are noted, most evident in the left upper lobe. Lower lungs appear well aerated. No pleural effusions. No pneumothorax. No evidence of pulmonary edema. Heart size is normal. Upper mediastinal contours are within normal limits. IMPRESSION: 1. The appearance of the chest is suggestive of bronchitis with developing left upper lobe bronchopneumonia. Followup PA and lateral chest X-ray is recommended in 3-4 weeks following trial of antibiotic therapy to ensure resolution and exclude underlying malignancy. Electronically Signed   By: Trudie Reed M.D.   On: 01/21/2023 06:00   CT HEAD WO CONTRAST ( )  Result Date: 01/21/2023 CLINICAL DATA:  Recent seizure disorder EXAM: CT HEAD WITHOUT CONTRAST TECHNIQUE: Contiguous axial images were obtained from the base of the skull through the vertex without intravenous contrast. RADIATION DOSE REDUCTION: This exam was performed according to the departmental dose-optimization program which includes automated exposure control, adjustment of the mA and/or kV according to patient size and/or use of iterative  reconstruction technique. COMPARISON:  08/31/2021 FINDINGS: Brain: No evidence of acute infarction, hemorrhage, hydrocephalus, extra-axial collection or mass lesion/mass effect. There are changes consistent with prior left MCA infarct. Vascular: Prominent dural vein is noted on the right stable from multiple previous exams dating back to 2022. Skull: Normal. Negative for fracture or focal lesion. Sinuses/Orbits: No acute finding. Other: None. IMPRESSION: No acute intracranial abnormality noted. Electronically Signed   By: Alcide Clever M.D.   On: 01/21/2023 02:06    Labs: BNP (last 3  results) No results for input(s): "BNP" in the last 8760 hours. Basic Metabolic Panel: Recent Labs  Lab 01/20/23 2344 01/21/23 0926 01/22/23 0415  NA 137 138 135  K 3.7 3.3* 3.8  CL 103 105 105  CO2 23 24 22   GLUCOSE 109* 99 80  BUN 6* 6* 8  CREATININE 0.89 0.97 0.98  CALCIUM 8.6* 8.2* 8.2*  MG 1.8 1.7  --    Liver Function Tests: Recent Labs  Lab 01/20/23 2344 01/21/23 0926 01/22/23 0415  AST 20 20 23   ALT 12 13 12   ALKPHOS 103 100 89  BILITOT 0.5 1.0 1.3*  PROT 6.6 6.1* 5.6*  ALBUMIN 3.2* 3.0* 2.8*   No results for input(s): "LIPASE", "AMYLASE" in the last 168 hours. No results for input(s): "AMMONIA" in the last 168 hours. CBC: Recent Labs  Lab 01/20/23 2344 01/21/23 0926 01/22/23 0415  WBC 10.1 10.1 7.7  HGB 13.7 12.9* 12.2*  HCT 40.5 36.3* 34.4*  MCV 94.0 93.3 92.5  PLT 197 185 179  Lipid Profile Recent Labs    01/21/23 0926  CHOL 133  HDL 35*  LDLCALC 88  TRIG 48  CHOLHDL 3.8  Thyroid function studies Recent Labs    01/21/23 0926  TSH 1.029  Anemia work up No results for input(s): "VITAMINB12", "FOLATE", "FERRITIN", "TIBC", "IRON", "RETICCTPCT" in the last 72 hours. Urinalysis    Component Value Date/Time   COLORURINE YELLOW 01/21/2023 1210   APPEARANCEUR HAZY (A) 01/21/2023 1210   LABSPEC 1.026 01/21/2023 1210   PHURINE 7.0 01/21/2023 1210   GLUCOSEU NEGATIVE  01/21/2023 1210   HGBUR NEGATIVE 01/21/2023 1210   BILIRUBINUR NEGATIVE 01/21/2023 1210   KETONESUR NEGATIVE 01/21/2023 1210   PROTEINUR 30 (A) 01/21/2023 1210   UROBILINOGEN 1.0 04/11/2013 1924   NITRITE NEGATIVE 01/21/2023 1210   LEUKOCYTESUR MODERATE (A) 01/21/2023 1210   Sepsis Labs Recent Labs  Lab 01/20/23 2344 01/21/23 0926 01/22/23 0415  WBC 10.1 10.1 7.7   Microbiology Recent Results (from the past 240 hour(s))  SARS Coronavirus 2 by RT PCR (hospital order, performed in [facility] hospital lab) *cepheid single result test* Anterior Nasal Swab     Status: None   Collection Time: 01/21/23  5:57 AM   Specimen: Anterior Nasal Swab  Result Value Ref Range Status   SARS Coronavirus 2 by RT PCR NEGATIVE NEGATIVE Final    Comment: Performed at [facility], [address] [place], [place] [address]  MRSA Next Gen by PCR, Nasal     Status: None   Collection Time: 01/21/23  7:30 AM   Specimen: Nasal Mucosa; Nasal Swab  Result Value Ref Range Status   MRSA by PCR Next Gen NOT DETECTED NOT DETECTED Final    Comment: (NOTE) The GeneXpert MRSA Assay (FDA approved for NASAL specimens only), is one component of a comprehensive MRSA colonization surveillance program. It is not intended to diagnose MRSA infection nor to guide or monitor treatment for MRSA infections. Test performance is not FDA approved in patients less than 78 years old. Performed at [facility], [address] [place], [place] [address]    Time coordinating discharge: 25 minutes  SIGNED: Lanae Boast, MD  [facility] 01/23/2023, 9:47 AM  If 7PM-7AM, please contact night-coverage [email or web address]

## 2023-01-23 NOTE — Progress Notes (Signed)
Remains in enteric precautions. Education provided to the patient regarding precaution and isolation. All question is answered.

## 2023-01-23 NOTE — TOC Transition Note (Signed)
Transition of Care [facility]) - CM/SW Discharge Note   Patient Details  Name: Craig Holden MRN: [id number] Date of Birth: May 31, 1955  [facility] CM/SW Contact:  Kermit Balo, RN Phone Number: 01/23/2023, 9:45 AM   Clinical Narrative:    Pt is discharging home with self care. Pt missed some medications at home and is aware not to miss meds again.  Pt has transportation home. [facility]   Final next level of care: Home/Self Care Barriers to Discharge: No Barriers Identified   Patient Goals and CMS Choice      Discharge Placement                         Discharge Plan and Services Additional resources added to the After Visit Summary for                                       Social Determinants of Health (SDOH) Interventions SDOH Screenings   Tobacco Use: High Risk (11/04/2022)   Received from [facility]     Readmission Risk Interventions     No data to display

## 2023-01-23 NOTE — Plan of Care (Signed)
  Problem: Education: Goal: Knowledge of General Education information will improve Description: Including pain rating scale, medication(s)/side effects and non-pharmacologic comfort measures Outcome: Progressing   Problem: Health Behavior/Discharge Planning: Goal: Ability to manage health-related needs will improve Outcome: Progressing   Problem: Activity: Goal: Risk for activity intolerance will decrease Outcome: Progressing   

## 2023-01-24 LAB — LEGIONELLA PNEUMOPHILA SEROGP 1 UR AG: L. pneumophila Serogp 1 Ur Ag: NEGATIVE
